# Patient Record
Sex: Male | Born: 1950 | Race: White | Hispanic: No | Marital: Married | Smoking: Former smoker
Health system: Southern US, Community
[De-identification: ages and names within clinical notes are randomized; demographics above are authoritative.]

## PROBLEM LIST (undated history)

## (undated) DIAGNOSIS — J849 Interstitial pulmonary disease, unspecified: Secondary | ICD-10-CM

## (undated) DIAGNOSIS — I639 Cerebral infarction, unspecified: Secondary | ICD-10-CM

## (undated) DIAGNOSIS — I10 Essential (primary) hypertension: Secondary | ICD-10-CM

## (undated) DIAGNOSIS — K219 Gastro-esophageal reflux disease without esophagitis: Secondary | ICD-10-CM

## (undated) DIAGNOSIS — L709 Acne, unspecified: Secondary | ICD-10-CM

## (undated) DIAGNOSIS — M1990 Unspecified osteoarthritis, unspecified site: Secondary | ICD-10-CM

## (undated) DIAGNOSIS — C4490 Unspecified malignant neoplasm of skin, unspecified: Secondary | ICD-10-CM

## (undated) DIAGNOSIS — E119 Type 2 diabetes mellitus without complications: Secondary | ICD-10-CM

## (undated) DIAGNOSIS — J45909 Unspecified asthma, uncomplicated: Secondary | ICD-10-CM

## (undated) DIAGNOSIS — I2510 Atherosclerotic heart disease of native coronary artery without angina pectoris: Secondary | ICD-10-CM

## (undated) DIAGNOSIS — E785 Hyperlipidemia, unspecified: Secondary | ICD-10-CM

## (undated) HISTORY — DX: Unspecified osteoarthritis, unspecified site: M19.90

## (undated) HISTORY — DX: Type 2 diabetes mellitus without complications: E11.9

## (undated) HISTORY — DX: Atherosclerotic heart disease of native coronary artery without angina pectoris: I25.10

## (undated) HISTORY — PX: CORONARY ANGIOPLASTY: SHX604

## (undated) HISTORY — DX: Gastro-esophageal reflux disease without esophagitis: K21.9

## (undated) HISTORY — DX: Hyperlipidemia, unspecified: E78.5

## (undated) HISTORY — DX: Essential (primary) hypertension: I10

## (undated) HISTORY — PX: TONSILLECTOMY: SUR1361

## (undated) HISTORY — PX: COLONOSCOPY: SHX174

## (undated) HISTORY — DX: Acne, unspecified: L70.9

## (undated) HISTORY — DX: Unspecified asthma, uncomplicated: J45.909

## (undated) HISTORY — PX: NOSE SURGERY: SHX723

---

## 1991-03-13 DIAGNOSIS — Z9861 Coronary angioplasty status: Secondary | ICD-10-CM

## 1991-03-13 HISTORY — PX: CORONARY ANGIOPLASTY: SHX604

## 1991-03-13 HISTORY — DX: Coronary angioplasty status: Z98.61

## 2000-04-08 ENCOUNTER — Ambulatory Visit (HOSPITAL_COMMUNITY): Admission: RE | Admit: 2000-04-08 | Discharge: 2000-04-08 | Payer: Self-pay | Admitting: Gastroenterology

## 2000-04-08 ENCOUNTER — Encounter (INDEPENDENT_AMBULATORY_CARE_PROVIDER_SITE_OTHER): Payer: Self-pay | Admitting: Specialist

## 2006-10-05 ENCOUNTER — Encounter: Admission: RE | Admit: 2006-10-05 | Discharge: 2006-10-05 | Payer: Self-pay | Admitting: Family Medicine

## 2009-12-07 ENCOUNTER — Emergency Department (HOSPITAL_COMMUNITY): Admission: EM | Admit: 2009-12-07 | Discharge: 2009-12-07 | Payer: Self-pay | Admitting: Emergency Medicine

## 2010-04-02 ENCOUNTER — Encounter: Payer: Self-pay | Admitting: Family Medicine

## 2010-05-25 LAB — BASIC METABOLIC PANEL
Chloride: 101 mEq/L (ref 96–112)
GFR calc Af Amer: >60 mL/min (ref >60)
Potassium: 4.3 mEq/L (ref 3.5–5.1)

## 2010-05-25 LAB — CBC
HCT: 47.9 % (ref 39.0–52.0)
MCV: 93.2 fL (ref 78.0–100.0)
RBC: 5.14 MIL/uL (ref 4.22–5.81)
WBC: 14.5 10*3/uL — ABNORMAL HIGH (ref 4.0–10.5)

## 2010-05-25 LAB — DIFFERENTIAL
Eosinophils Relative: 0 % (ref 0–5)
Lymphocytes Relative: 16 % (ref 12–46)
Lymphs Abs: 2.4 10*3/uL (ref 0.7–4.0)
Monocytes Absolute: 0.6 10*3/uL (ref 0.1–1.0)

## 2010-07-28 NOTE — Procedures (Signed)
[place]  Patient:    Christopher Oconnell, Christopher Oconnell                   MRN: [id number] Proc. Date: 04/08/00 Attending:  Petra Kuba, M.D. CC:         Darden Palmer., M.D.  Abran Cantor. Clovis Riley, MD   Procedure Report  PROCEDURE PERFORMED:  Esophagogastroduodenoscopy.  ENDOSCOPIST:  Petra Kuba, M.D.  INDICATIONS FOR PROCEDURE:  Guaiac positivity.  Nondiagnostic colonoscopy. Consent was signed after risks, benefits, methods, and options were thoroughly discussed in the office prior to this procedure before any premeds given.  MEDICATIONS USED:  Additional medicines for this procedure were Demerol 20 mg, Versed 3.  DESCRIPTION OF PROCEDURE:  Video endoscope was inserted by direct vision.  The esophagus was normal.  Scope was inserted into the stomach, advanced through a normal pylorus into a normal duodenal bulb and around the C-loop to a normal second and probably a third part of the duodenum.  Scope was slowly withdrawn back to the bulb.  No findings were seen in the upper duodenum.  Scope was withdrawn back to the stomach and retroflexed. High in the cardia, a tiny hiatal hernia was confirmed.  The angularis, fundus, lesser and greater curve on retroflexion was normal.  Scope was straightened and straight visualization of the stomach did reveal some minimal gastritis but no other abnormality. The scope was then slowly withdrawn.  Again, the tiny hiatal hernia was confirmed.  The esophagus was normal.  The scope was removed.  The patient tolerated the procedure well.  There was no evidence of any complication.  ENDOSCOPIC DIAGNOSIS: 1. Tiny hiatal hernia. 2. Minimal gastritis. 3. Otherwise normal esophagogastroduodenoscopy to the second or third part    of the duodenum.  PLAN:  Follow-up p.r.n. or in six to eight weeks to recheck guaiacs off aspirin and nonsteroidals and make sure no further work-up plans are needed and recheck symptoms  as well. DD:  04/08/00 TD:  04/08/00 Job: [id number]

## 2010-11-23 ENCOUNTER — Inpatient Hospital Stay (HOSPITAL_BASED_OUTPATIENT_CLINIC_OR_DEPARTMENT_OTHER)
Admission: RE | Admit: 2010-11-23 | Discharge: 2010-11-23 | Disposition: A | Discharge status: Home / Self Care | Payer: BC Managed Care – PPO | Source: Ambulatory Visit | Attending: Cardiology | Admitting: Cardiology

## 2010-11-23 DIAGNOSIS — R0989 Other specified symptoms and signs involving the circulatory and respiratory systems: Secondary | ICD-10-CM | POA: Insufficient documentation

## 2010-11-23 DIAGNOSIS — R079 Chest pain, unspecified: Secondary | ICD-10-CM | POA: Insufficient documentation

## 2010-11-23 DIAGNOSIS — R0609 Other forms of dyspnea: Secondary | ICD-10-CM | POA: Insufficient documentation

## 2010-11-23 DIAGNOSIS — I2510 Atherosclerotic heart disease of native coronary artery without angina pectoris: Secondary | ICD-10-CM | POA: Insufficient documentation

## 2010-11-23 DIAGNOSIS — R5381 Other malaise: Secondary | ICD-10-CM | POA: Insufficient documentation

## 2010-11-29 ENCOUNTER — Inpatient Hospital Stay (HOSPITAL_BASED_OUTPATIENT_CLINIC_OR_DEPARTMENT_OTHER): Admission: RE | Admit: 2010-11-29 | Payer: Self-pay | Source: Ambulatory Visit | Admitting: Cardiology

## 2010-12-13 NOTE — Cardiovascular Report (Signed)
  Christopher Oconnell, Christopher Oconnell NO.:  [id number]  MEDICAL RECORD NO.:  [id number]  LOCATION:                                 FACILITY:  PHYSICIAN:  Georga Hacking, M.D.DATE OF BIRTH:  Dec 22, 1950  DATE OF PROCEDURE:  11/23/2010                            CARDIAC CATHETERIZATION   HISTORY:  A 60 year old male has had dyspnea and some chest burning.  He developed worsening dyspnea on exertion, malaise and fatigue, and mild chest burning occurring at rest and relieved with antacids.  Does not have exertional chest tightness.  Was brought in for catheterization.  PROCEDURE:  Left heart catheterization with coronary angiogram and left ventriculogram.  DESCRIPTION OF PROCEDURE:  The patient was brought to the outpatient diagnostic laboratory and was prepped and draped in usual manner.  After Xylocaine anesthesia, a 4-French sheath was placed in the right femoral percutaneously with a single anterior needle wall stick.  Angiograms were made using 4-French catheters and ventriculogram was performed.  He had sheath removed in the holding area and was stable without complications.  HEMODYNAMIC DATA:  Aorta postcontrast 131/67, LV postcontrast 131/11-16.  ANGIOGRAPHIC DATA:  Left ventriculogram:  Performed in the 30-degree RAO projection.  The aortic valve is normal.  The mitral valve is normal. Left ventricle is normal in size.  Estimated ejection fraction is 60%.  Coronary arteries:  Arise and distribute normally.  There is mild calcification noted in the left coronary system.  The left main coronary artery is normal.  Left anterior descending has calcification and mild irregularities proximally, but no significant obstructive stenoses are noted.  The diagonal branch has some disease noted in the small branch of a twin diagonal branch.  The LAD terminates prior to the anterior wall and the diagonal branch appears to be at least the same size as the LAD.  Circumflex  coronary artery:  Calcification proximally.  First marginal branch has mild 40% proximal stenosis.  Circumflex has a segmental eccentric 60% stenosis after the continuation branch of the first marginal branch, which then bifurcates distally.  The right coronary artery is dominant vessel and contains minimal scattered irregularities.  IMPRESSION: 1. Long-term patency of the stented sites to the diagonal branch. 2. Moderately severe stenosis involving the circumflex marginal     branch. 3. Normal left ventricular function.  RECOMMENDATIONS:  Intensive medical therapy at this time, consider stress testing to determine if has ST depression and to assess exercise capacity.     Georga Hacking, M.D.     WST/MEDQ  D:  11/23/2010  T:  11/23/2010  Job:  [id number]  cc:   L. Lupe Carney, M.D.  Electronically Signed by Lacretia Nicks. Donnie Aho M.D. on 12/13/2010 12:37:56 PM

## 2011-08-03 ENCOUNTER — Other Ambulatory Visit: Payer: Self-pay | Admitting: Cardiology

## 2011-11-02 ENCOUNTER — Other Ambulatory Visit: Payer: Self-pay | Admitting: Cardiology

## 2013-09-16 ENCOUNTER — Ambulatory Visit
Admission: RE | Admit: 2013-09-16 | Discharge: 2013-09-16 | Disposition: A | Discharge status: Home / Self Care | Payer: 59 | Source: Ambulatory Visit | Attending: Family Medicine | Admitting: Family Medicine

## 2013-09-16 ENCOUNTER — Other Ambulatory Visit: Payer: Self-pay | Admitting: Family Medicine

## 2013-09-16 DIAGNOSIS — R059 Cough, unspecified: Secondary | ICD-10-CM

## 2013-09-16 DIAGNOSIS — R05 Cough: Secondary | ICD-10-CM

## 2013-09-16 DIAGNOSIS — R200 Anesthesia of skin: Secondary | ICD-10-CM

## 2013-09-22 ENCOUNTER — Ambulatory Visit
Admission: RE | Admit: 2013-09-22 | Discharge: 2013-09-22 | Disposition: A | Discharge status: Home / Self Care | Payer: 59 | Source: Ambulatory Visit | Attending: Family Medicine | Admitting: Family Medicine

## 2013-09-22 DIAGNOSIS — R200 Anesthesia of skin: Secondary | ICD-10-CM

## 2013-10-29 ENCOUNTER — Encounter: Payer: BC Managed Care – PPO | Admitting: Neurology

## 2015-03-29 ENCOUNTER — Other Ambulatory Visit: Payer: Self-pay | Admitting: Family Medicine

## 2015-03-29 ENCOUNTER — Ambulatory Visit
Admission: RE | Admit: 2015-03-29 | Discharge: 2015-03-29 | Disposition: A | Discharge status: Home / Self Care | Payer: PPO | Source: Ambulatory Visit | Attending: Family Medicine | Admitting: Family Medicine

## 2015-03-29 DIAGNOSIS — I10 Essential (primary) hypertension: Secondary | ICD-10-CM | POA: Diagnosis not present

## 2015-03-29 DIAGNOSIS — R109 Unspecified abdominal pain: Secondary | ICD-10-CM

## 2015-03-29 DIAGNOSIS — E782 Mixed hyperlipidemia: Secondary | ICD-10-CM | POA: Diagnosis not present

## 2015-03-29 DIAGNOSIS — E119 Type 2 diabetes mellitus without complications: Secondary | ICD-10-CM | POA: Diagnosis not present

## 2015-05-09 ENCOUNTER — Other Ambulatory Visit: Payer: Self-pay | Admitting: Gastroenterology

## 2015-05-09 DIAGNOSIS — Z86010 Personal history of colonic polyps: Secondary | ICD-10-CM | POA: Diagnosis not present

## 2015-05-09 DIAGNOSIS — D122 Benign neoplasm of ascending colon: Secondary | ICD-10-CM | POA: Diagnosis not present

## 2015-05-09 DIAGNOSIS — D120 Benign neoplasm of cecum: Secondary | ICD-10-CM | POA: Diagnosis not present

## 2015-05-09 DIAGNOSIS — D124 Benign neoplasm of descending colon: Secondary | ICD-10-CM | POA: Diagnosis not present

## 2015-05-09 DIAGNOSIS — D125 Benign neoplasm of sigmoid colon: Secondary | ICD-10-CM | POA: Diagnosis not present

## 2015-05-09 DIAGNOSIS — K621 Rectal polyp: Secondary | ICD-10-CM | POA: Diagnosis not present

## 2015-06-21 DIAGNOSIS — I2510 Atherosclerotic heart disease of native coronary artery without angina pectoris: Secondary | ICD-10-CM | POA: Diagnosis not present

## 2015-06-21 DIAGNOSIS — Z9861 Coronary angioplasty status: Secondary | ICD-10-CM | POA: Diagnosis not present

## 2015-06-21 DIAGNOSIS — E119 Type 2 diabetes mellitus without complications: Secondary | ICD-10-CM | POA: Diagnosis not present

## 2015-06-21 DIAGNOSIS — E1142 Type 2 diabetes mellitus with diabetic polyneuropathy: Secondary | ICD-10-CM | POA: Diagnosis not present

## 2015-06-21 DIAGNOSIS — E668 Other obesity: Secondary | ICD-10-CM | POA: Diagnosis not present

## 2015-06-21 DIAGNOSIS — E785 Hyperlipidemia, unspecified: Secondary | ICD-10-CM | POA: Diagnosis not present

## 2015-06-21 DIAGNOSIS — I10 Essential (primary) hypertension: Secondary | ICD-10-CM | POA: Diagnosis not present

## 2015-09-03 IMAGING — CR DG CHEST 2V
2 series · 2 of 2 positions shown · non-contrast
Comparison: November 16, 2010

CLINICAL DATA: Cough

EXAM:
CHEST  2 VIEW

[w chest pa]
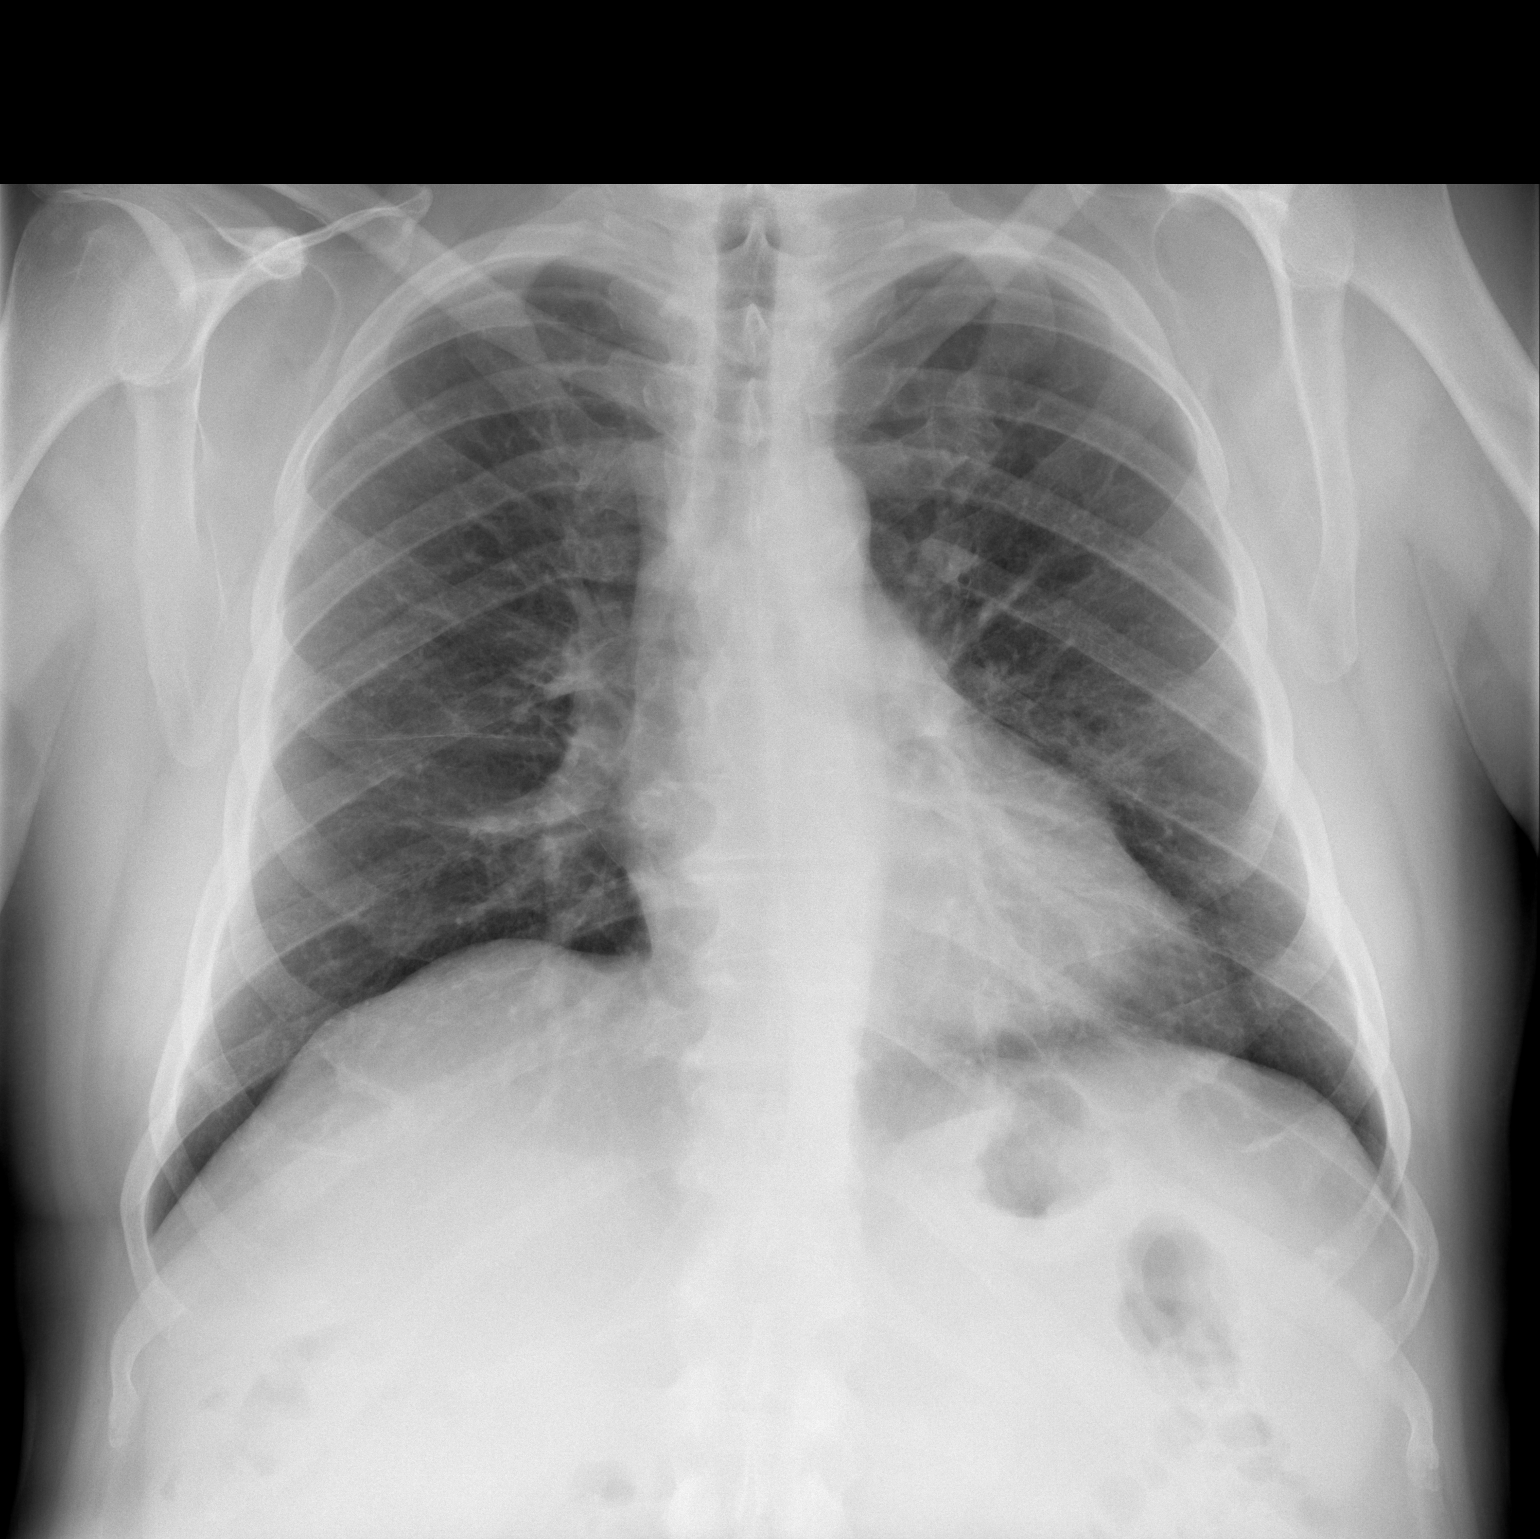

[w chest lat]
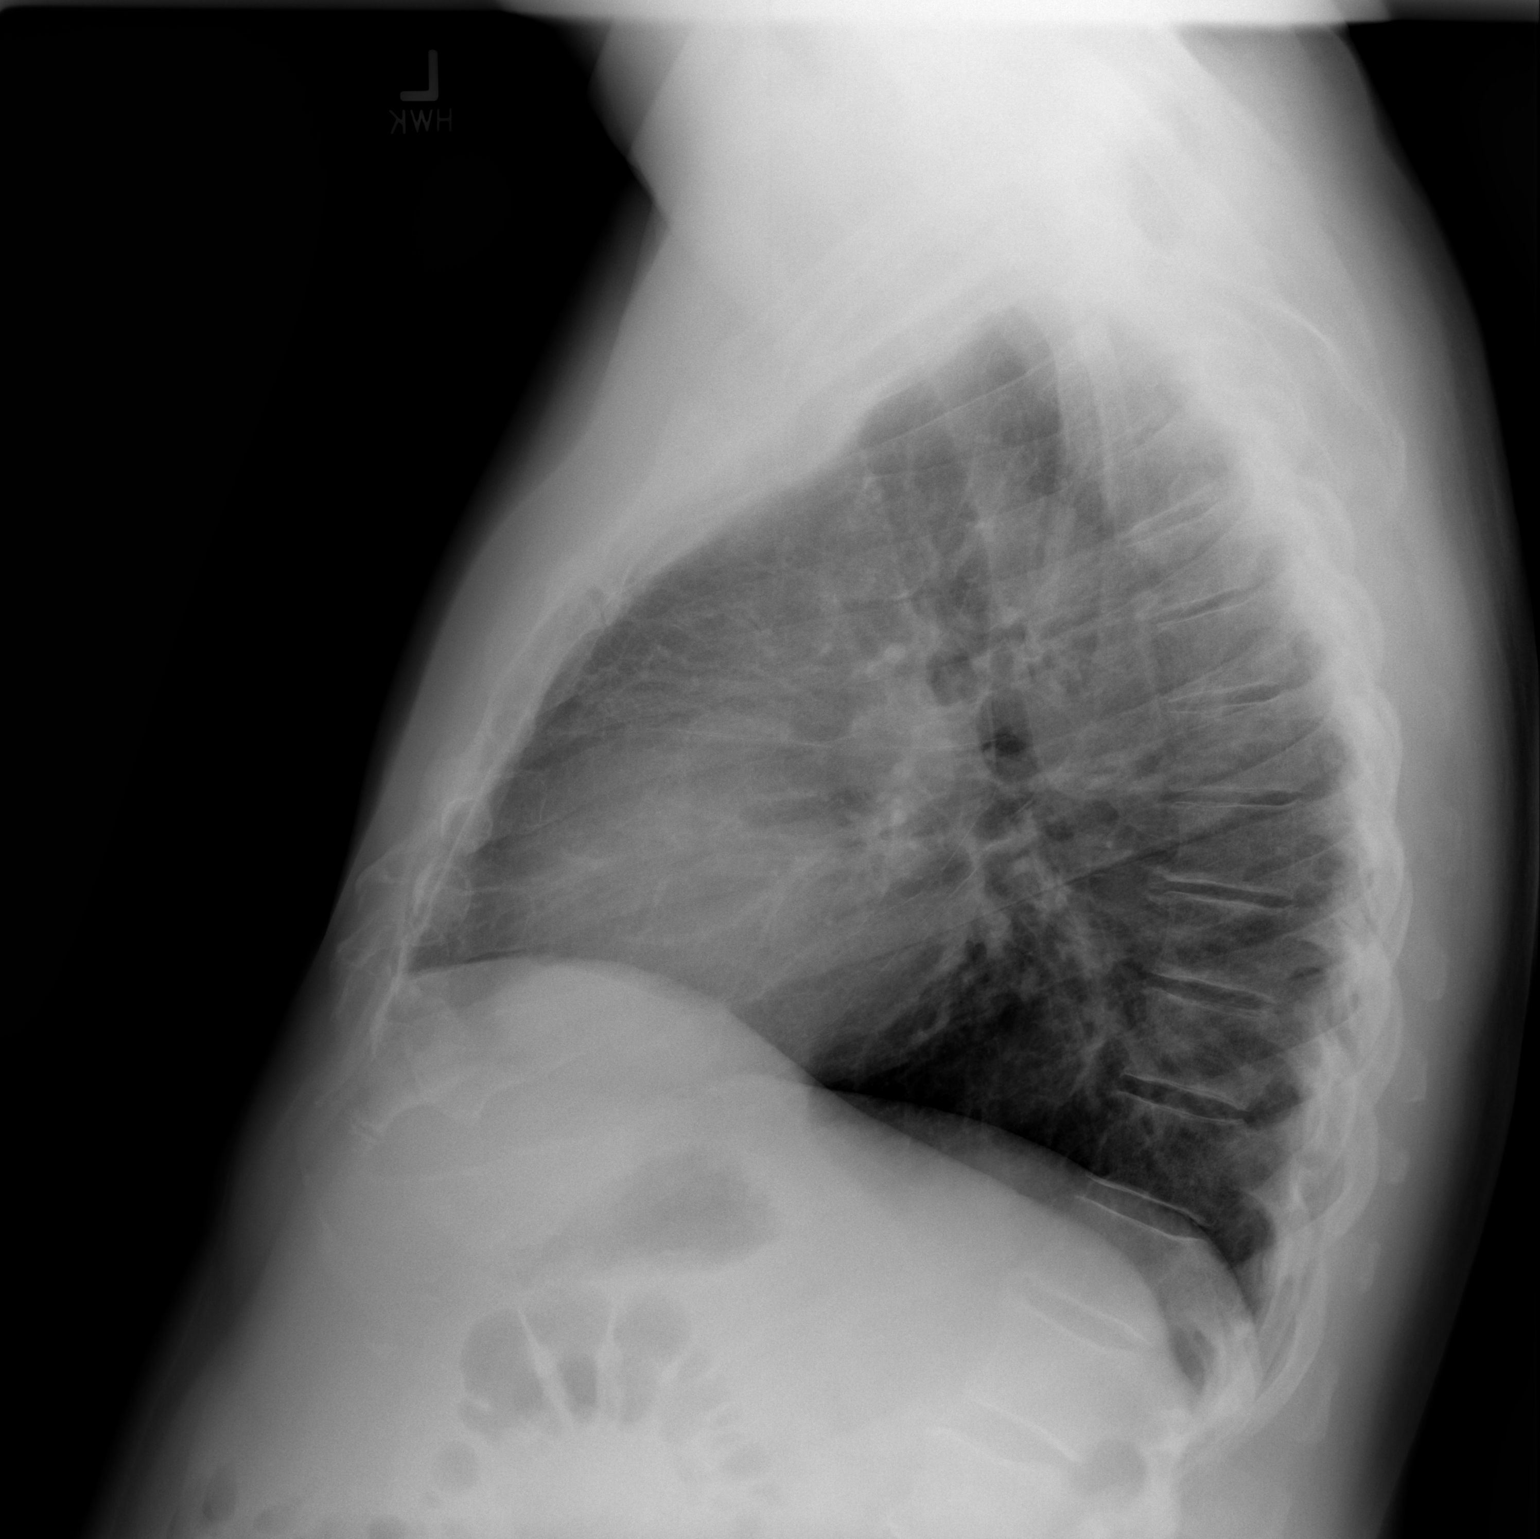

[2 of 2 positions shown; findings below may reference images not displayed]

FINDINGS: There is no edema or consolidation. The heart size and pulmonary
vascularity are normal. No adenopathy. No bone lesions.
IMPRESSION: No edema or consolidation.

## 2015-10-21 DIAGNOSIS — E119 Type 2 diabetes mellitus without complications: Secondary | ICD-10-CM | POA: Diagnosis not present

## 2015-10-21 DIAGNOSIS — Z125 Encounter for screening for malignant neoplasm of prostate: Secondary | ICD-10-CM | POA: Diagnosis not present

## 2015-10-21 DIAGNOSIS — I10 Essential (primary) hypertension: Secondary | ICD-10-CM | POA: Diagnosis not present

## 2015-10-21 DIAGNOSIS — N529 Male erectile dysfunction, unspecified: Secondary | ICD-10-CM | POA: Diagnosis not present

## 2015-10-21 DIAGNOSIS — E782 Mixed hyperlipidemia: Secondary | ICD-10-CM | POA: Diagnosis not present

## 2015-10-21 DIAGNOSIS — Z0000 Encounter for general adult medical examination without abnormal findings: Secondary | ICD-10-CM | POA: Diagnosis not present

## 2015-10-21 DIAGNOSIS — E669 Obesity, unspecified: Secondary | ICD-10-CM | POA: Diagnosis not present

## 2015-12-20 DIAGNOSIS — N5201 Erectile dysfunction due to arterial insufficiency: Secondary | ICD-10-CM | POA: Diagnosis not present

## 2016-03-29 DIAGNOSIS — N5201 Erectile dysfunction due to arterial insufficiency: Secondary | ICD-10-CM | POA: Diagnosis not present

## 2016-03-29 DIAGNOSIS — Z125 Encounter for screening for malignant neoplasm of prostate: Secondary | ICD-10-CM | POA: Diagnosis not present

## 2016-04-26 DIAGNOSIS — I10 Essential (primary) hypertension: Secondary | ICD-10-CM | POA: Diagnosis not present

## 2016-04-26 DIAGNOSIS — E119 Type 2 diabetes mellitus without complications: Secondary | ICD-10-CM | POA: Diagnosis not present

## 2016-04-26 DIAGNOSIS — E782 Mixed hyperlipidemia: Secondary | ICD-10-CM | POA: Diagnosis not present

## 2016-04-26 DIAGNOSIS — K5900 Constipation, unspecified: Secondary | ICD-10-CM | POA: Diagnosis not present

## 2016-06-19 DIAGNOSIS — I10 Essential (primary) hypertension: Secondary | ICD-10-CM | POA: Diagnosis not present

## 2016-06-19 DIAGNOSIS — I2510 Atherosclerotic heart disease of native coronary artery without angina pectoris: Secondary | ICD-10-CM | POA: Diagnosis not present

## 2016-06-19 DIAGNOSIS — E668 Other obesity: Secondary | ICD-10-CM | POA: Diagnosis not present

## 2016-06-19 DIAGNOSIS — E1142 Type 2 diabetes mellitus with diabetic polyneuropathy: Secondary | ICD-10-CM | POA: Diagnosis not present

## 2016-06-19 DIAGNOSIS — Z9861 Coronary angioplasty status: Secondary | ICD-10-CM | POA: Diagnosis not present

## 2016-06-19 DIAGNOSIS — E785 Hyperlipidemia, unspecified: Secondary | ICD-10-CM | POA: Diagnosis not present

## 2016-08-13 DIAGNOSIS — H52223 Regular astigmatism, bilateral: Secondary | ICD-10-CM | POA: Diagnosis not present

## 2016-08-13 DIAGNOSIS — I10 Essential (primary) hypertension: Secondary | ICD-10-CM | POA: Diagnosis not present

## 2016-08-13 DIAGNOSIS — H35033 Hypertensive retinopathy, bilateral: Secondary | ICD-10-CM | POA: Diagnosis not present

## 2016-08-13 DIAGNOSIS — H524 Presbyopia: Secondary | ICD-10-CM | POA: Diagnosis not present

## 2016-08-13 DIAGNOSIS — E119 Type 2 diabetes mellitus without complications: Secondary | ICD-10-CM | POA: Diagnosis not present

## 2016-10-09 DIAGNOSIS — K5900 Constipation, unspecified: Secondary | ICD-10-CM | POA: Diagnosis not present

## 2016-10-09 DIAGNOSIS — R140 Abdominal distension (gaseous): Secondary | ICD-10-CM | POA: Diagnosis not present

## 2016-10-09 DIAGNOSIS — K210 Gastro-esophageal reflux disease with esophagitis: Secondary | ICD-10-CM | POA: Diagnosis not present

## 2016-11-06 DIAGNOSIS — E669 Obesity, unspecified: Secondary | ICD-10-CM | POA: Diagnosis not present

## 2016-11-06 DIAGNOSIS — Z7984 Long term (current) use of oral hypoglycemic drugs: Secondary | ICD-10-CM | POA: Diagnosis not present

## 2016-11-06 DIAGNOSIS — E782 Mixed hyperlipidemia: Secondary | ICD-10-CM | POA: Diagnosis not present

## 2016-11-06 DIAGNOSIS — E1165 Type 2 diabetes mellitus with hyperglycemia: Secondary | ICD-10-CM | POA: Diagnosis not present

## 2016-11-06 DIAGNOSIS — K210 Gastro-esophageal reflux disease with esophagitis: Secondary | ICD-10-CM | POA: Diagnosis not present

## 2016-11-06 DIAGNOSIS — Z1159 Encounter for screening for other viral diseases: Secondary | ICD-10-CM | POA: Diagnosis not present

## 2016-11-06 DIAGNOSIS — I10 Essential (primary) hypertension: Secondary | ICD-10-CM | POA: Diagnosis not present

## 2016-11-06 DIAGNOSIS — Z0000 Encounter for general adult medical examination without abnormal findings: Secondary | ICD-10-CM | POA: Diagnosis not present

## 2016-11-06 LAB — HM HEPATITIS C SCREENING LAB: HM Hepatitis Screen: NEGATIVE

## 2016-11-13 DIAGNOSIS — R140 Abdominal distension (gaseous): Secondary | ICD-10-CM | POA: Diagnosis not present

## 2016-11-13 DIAGNOSIS — K5900 Constipation, unspecified: Secondary | ICD-10-CM | POA: Diagnosis not present

## 2016-11-13 DIAGNOSIS — K210 Gastro-esophageal reflux disease with esophagitis: Secondary | ICD-10-CM | POA: Diagnosis not present

## 2017-01-30 ENCOUNTER — Encounter: Payer: Self-pay | Admitting: Family Medicine

## 2017-01-30 DIAGNOSIS — R1084 Generalized abdominal pain: Secondary | ICD-10-CM | POA: Diagnosis not present

## 2017-01-30 DIAGNOSIS — K319 Disease of stomach and duodenum, unspecified: Secondary | ICD-10-CM | POA: Diagnosis not present

## 2017-01-30 DIAGNOSIS — K2950 Unspecified chronic gastritis without bleeding: Secondary | ICD-10-CM | POA: Diagnosis not present

## 2017-02-05 DIAGNOSIS — K319 Disease of stomach and duodenum, unspecified: Secondary | ICD-10-CM | POA: Diagnosis not present

## 2017-03-15 IMAGING — CR DG ABDOMEN 2V
3 series · 3 of 3 positions shown · non-contrast
Comparison: None.

CLINICAL DATA: Recurrent abdominal pain

EXAM:
ABDOMEN - 2 VIEW

[view not recorded (1 of 3)]
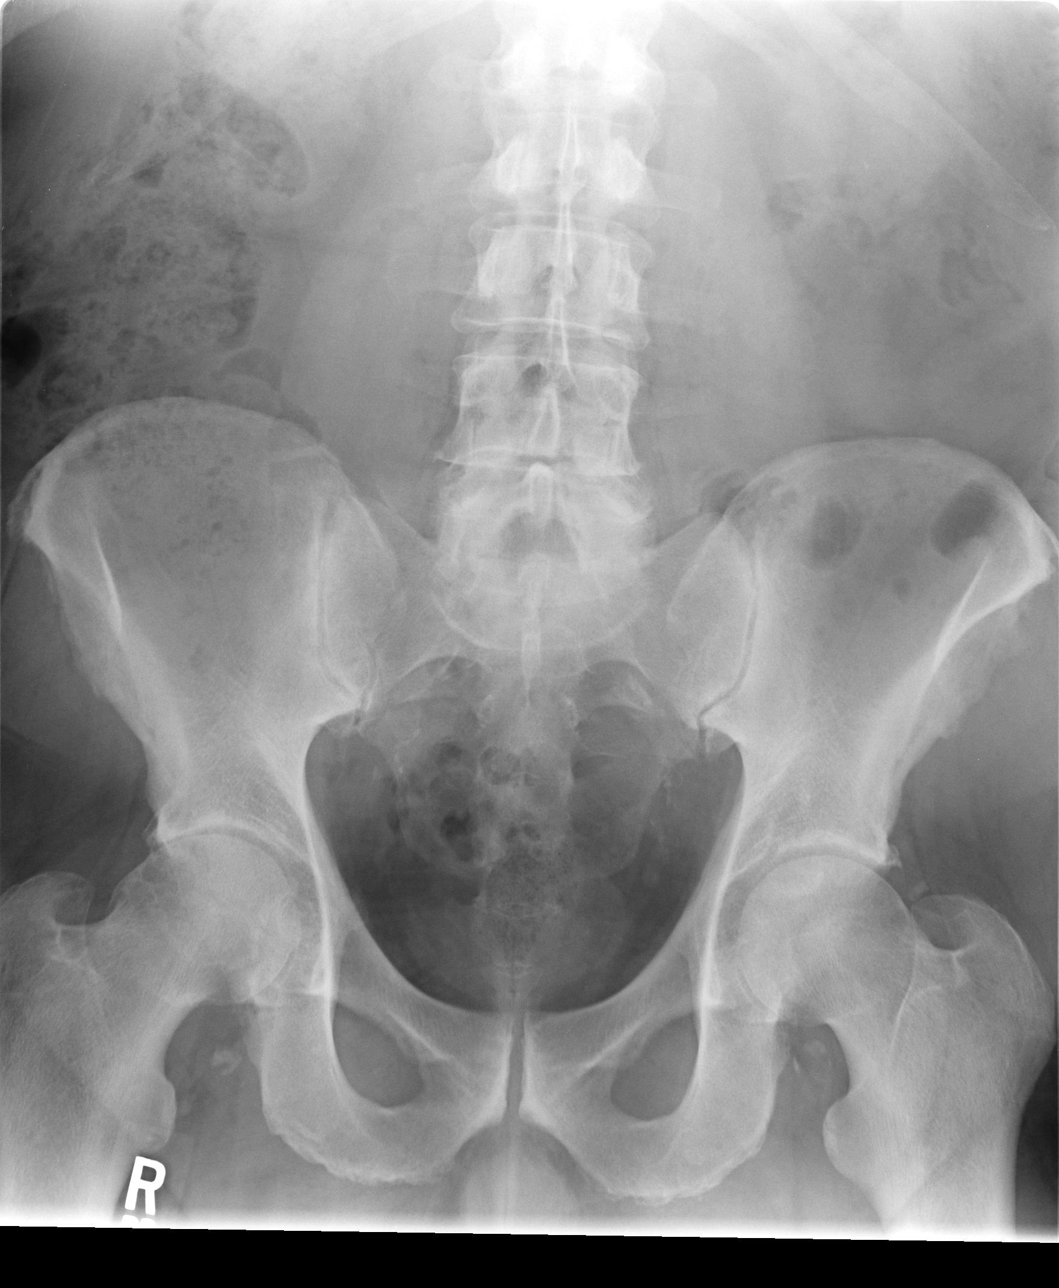

[view not recorded (2 of 3)]
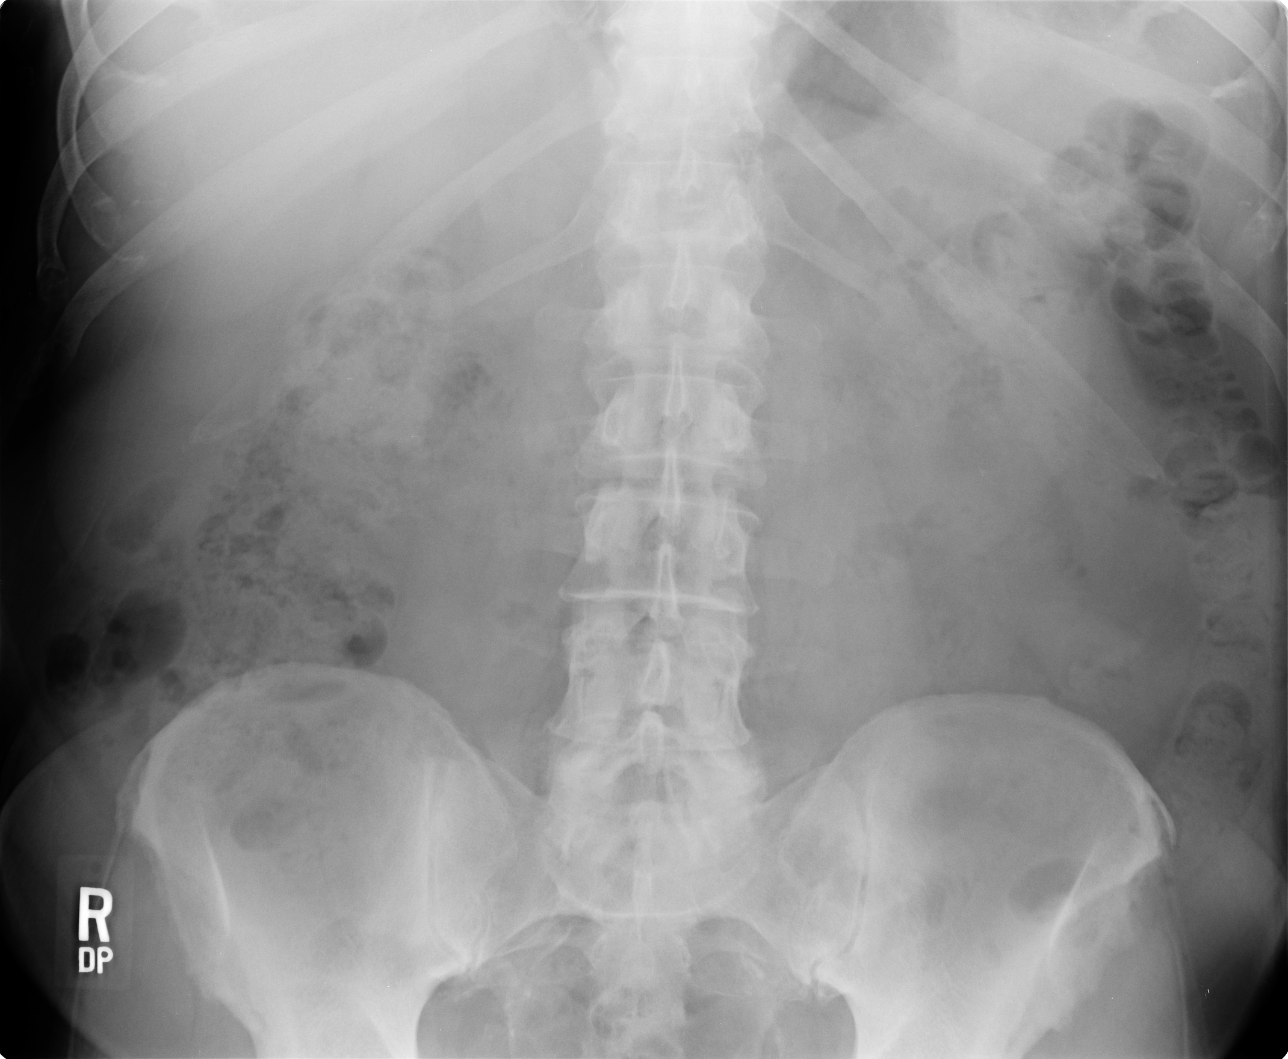

[view not recorded (3 of 3)]
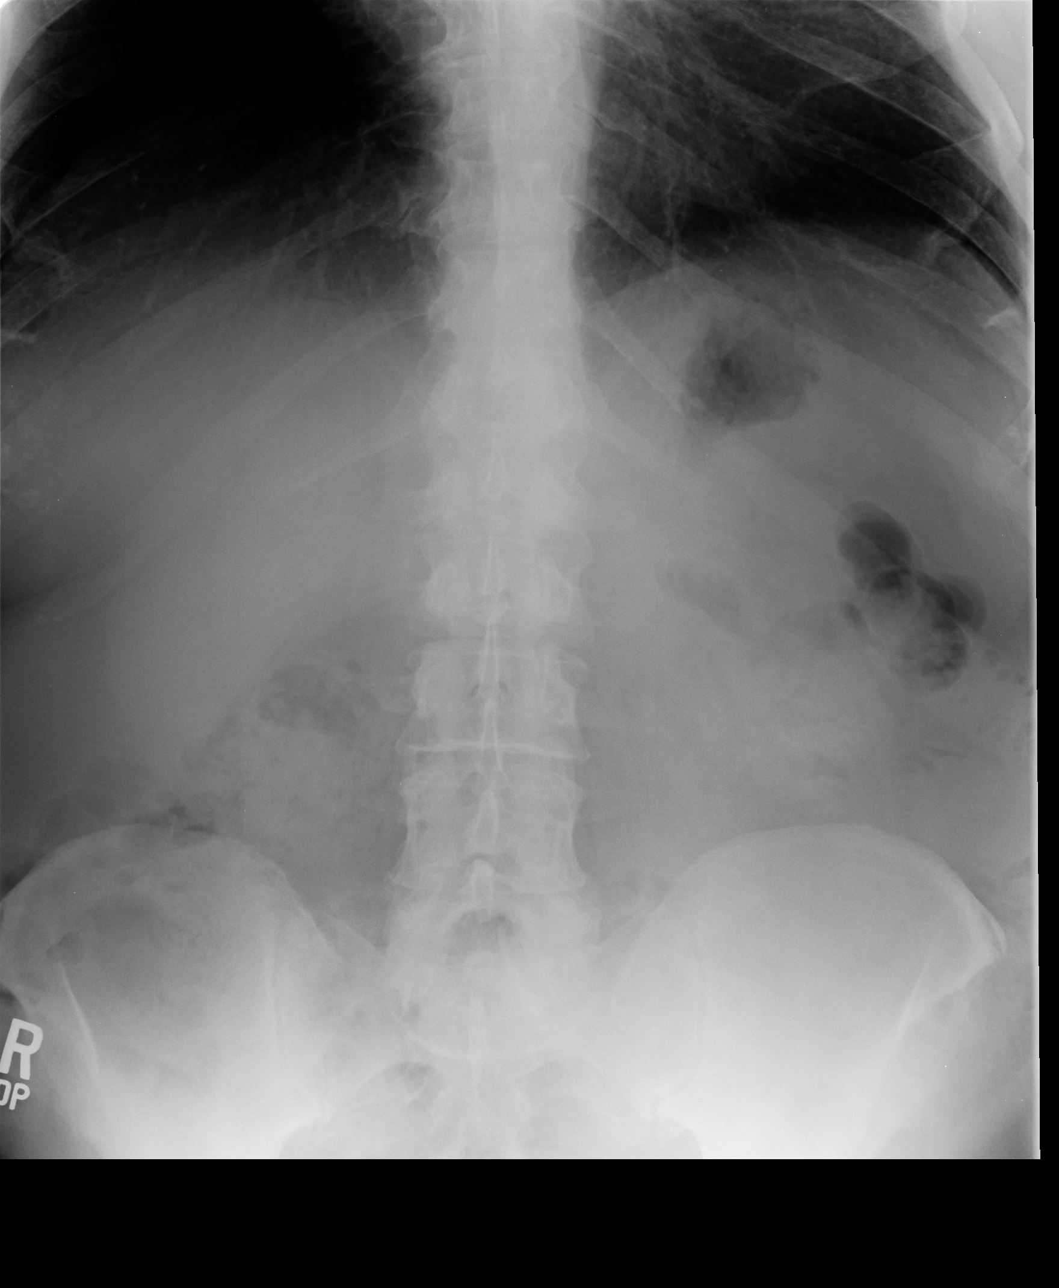

[3 of 3 positions shown; findings below may reference images not displayed]

FINDINGS: There is a moderate amount of stool in the ascending colon. There is
no bowel dilatation to suggest obstruction. There is no evidence of
pneumoperitoneum, portal venous gas or pneumatosis.

There are no pathologic calcifications along the expected course of
the ureters.

There is moderate osteoarthritis of the right hip. There is
peripheral vascular atherosclerotic disease.
IMPRESSION: Moderate amount of stool in the ascending colon.

## 2017-05-13 DIAGNOSIS — E1140 Type 2 diabetes mellitus with diabetic neuropathy, unspecified: Secondary | ICD-10-CM | POA: Diagnosis not present

## 2017-05-13 DIAGNOSIS — K5900 Constipation, unspecified: Secondary | ICD-10-CM | POA: Diagnosis not present

## 2017-05-13 DIAGNOSIS — I10 Essential (primary) hypertension: Secondary | ICD-10-CM | POA: Diagnosis not present

## 2017-05-13 DIAGNOSIS — K210 Gastro-esophageal reflux disease with esophagitis: Secondary | ICD-10-CM | POA: Diagnosis not present

## 2017-05-13 DIAGNOSIS — E782 Mixed hyperlipidemia: Secondary | ICD-10-CM | POA: Diagnosis not present

## 2017-05-13 DIAGNOSIS — D489 Neoplasm of uncertain behavior, unspecified: Secondary | ICD-10-CM | POA: Diagnosis not present

## 2017-05-30 ENCOUNTER — Other Ambulatory Visit: Payer: Self-pay

## 2017-05-30 DIAGNOSIS — D229 Melanocytic nevi, unspecified: Secondary | ICD-10-CM | POA: Diagnosis not present

## 2017-05-30 DIAGNOSIS — D2261 Melanocytic nevi of right upper limb, including shoulder: Secondary | ICD-10-CM | POA: Diagnosis not present

## 2017-05-30 DIAGNOSIS — C44629 Squamous cell carcinoma of skin of left upper limb, including shoulder: Secondary | ICD-10-CM | POA: Diagnosis not present

## 2017-05-30 DIAGNOSIS — D485 Neoplasm of uncertain behavior of skin: Secondary | ICD-10-CM | POA: Diagnosis not present

## 2017-05-30 DIAGNOSIS — L570 Actinic keratosis: Secondary | ICD-10-CM | POA: Diagnosis not present

## 2017-05-30 DIAGNOSIS — D225 Melanocytic nevi of trunk: Secondary | ICD-10-CM | POA: Diagnosis not present

## 2017-06-20 ENCOUNTER — Other Ambulatory Visit: Payer: Self-pay | Admitting: Dermatology

## 2017-06-20 DIAGNOSIS — D485 Neoplasm of uncertain behavior of skin: Secondary | ICD-10-CM | POA: Diagnosis not present

## 2017-06-20 DIAGNOSIS — L988 Other specified disorders of the skin and subcutaneous tissue: Secondary | ICD-10-CM | POA: Diagnosis not present

## 2017-06-21 DIAGNOSIS — I2510 Atherosclerotic heart disease of native coronary artery without angina pectoris: Secondary | ICD-10-CM | POA: Diagnosis not present

## 2017-06-21 DIAGNOSIS — E785 Hyperlipidemia, unspecified: Secondary | ICD-10-CM | POA: Diagnosis not present

## 2017-06-21 DIAGNOSIS — Z9861 Coronary angioplasty status: Secondary | ICD-10-CM | POA: Diagnosis not present

## 2017-06-21 DIAGNOSIS — E668 Other obesity: Secondary | ICD-10-CM | POA: Diagnosis not present

## 2017-06-21 DIAGNOSIS — E1142 Type 2 diabetes mellitus with diabetic polyneuropathy: Secondary | ICD-10-CM | POA: Diagnosis not present

## 2017-06-21 DIAGNOSIS — I10 Essential (primary) hypertension: Secondary | ICD-10-CM | POA: Diagnosis not present

## 2017-08-30 DIAGNOSIS — H524 Presbyopia: Secondary | ICD-10-CM | POA: Diagnosis not present

## 2017-08-30 DIAGNOSIS — H0102A Squamous blepharitis right eye, upper and lower eyelids: Secondary | ICD-10-CM | POA: Diagnosis not present

## 2017-08-30 DIAGNOSIS — H52223 Regular astigmatism, bilateral: Secondary | ICD-10-CM | POA: Diagnosis not present

## 2017-08-30 DIAGNOSIS — E119 Type 2 diabetes mellitus without complications: Secondary | ICD-10-CM | POA: Diagnosis not present

## 2017-08-30 DIAGNOSIS — H5203 Hypermetropia, bilateral: Secondary | ICD-10-CM | POA: Diagnosis not present

## 2017-08-30 DIAGNOSIS — H0102B Squamous blepharitis left eye, upper and lower eyelids: Secondary | ICD-10-CM | POA: Diagnosis not present

## 2017-11-20 DIAGNOSIS — K5900 Constipation, unspecified: Secondary | ICD-10-CM | POA: Diagnosis not present

## 2017-11-20 DIAGNOSIS — E1140 Type 2 diabetes mellitus with diabetic neuropathy, unspecified: Secondary | ICD-10-CM | POA: Diagnosis not present

## 2017-11-20 DIAGNOSIS — S91309A Unspecified open wound, unspecified foot, initial encounter: Secondary | ICD-10-CM | POA: Diagnosis not present

## 2017-11-20 DIAGNOSIS — Z7984 Long term (current) use of oral hypoglycemic drugs: Secondary | ICD-10-CM | POA: Diagnosis not present

## 2017-11-20 DIAGNOSIS — E782 Mixed hyperlipidemia: Secondary | ICD-10-CM | POA: Diagnosis not present

## 2017-11-20 DIAGNOSIS — I10 Essential (primary) hypertension: Secondary | ICD-10-CM | POA: Diagnosis not present

## 2018-04-10 DIAGNOSIS — L570 Actinic keratosis: Secondary | ICD-10-CM | POA: Diagnosis not present

## 2018-05-22 DIAGNOSIS — I10 Essential (primary) hypertension: Secondary | ICD-10-CM | POA: Diagnosis not present

## 2018-05-22 DIAGNOSIS — E782 Mixed hyperlipidemia: Secondary | ICD-10-CM | POA: Diagnosis not present

## 2018-05-22 DIAGNOSIS — M2010 Hallux valgus (acquired), unspecified foot: Secondary | ICD-10-CM | POA: Diagnosis not present

## 2018-05-22 DIAGNOSIS — Z125 Encounter for screening for malignant neoplasm of prostate: Secondary | ICD-10-CM | POA: Diagnosis not present

## 2018-05-22 DIAGNOSIS — K5900 Constipation, unspecified: Secondary | ICD-10-CM | POA: Diagnosis not present

## 2018-05-22 DIAGNOSIS — E1140 Type 2 diabetes mellitus with diabetic neuropathy, unspecified: Secondary | ICD-10-CM | POA: Diagnosis not present

## 2018-05-22 DIAGNOSIS — I2510 Atherosclerotic heart disease of native coronary artery without angina pectoris: Secondary | ICD-10-CM | POA: Diagnosis not present

## 2018-05-22 LAB — LIPID PANEL
Cholesterol: 163 (ref 0–200)
HDL: 39 (ref 35–70)
LDL Cholesterol: 89
LDl/HDL Ratio: 4.2
Triglycerides: 176 — AB (ref 40–160)

## 2018-05-22 LAB — HEMOGLOBIN A1C: Hemoglobin A1C: 7.5

## 2018-05-22 LAB — BASIC METABOLIC PANEL
BUN: 12 (ref 4–21)
Creatinine: 1 (ref 0.6–1.3)
Glucose: 147
Potassium: 4.1 (ref 3.4–5.3)
Sodium: 136 — AB (ref 137–147)

## 2018-05-22 LAB — HEPATIC FUNCTION PANEL
ALT: 26 (ref 10–40)
AST: 22 (ref 14–40)

## 2018-05-22 LAB — PSA: PSA: 0.57

## 2018-06-12 ENCOUNTER — Ambulatory Visit: Payer: PPO | Admitting: Cardiology

## 2018-07-16 ENCOUNTER — Telehealth: Payer: Self-pay | Admitting: *Deleted

## 2018-07-16 NOTE — Telephone Encounter (Signed)
Patient scheduled for tomorrow with Buford Dresser. Need to reschedule to a different doctor.  Can be virtual if no current chest pain or symptoms.  No answer on first number and no VM. No answer. Left message to call back on second number listed. Scheduler Caryl Pina will be calling patient again today as well.

## 2018-07-17 ENCOUNTER — Ambulatory Visit: Payer: PPO | Admitting: Cardiology

## 2018-07-24 DIAGNOSIS — H4921 Sixth [abducent] nerve palsy, right eye: Secondary | ICD-10-CM | POA: Diagnosis not present

## 2018-08-21 DIAGNOSIS — E119 Type 2 diabetes mellitus without complications: Secondary | ICD-10-CM | POA: Diagnosis not present

## 2018-12-11 DIAGNOSIS — H4921 Sixth [abducent] nerve palsy, right eye: Secondary | ICD-10-CM | POA: Diagnosis not present

## 2019-02-11 ENCOUNTER — Encounter: Payer: Self-pay | Admitting: Internal Medicine

## 2019-02-11 ENCOUNTER — Other Ambulatory Visit: Payer: Self-pay

## 2019-02-11 ENCOUNTER — Telehealth: Payer: Self-pay | Admitting: Internal Medicine

## 2019-02-11 ENCOUNTER — Ambulatory Visit (INDEPENDENT_AMBULATORY_CARE_PROVIDER_SITE_OTHER): Payer: PPO | Admitting: Internal Medicine

## 2019-02-11 VITALS — BP 136/72 | HR 66 | Ht 70.0 in | Wt 217.5 lb

## 2019-02-11 DIAGNOSIS — E785 Hyperlipidemia, unspecified: Secondary | ICD-10-CM | POA: Insufficient documentation

## 2019-02-11 DIAGNOSIS — I10 Essential (primary) hypertension: Secondary | ICD-10-CM | POA: Insufficient documentation

## 2019-02-11 DIAGNOSIS — I25118 Atherosclerotic heart disease of native coronary artery with other forms of angina pectoris: Secondary | ICD-10-CM | POA: Insufficient documentation

## 2019-02-11 DIAGNOSIS — E1149 Type 2 diabetes mellitus with other diabetic neurological complication: Secondary | ICD-10-CM | POA: Insufficient documentation

## 2019-02-11 DIAGNOSIS — E119 Type 2 diabetes mellitus without complications: Secondary | ICD-10-CM | POA: Diagnosis not present

## 2019-02-11 DIAGNOSIS — I2510 Atherosclerotic heart disease of native coronary artery without angina pectoris: Secondary | ICD-10-CM

## 2019-02-11 NOTE — Telephone Encounter (Signed)
Faxed release request to [facility] for most recent labs .

## 2019-02-11 NOTE — Patient Instructions (Signed)
Medication Instructions:  Your physician recommends that you continue on your current medications as directed. Please refer to the Current Medication list given to you today.  *If you need a refill on your cardiac medications before your next appointment, please call your pharmacy*  Lab Work: Chilton Greathouse will be requested from your primary care physician.   If you have labs (blood work) drawn today and your tests are completely normal, you will receive your results only by: Marland Kitchen MyChart Message (if you have MyChart) OR . A paper copy in the mail If you have any lab test that is abnormal or we need to change your treatment, we will call you to review the results.  Testing/Procedures: NONE  Follow-Up: At [facility], you and your health needs are our priority.  As part of our continuing mission to provide you with exceptional heart care, we have created designated Provider Care Teams.  These Care Teams include your primary Cardiologist (physician) and Advanced Practice Providers (APPs -  Physician Assistants and Nurse Practitioners) who all work together to provide you with the care you need, when you need it.  Your next appointment:   12 month(s)  The format for your next appointment:   In Person  Provider:    You may see DR Harrell Gave END or one of the following Advanced Practice Providers on your designated Care Team:    Murray Hodgkins, NP  Christell Faith, PA-C  Marrianne Mood, PA-C

## 2019-02-11 NOTE — Progress Notes (Signed)
Follow-up Outpatient Visit Date: 02/11/2019  Primary Care Provider: Alroy Dust, L.Marlou Sa, [place] [address] [place] [address]  Chief Complaint: Follow-up coronary artery disease  HPI:  Christopher Oconnell is a 68 y.o. male previously followed by Dr. Wynonia Lawman with history of coronary artery disease status post angioplasty to D1 in 1993, hypertension, hyperlipidemia, and type 2 diabetes mellitus, who presents for follow-up of coronary artery disease.  He last saw Dr. Wynonia Lawman in 06/2017, at which time he was doing well.  Today, Christopher Oconnell reports that he has been without new symptoms.  He has mild exertional dyspnea that has been unchanged for years.  He denies chest pain, palpitations, lightheadedness, orthopnea, PND, and edema.  He is tolerating his current medications well.  Home blood pressures are typically in the 130s/70s.  --------------------------------------------------------------------------------------------------  Past Medical History:  Diagnosis Date  . Coronary artery disease   . Diabetes mellitus without complication ([place])   . Hyperlipidemia   . Hypertension    Past Surgical History:  Procedure Laterality Date  . CORONARY ANGIOPLASTY     No stents  . NOSE SURGERY    . TONSILLECTOMY      Current Meds  Medication Sig  . amLODipine (NORVASC) 5 MG tablet Take 5 mg by mouth daily.  Marland Kitchen aspirin EC 81 MG tablet Take 81 mg by mouth daily.  Marland Kitchen atorvastatin (LIPITOR) 80 MG tablet Take 80 mg by mouth daily.  Marland Kitchen CINNAMON PO Take 200 mg by mouth 2 (two) times daily.  . enalapril (VASOTEC) 20 MG tablet Take 20 mg by mouth 2 (two) times daily.  . Flaxseed, Linseed, (FLAXSEED OIL) 1200 MG CAPS Take by mouth daily.  . metFORMIN (GLUCOPHAGE) 500 MG tablet daily.  . Multiple Vitamin (MULTIVITAMIN) tablet Take 1 tablet by mouth daily.  . Omega-3 Fatty Acids (FISH OIL PO) Take 1,200 mg by mouth. Takes 1 tablet qd.  . Potassium Gluconate 550 MG TABS Take by mouth daily as  needed.  . triamterene-hydrochlorothiazide (MAXZIDE-25) 37.5-25 MG tablet Take 1 tablet by mouth every morning.  . Turmeric (QC TUMERIC COMPLEX PO) Take by mouth daily.    Allergies: Patient has no known allergies.  Social History   Tobacco Use  . Smoking status: Former Smoker    Types: Cigarettes    Quit date: 1993    Years since quitting: 27.9  . Smokeless tobacco: Never Used  Substance Use Topics  . Alcohol use: Yes    Alcohol/week: 12.0 standard drinks    Types: 10 Cans of beer, 2 Shots of liquor per week  . Drug use: Never    Family History  Problem Relation Age of Onset  . Coronary artery disease Father   . Melanoma Father   . Pancreatic cancer Father   . Breast cancer Sister     Review of Systems: A 12-system review of systems was performed and was negative except as noted in the HPI.  --------------------------------------------------------------------------------------------------  Physical Exam: BP 136/72 (BP Location: Right Arm, Patient Position: Sitting, Cuff Size: Normal)   Pulse 66   Ht 5\' 10"  (1.778 m)   Wt 217 lb 8 oz (98.7 kg)   SpO2 98%   BMI 31.21 kg/m   General: NAD. HEENT: No conjunctival pallor or scleral icterus. Facemask in place. Neck: Supple without lymphadenopathy, thyromegaly, JVD, or HJR. Lungs: Normal work of breathing. Clear to auscultation bilaterally without wheezes or crackles. Heart: Regular rate and rhythm without murmurs, rubs, or gallops. Non-displaced PMI. Abd: Bowel sounds present.  Soft, NT/ND without hepatosplenomegaly Ext: No lower extremity edema. Radial, PT, and DP pulses are 2+ bilaterally. Skin: Warm and dry without rash.  EKG: Normal sinus rhythm with nonspecific ST changes.  Lab Results  Component Value Date   WBC 14.5 (H) 12/07/2009   HGB 16.5 12/07/2009   HCT 47.9 12/07/2009   MCV 93.2 12/07/2009   PLT 206 12/07/2009    Lab Results  Component Value Date   NA 139 12/07/2009   K 4.3 12/07/2009   CL 101  12/07/2009   CO2 29 12/07/2009   BUN 11 12/07/2009   CREATININE 1.06 12/07/2009   GLUCOSE 161 (H) 12/07/2009    No results found for: CHOL, HDL, LDLCALC, LDLDIRECT, TRIG, CHOLHDL  --------------------------------------------------------------------------------------------------  ASSESSMENT AND PLAN: Coronary artery disease status post PTCA: Christopher Oconnell denies angina.  He has exertional dyspnea that has been stable for years.  We will continue his current medications for secondary prevention.  He will need a fasting lipid panel when he follows up with Dr. Alroy Dust early next year (goal LDL < 70).  Hyperlipidemia: Continue atorvastatin 80 mg daily for goal LDL < 70.  Lipid panel and LFT's to be checked when Christopher Oconnell follows up with his PCP early next year.  Hypertension: Blood pressure is mildly elevated (goal < 130/80 in the setting of DM and CAD).  Continue current doses of enalapril and triamterene-HCTZ.  We will increase amlodipine to 10 mg daily.  Low sodium diet encouraged.  Type 2 diabetes mellitus: Continue metformin, with routing follow-up with Dr. Alroy Dust.  Follow-up: Return to clinic in 1 year.  Nelva Bush, MD 02/11/2019 1:12 PM

## 2019-02-12 ENCOUNTER — Telehealth: Payer: Self-pay | Admitting: *Deleted

## 2019-02-12 MED ORDER — AMLODIPINE BESYLATE 10 MG PO TABS: 10.0000 mg | ORAL_TABLET | Freq: Every day | ORAL | 2 refills | Status: DC

## 2019-02-12 NOTE — Telephone Encounter (Signed)
Patient calling back and verbalized understanding to increase amlodipine to 10 mg daily. Rx sent to pharmacy.

## 2019-02-12 NOTE — Telephone Encounter (Signed)
No answer. Left message to call back.   

## 2019-02-12 NOTE — Telephone Encounter (Signed)
-----   Message from Nelva Bush, MD sent at 02/11/2019  8:10 PM EST ----- Hi [name],I think I had spoken with [name] about increasing his amlodipine to 10 mg daily but may have neglected to tell you.  Would you mind reaching out to him about this medication change.  Thanks and sorry for the trouble.Gerald Stabs

## 2019-02-17 NOTE — Addendum Note (Signed)
Addended by: Britt Bottom on: 02/17/2019 01:32 PM   Modules accepted: Orders

## 2019-04-24 ENCOUNTER — Telehealth: Payer: Self-pay | Admitting: *Deleted

## 2019-04-24 DIAGNOSIS — E785 Hyperlipidemia, unspecified: Secondary | ICD-10-CM

## 2019-04-24 DIAGNOSIS — I2510 Atherosclerotic heart disease of native coronary artery without angina pectoris: Secondary | ICD-10-CM

## 2019-04-24 NOTE — Telephone Encounter (Signed)
-----   Message from Nelva Bush, MD sent at 04/24/2019  9:35 AM EST ----- Please let the patient know that I have reviewed his labs, which are notable for suboptimal LDL and triglyceride control.  Since it has been almost a year that these labs were drawn (05/2018), I recommend that he have a fasting lipid panel and CMP at his earliest convenience (could be done through our office or his PCP).  Gerald Stabs ----- Message ----- From: Vanessa Ralphs, RN Sent: 04/23/2019   2:52 PM EST To: Nelva Bush, MD  You requested lab work when patient was here in December.

## 2019-04-24 NOTE — Telephone Encounter (Signed)
Patient agreeable to go to the [facility] at his convenience for the lab work. He is aware to be fasting.  He also said when Dr End increased his amlodipine to 10 mg daily he began to feel lightheaded at times. His PCP had also increased Enalapril to 20 mg two times a day. Patient reduced Enalapril to 20 mg once a day and continued the amlodipine 10 mg.  No further episodes of lightheadedness.  States BP yesterday was 126/68 and that's what it usually runs. Advised this should be fine and I will make Dr End aware. If any further instructions, then I will call him back. Advised him to monitor BP/HR and let us know if consistently greater than 140/90.

## 2019-04-27 ENCOUNTER — Other Ambulatory Visit
Admission: RE | Admit: 2019-04-27 | Discharge: 2019-04-27 | Disposition: A | Discharge status: Home / Self Care | Payer: PPO | Source: Ambulatory Visit | Attending: Internal Medicine | Admitting: Internal Medicine

## 2019-04-27 DIAGNOSIS — E785 Hyperlipidemia, unspecified: Secondary | ICD-10-CM | POA: Diagnosis not present

## 2019-04-27 DIAGNOSIS — I2510 Atherosclerotic heart disease of native coronary artery without angina pectoris: Secondary | ICD-10-CM | POA: Diagnosis not present

## 2019-04-27 LAB — LIPID PANEL
Cholesterol: 174 mg/dL (ref 0–200)
HDL: 44 mg/dL (ref >40)
LDL Cholesterol: 100 mg/dL — ABNORMAL HIGH (ref 0–99)
Total CHOL/HDL Ratio: 4 RATIO
Triglycerides: 151 mg/dL — ABNORMAL HIGH (ref <150)
VLDL: 30 mg/dL (ref 0–40)

## 2019-04-27 LAB — COMPREHENSIVE METABOLIC PANEL
ALT: 24 U/L (ref 0–44)
AST: 19 U/L (ref 15–41)
Albumin: 4.5 g/dL (ref 3.5–5.0)
Alkaline Phosphatase: 64 U/L (ref 38–126)
Anion gap: 10 (ref 5–15)
BUN: 9 mg/dL (ref 8–23)
CO2: 25 mmol/L (ref 22–32)
Calcium: 9.3 mg/dL (ref 8.9–10.3)
Chloride: 101 mmol/L (ref 98–111)
Creatinine, Ser: 0.84 mg/dL (ref 0.61–1.24)
GFR calc Af Amer: >60 mL/min (ref >60)
GFR calc non Af Amer: >60 mL/min (ref >60)
Glucose, Bld: 128 mg/dL — ABNORMAL HIGH (ref 70–99)
Potassium: 3.9 mmol/L (ref 3.5–5.1)
Sodium: 136 mmol/L (ref 135–145)
Total Bilirubin: 0.9 mg/dL (ref 0.3–1.2)
Total Protein: 7.6 g/dL (ref 6.5–8.1)

## 2019-04-27 NOTE — Telephone Encounter (Signed)
Patient had lab work drawn today.

## 2019-04-28 ENCOUNTER — Telehealth: Payer: Self-pay | Admitting: Internal Medicine

## 2019-04-28 ENCOUNTER — Other Ambulatory Visit: Payer: Self-pay | Admitting: *Deleted

## 2019-04-28 MED ORDER — EZETIMIBE 10 MG PO TABS: 10.0000 mg | ORAL_TABLET | Freq: Every day | ORAL | 2 refills | Status: DC

## 2019-04-28 NOTE — Telephone Encounter (Signed)
Spoke with pharmacist and she said 90-day supply for generic would be $30. She said patient could get it by the month if insurance approves.  Called patient. He said he had called his insurance company as well and it was in Tier 2.  He said he looked at his numbers and was pleased with them as they were lower than usual or had been in the past. I stressed that though they are lower than before, they are still not at goal to adequately prevent a cardiac event according to the guidelines. Patient was not convinced and said he will choose not to take the zetia.  Stated also his blood glucose was much lower than in the past for fasting as well. He did not see the point in taking another medication at this time and will follow up with [place] at the year month as advised.  He also asked about PCP in the area. Advised  at [facility] as this is close to where he lives. He asked me to MyChart him the information.  Advised I will make Dr End aware and let him know if he has any further recommendations.

## 2019-04-28 NOTE — Telephone Encounter (Signed)
Patient calling in regarding new prescription sent patient was notified of over mychart. Patient called pharmacy and the medication is more expensive than he is used to paying and would like to know if there is a generic or anything that may be closer to his $10 copay  Please advise, voicemail is okay

## 2019-07-02 DIAGNOSIS — H4921 Sixth [abducent] nerve palsy, right eye: Secondary | ICD-10-CM | POA: Diagnosis not present

## 2019-07-02 LAB — HM DIABETES EYE EXAM

## 2019-07-30 ENCOUNTER — Encounter: Payer: Self-pay | Admitting: Family Medicine

## 2019-07-30 ENCOUNTER — Telehealth: Payer: Self-pay

## 2019-07-30 ENCOUNTER — Other Ambulatory Visit: Payer: Self-pay

## 2019-07-30 ENCOUNTER — Ambulatory Visit (INDEPENDENT_AMBULATORY_CARE_PROVIDER_SITE_OTHER): Payer: PPO | Admitting: Family Medicine

## 2019-07-30 VITALS — BP 132/58 | HR 85 | Temp 97.9°F | Resp 22 | Ht 67.75 in | Wt 212.8 lb

## 2019-07-30 DIAGNOSIS — M1710 Unilateral primary osteoarthritis, unspecified knee: Secondary | ICD-10-CM | POA: Insufficient documentation

## 2019-07-30 DIAGNOSIS — E119 Type 2 diabetes mellitus without complications: Secondary | ICD-10-CM | POA: Diagnosis not present

## 2019-07-30 DIAGNOSIS — Z808 Family history of malignant neoplasm of other organs or systems: Secondary | ICD-10-CM | POA: Diagnosis not present

## 2019-07-30 DIAGNOSIS — I10 Essential (primary) hypertension: Secondary | ICD-10-CM

## 2019-07-30 DIAGNOSIS — E1149 Type 2 diabetes mellitus with other diabetic neurological complication: Secondary | ICD-10-CM

## 2019-07-30 DIAGNOSIS — Z85820 Personal history of malignant melanoma of skin: Secondary | ICD-10-CM

## 2019-07-30 DIAGNOSIS — I2510 Atherosclerotic heart disease of native coronary artery without angina pectoris: Secondary | ICD-10-CM

## 2019-07-30 LAB — POCT GLYCOSYLATED HEMOGLOBIN (HGB A1C): Hemoglobin A1C: 7.6 % — AB (ref 4.0–5.6)

## 2019-07-30 MED ORDER — ATORVASTATIN CALCIUM 80 MG PO TABS: 80.0000 mg | ORAL_TABLET | Freq: Every day | ORAL | 2 refills | Status: DC

## 2019-07-30 MED ORDER — METFORMIN HCL 500 MG PO TABS: 500.0000 mg | ORAL_TABLET | Freq: Two times a day (BID) | ORAL | 3 refills | Status: DC

## 2019-07-30 MED ORDER — FLUOROURACIL 5 % EX CREA: TOPICAL_CREAM | Freq: Two times a day (BID) | CUTANEOUS | 0 refills | Status: DC

## 2019-07-30 NOTE — Telephone Encounter (Signed)
Added this medication to patient's list

## 2019-07-30 NOTE — Assessment & Plan Note (Signed)
Follows with dermatology. Dad with melanoma

## 2019-07-30 NOTE — Progress Notes (Signed)
Subjective:     Christopher Oconnell is a 69 y.o. male presenting for Establish Care (previous PCP Dr Julieanne Manson in epic)     HPI   #Diabetes Currently taking metformin (Glucophage, Riomet)  Using medications without difficulties: Yes Hypoglycemic episodes:No  Hyperglycemic episodes:No  Feet problems:notes tingling and cold sensation, worse at nighttime Blood Sugars averaging: 130-140 Last HgbA1c:  Lab Results  Component Value Date   HGBA1C 7.6 (A) 07/30/2019    Diabetes Health Maintenance Due:    Diabetes Health Maintenance Due  Topic Date Due  . OPHTHALMOLOGY EXAM  Never done  . HEMOGLOBIN A1C  01/30/2020  . FOOT EXAM  07/29/2020    #Melanoma - hx of treatment with topical therapy - was seeing someone in [place] - would like to transition to [place] - lesions keep coming back - last appt 1 year ago    Review of Systems   Social History   Tobacco Use  Smoking Status Former Smoker  . Packs/day: 3.50  . Years: 30.00  . Pack years: 105.00  . Types: Cigarettes  . Quit date: 52  . Years since quitting: 28.4  Smokeless Tobacco Never Used        Objective:    BP Readings from Last 3 Encounters:  07/30/19 (!) 132/58  02/11/19 136/72   Wt Readings from Last 3 Encounters:  07/30/19 212 lb 12 oz (96.5 kg)  02/11/19 217 lb 8 oz (98.7 kg)    BP (!) 132/58   Pulse 85   Temp 97.9 F (36.6 C)   Resp (!) 22   Ht 5' 7.75" (1.721 m)   Wt 212 lb 12 oz (96.5 kg)   SpO2 96%   BMI 32.59 kg/m    Physical Exam Constitutional:      Appearance: Normal appearance. He is not ill-appearing or diaphoretic.  HENT:     Right Ear: External ear normal.     Left Ear: External ear normal.  Eyes:     General: No scleral icterus.    Extraocular Movements: Extraocular movements intact.     Conjunctiva/sclera: Conjunctivae normal.  Cardiovascular:     Rate and Rhythm: Normal rate and regular rhythm.     Heart sounds: Murmur present.  Pulmonary:      Effort: Pulmonary effort is normal. No respiratory distress.     Breath sounds: Normal breath sounds. No wheezing.  Musculoskeletal:     Cervical back: Neck supple.  Skin:    General: Skin is warm and dry.     Comments: Right ear with yellow crusted lesion.   Neurological:     Mental Status: He is alert. Mental status is at baseline.  Psychiatric:        Mood and Affect: Mood normal.    Diabetic Foot Exam - Simple   Simple Foot Form Diabetic Foot exam was performed with the following findings: Yes 07/30/2019  9:11 AM  Visual Inspection No deformities, no ulcerations, no other skin breakdown bilaterally: Yes Sensation Testing See comments: Yes Pulse Check Posterior Tibialis and Dorsalis pulse intact bilaterally: Yes Comments Intact to light touch. Monofilament testing was absent in right foot on #5 and left foot #1 and #5           Assessment & Plan:   Problem List Items Addressed This Visit      Cardiovascular and Mediastinum   Coronary artery disease involving native coronary artery of native heart without angina pectoris   Relevant Medications   atorvastatin (LIPITOR) 80  MG tablet   Essential hypertension    Controled. Cont enalapril, amlodipine, and maxzide. Occasional forgets the evening enalapril      Relevant Medications   atorvastatin (LIPITOR) 80 MG tablet     Endocrine   Type 2 diabetes mellitus with neurological complications (HCC) - Primary    Hgb A1c 7.6. Discussed starting metformin BID, he will do so. Return in 3 months. He is treating neuropathy with topical ointment. If needed could consider gabapentin but his symptoms are controlled currently.       Relevant Medications   atorvastatin (LIPITOR) 80 MG tablet   metFORMIN (GLUCOPHAGE) 500 MG tablet     Musculoskeletal and Integument   Arthritis of knee    Does well with Tumeric for treatment. Cont to monitor.         Other   Family history of melanoma    Follows with dermatology. Dad with  melanoma      Relevant Orders   Ambulatory referral to Dermatology   History of melanoma    Pt reports history of melanoma and treatment with topical ointment - does not recall the name. Lesion on ear he has been treating does not seem consistent with melanoma. Requesting dermatology closer to home, he will call back with the ointment he has been using.       Relevant Orders   Ambulatory referral to Dermatology       Return in about 3 months (around 10/30/2019) for Diabetes.  Lesleigh Noe, MD

## 2019-07-30 NOTE — Assessment & Plan Note (Signed)
Pt reports history of melanoma and treatment with topical ointment - does not recall the name. Lesion on ear he has been treating does not seem consistent with melanoma. Requesting dermatology closer to home, he will call back with the ointment he has been using.

## 2019-07-30 NOTE — Patient Instructions (Addendum)
#  Diabetes - Start taking Metformin twice daily with meals  - Refill sent to pharmacy - Continue to monitor sugars - Return in 3 months

## 2019-07-30 NOTE — Assessment & Plan Note (Signed)
Controled. Cont enalapril, amlodipine, and maxzide. Occasional forgets the evening enalapril

## 2019-07-30 NOTE — Assessment & Plan Note (Signed)
Hgb A1c 7.6. Discussed starting metformin BID, he will do so. Return in 3 months. He is treating neuropathy with topical ointment. If needed could consider gabapentin but his symptoms are controlled currently.

## 2019-07-30 NOTE — Assessment & Plan Note (Signed)
Does well with Tumeric for treatment. Cont to monitor.

## 2019-07-30 NOTE — Telephone Encounter (Signed)
Pt left v/m that established with Dr Einar Pheasant earlier today and pt was to cb with name of cream using from dermatologist. The name of the cream is Fluorouracil cream 5 %.

## 2019-07-30 NOTE — Telephone Encounter (Signed)
07-30-19 Dermatology referral closed.  [facility] called pt.  Pt refused to schedule appt.  [place]

## 2019-07-31 NOTE — Telephone Encounter (Signed)
Noted. Would recommend he continue seeing dermatology for this.

## 2019-10-28 ENCOUNTER — Other Ambulatory Visit: Payer: Self-pay

## 2019-10-28 ENCOUNTER — Ambulatory Visit (INDEPENDENT_AMBULATORY_CARE_PROVIDER_SITE_OTHER): Payer: PPO | Admitting: Family Medicine

## 2019-10-28 VITALS — BP 110/58 | HR 93 | Temp 97.8°F | Ht 68.0 in | Wt 210.0 lb

## 2019-10-28 DIAGNOSIS — I10 Essential (primary) hypertension: Secondary | ICD-10-CM

## 2019-10-28 DIAGNOSIS — E1149 Type 2 diabetes mellitus with other diabetic neurological complication: Secondary | ICD-10-CM | POA: Diagnosis not present

## 2019-10-28 DIAGNOSIS — E785 Hyperlipidemia, unspecified: Secondary | ICD-10-CM | POA: Diagnosis not present

## 2019-10-28 NOTE — Assessment & Plan Note (Signed)
Last lipids not at goal on atorvastatin 80 mg. Will plan for continued diabetic diet and repeat in February. If still not at goal may consider additional medication.

## 2019-10-28 NOTE — Progress Notes (Signed)
Subjective:     Christopher Oconnell is a 69 y.o. male presenting for Follow-up (DM )     HPI  #Diabetes Currently taking metformin (Glucophage, Riomet)  Using medications without difficulties: Yes Hypoglycemic episodes:No  Hyperglycemic episodes:No  Feet problems:neuropathy Blood Sugars averaging: 110s-140s Last HgbA1c:  Lab Results  Component Value Date   HGBA1C 7.6 (A) 07/30/2019    Diabetes Health Maintenance Due:    Diabetes Health Maintenance Due  Topic Date Due  . HEMOGLOBIN A1C  01/30/2020  . OPHTHALMOLOGY EXAM  07/01/2020  . FOOT EXAM  07/29/2020   Eye irritation - feels gritty all the time - using artificial tears every morning -   #HTN - no lightheadedness, dizziness - no cp, sob  #Ear lesion - has used neosporin  - has been draining -   Review of Systems   Social History   Tobacco Use  Smoking Status Former Smoker  . Packs/day: 3.50  . Years: 30.00  . Pack years: 105.00  . Types: Cigarettes  . Quit date: 9  . Years since quitting: 28.6  Smokeless Tobacco Never Used        Objective:    BP Readings from Last 3 Encounters:  10/28/19 (!) 110/58  07/30/19 (!) 132/58  02/11/19 136/72   Wt Readings from Last 3 Encounters:  10/28/19 210 lb (95.3 kg)  07/30/19 212 lb 12 oz (96.5 kg)  02/11/19 217 lb 8 oz (98.7 kg)    BP (!) 110/58   Pulse 93   Temp 97.8 F (36.6 C) (Temporal)   Ht 5\' 8"  (1.727 m)   Wt 210 lb (95.3 kg)   SpO2 95%   BMI 31.93 kg/m    Physical Exam Constitutional:      Appearance: Normal appearance. He is not ill-appearing or diaphoretic.  HENT:     Right Ear: External ear normal.     Left Ear: External ear normal.     Ears:     Comments: Scabbed lesion on the outer right ear Eyes:     General: No scleral icterus.    Extraocular Movements: Extraocular movements intact.     Conjunctiva/sclera: Conjunctivae normal.  Cardiovascular:     Rate and Rhythm: Normal rate.  Pulmonary:     Effort:  Pulmonary effort is normal.  Musculoskeletal:     Cervical back: Neck supple.  Skin:    General: Skin is warm and dry.  Neurological:     Mental Status: He is alert. Mental status is at baseline.  Psychiatric:        Mood and Affect: Mood normal.     Lab Results  Component Value Date   CHOL 174 04/27/2019   HDL 44 04/27/2019   LDLCALC 100 (H) 04/27/2019   TRIG 151 (H) 04/27/2019   CHOLHDL 4.0 04/27/2019         Assessment & Plan:   Problem List Items Addressed This Visit      Cardiovascular and Mediastinum   Essential hypertension    BP low today and previously. Pt will reduce enalapril from twice daily to once daily. MyChart in 2 weeks with BP readings. Cont amlodipine and maxzide.         Endocrine   Type 2 diabetes mellitus with neurological complications ([place]) - Primary    Home monitoring with improved control, though patient admits to following diet better on days he checks sugars. Will repeat HgbA1c next week as has not been 3 months.  Other   Hyperlipidemia LDL goal <70    Last lipids not at goal on atorvastatin 80 mg. Will plan for continued diabetic diet and repeat in February. If still not at goal may consider additional medication.         Ear lesion - has derm appointment next week. Advised getting their opinion  Return in about 6 months (around 04/29/2020) for annual wellness.  Lesleigh Noe, MD  This visit occurred during the SARS-CoV-2 public health emergency.  Safety protocols were in place, including screening questions prior to the visit, additional usage of staff PPE, and extensive cleaning of exam room while observing appropriate contact time as indicated for disinfecting solutions.

## 2019-10-28 NOTE — Patient Instructions (Addendum)
Diabetes - return next week for lab work - finger stick  Hypertension - stop taking the evening dose of enalapril  - continue all the other medications - send a message with blood pressure over the next 2 weeks  Consider doing tumeric twice daily

## 2019-10-28 NOTE — Assessment & Plan Note (Signed)
BP low today and previously. Pt will reduce enalapril from twice daily to once daily. MyChart in 2 weeks with BP readings. Cont amlodipine and maxzide.

## 2019-10-28 NOTE — Assessment & Plan Note (Signed)
Home monitoring with improved control, though patient admits to following diet better on days he checks sugars. Will repeat HgbA1c next week as has not been 3 months.

## 2019-11-04 ENCOUNTER — Other Ambulatory Visit (INDEPENDENT_AMBULATORY_CARE_PROVIDER_SITE_OTHER): Payer: PPO

## 2019-11-04 ENCOUNTER — Other Ambulatory Visit: Payer: Self-pay

## 2019-11-04 DIAGNOSIS — E1149 Type 2 diabetes mellitus with other diabetic neurological complication: Secondary | ICD-10-CM | POA: Diagnosis not present

## 2019-11-04 LAB — HEMOGLOBIN A1C: Hgb A1c MFr Bld: 6.7 % — ABNORMAL HIGH (ref 4.6–6.5)

## 2019-11-05 ENCOUNTER — Ambulatory Visit: Payer: PPO | Admitting: Dermatology

## 2019-11-05 ENCOUNTER — Other Ambulatory Visit: Payer: Self-pay

## 2019-11-05 DIAGNOSIS — C44212 Basal cell carcinoma of skin of right ear and external auricular canal: Secondary | ICD-10-CM | POA: Diagnosis not present

## 2019-11-05 DIAGNOSIS — L578 Other skin changes due to chronic exposure to nonionizing radiation: Secondary | ICD-10-CM | POA: Diagnosis not present

## 2019-11-05 DIAGNOSIS — L821 Other seborrheic keratosis: Secondary | ICD-10-CM

## 2019-11-05 DIAGNOSIS — Z85828 Personal history of other malignant neoplasm of skin: Secondary | ICD-10-CM | POA: Diagnosis not present

## 2019-11-05 DIAGNOSIS — D1800 Hemangioma unspecified site: Secondary | ICD-10-CM | POA: Diagnosis not present

## 2019-11-05 DIAGNOSIS — L814 Other melanin hyperpigmentation: Secondary | ICD-10-CM

## 2019-11-05 DIAGNOSIS — L820 Inflamed seborrheic keratosis: Secondary | ICD-10-CM | POA: Diagnosis not present

## 2019-11-05 DIAGNOSIS — D485 Neoplasm of uncertain behavior of skin: Secondary | ICD-10-CM

## 2019-11-05 DIAGNOSIS — L918 Other hypertrophic disorders of the skin: Secondary | ICD-10-CM | POA: Diagnosis not present

## 2019-11-05 DIAGNOSIS — D692 Other nonthrombocytopenic purpura: Secondary | ICD-10-CM

## 2019-11-05 DIAGNOSIS — Z1283 Encounter for screening for malignant neoplasm of skin: Secondary | ICD-10-CM | POA: Diagnosis not present

## 2019-11-05 DIAGNOSIS — L570 Actinic keratosis: Secondary | ICD-10-CM | POA: Diagnosis not present

## 2019-11-05 DIAGNOSIS — C4491 Basal cell carcinoma of skin, unspecified: Secondary | ICD-10-CM

## 2019-11-05 HISTORY — DX: Basal cell carcinoma of skin, unspecified: C44.91

## 2019-11-05 NOTE — Progress Notes (Signed)
New Patient Visit  Subjective  Christopher Oconnell is a 69 y.o. male who presents for the following: TBSE (Patient has a history of skin cancer treated on his left forearm and back about 2 years ago at a dermatologist in [place]. ) and Spot (right ear x 8 months. Tried 5FU Cream in the past which helped a little, but didn't clear.). The patient presents for Total-Body Skin Exam (TBSE) for skin cancer screening and mole check.  The following portions of the chart were reviewed this encounter and updated as appropriate:  Tobacco  Allergies  Meds  Problems  Med Hx  Surg Hx  Fam Hx     Review of Systems:  No other skin or systemic complaints except as noted in HPI or Assessment and Plan.  Objective  Well appearing patient in no apparent distress; mood and affect are within normal limits.  A full examination was performed including scalp, head, eyes, ears, nose, lips, neck, chest, axillae, abdomen, back, buttocks, bilateral upper extremities, bilateral lower extremities, hands, feet, fingers, toes, fingernails, and toenails. All findings within normal limits unless otherwise noted below.  Objective  Scalp, Face, Ears (12): Erythematous thin papules/macules with gritty scale.   Objective  Right Ear Above Crus: 1 x 0.7cm crusted papule     Objective  Left Forearm x 1, Left thigh x 2, R dorsum hand x 1 (4): Erythematous keratotic or waxy stuck-on papule or plaque.    Assessment & Plan   Skin cancer screening performed today.  Actinic Damage - diffuse scaly erythematous macules with underlying dyspigmentation - Recommend daily broad spectrum sunscreen SPF 30+ to sun-exposed areas, reapply every 2 hours as needed.  - Call for new or changing lesions.  Seborrheic Keratoses - Stuck-on, waxy, tan-brown papules and plaques  - Discussed benign etiology and prognosis. - Observe - Call for any changes   Lentigines - Scattered tan macules - Discussed due to sun exposure -  Benign, observe - Call for any changes  Hemangiomas - Red papules - Discussed benign nature - Observe - Call for any changes  Acrochordons (Skin Tags) - Fleshy, skin-colored pedunculated papules - Benign appearing.  - Observe. - If desired, they can be removed with an in office procedure that is not covered by insurance. - Please call the clinic if you notice any new or changing lesions.  Purpura - Violaceous macules and patches - Benign - Related to age, sun damage and/or use of blood thinners - Observe - Can use OTC arnica containing moisturizer such as Dermend Bruise Formula if desired - Call for worsening or other concerns  AK (actinic keratosis) (12) Scalp, Face, Ears  Destruction of lesion - Scalp, Face, Ears Complexity: simple   Destruction method: cryotherapy   Informed consent: discussed and consent obtained   Timeout:  patient name, date of birth, surgical site, and procedure verified Lesion destroyed using liquid nitrogen: Yes   Region frozen until ice ball extended beyond lesion: Yes   Outcome: patient tolerated procedure well with no complications   Post-procedure details: wound care instructions given    Neoplasm of uncertain behavior of skin Right Ear Above Crus  Skin / nail biopsy Type of biopsy: tangential   Informed consent: discussed and consent obtained   Timeout: patient name, date of birth, surgical site, and procedure verified   Procedure prep:  Patient was prepped and draped in usual sterile fashion Prep type:  Isopropyl alcohol Anesthesia: the lesion was anesthetized in a standard fashion  Anesthetic:  1% lidocaine w/ epinephrine 1-100,000 buffered w/ 8.4% NaHCO3 Instrument used: flexible razor blade   Hemostasis achieved with: pressure, aluminum chloride and electrodesiccation   Outcome: patient tolerated procedure well   Post-procedure details: sterile dressing applied and wound care instructions given   Dressing type: bandage and  petrolatum    Specimen 1 - Surgical pathology Differential Diagnosis: R/O BCC Check Margins: No 1 x 0.7cm crusted papule  Inflamed seborrheic keratosis (4) Left Forearm x 1, Left thigh x 2, R dorsum hand x 1  Destruction of lesion - Left Forearm x 1, Left thigh x 2, R dorsum hand x 1 Complexity: simple   Destruction method: cryotherapy   Informed consent: discussed and consent obtained   Timeout:  patient name, date of birth, surgical site, and procedure verified Lesion destroyed using liquid nitrogen: Yes   Region frozen until ice ball extended beyond lesion: Yes   Outcome: patient tolerated procedure well with no complications   Post-procedure details: wound care instructions given    Return in about 6 months (around 05/07/2020) for AKs.  Lindi Adie, CMA, am acting as scribe for Sarina Ser, MD .  Documentation: I have reviewed the above documentation for accuracy and completeness, and I agree with the above.  Sarina Ser, MD

## 2019-11-05 NOTE — Patient Instructions (Signed)

## 2019-11-11 ENCOUNTER — Telehealth: Payer: Self-pay

## 2019-11-11 NOTE — Telephone Encounter (Signed)
-----   Message from Ralene Bathe, MD sent at 11/09/2019 12:07 PM EDT ----- Skin , (A) right ear above crus BASAL CELL CARCINOMA, NODULAR AND INFILTRATIVE PATTERNS  Cancer - BCC Schedule surgery

## 2019-11-11 NOTE — Telephone Encounter (Signed)
Patient informed of pathology results and surgery appointment scheduled.

## 2019-11-13 ENCOUNTER — Encounter: Payer: Self-pay | Admitting: Dermatology

## 2019-11-24 ENCOUNTER — Other Ambulatory Visit: Payer: Self-pay | Admitting: Internal Medicine

## 2020-01-19 ENCOUNTER — Other Ambulatory Visit: Payer: Self-pay

## 2020-01-19 ENCOUNTER — Encounter: Payer: Self-pay | Admitting: Dermatology

## 2020-01-19 ENCOUNTER — Ambulatory Visit: Payer: PPO | Admitting: Dermatology

## 2020-01-19 ENCOUNTER — Telehealth: Payer: Self-pay

## 2020-01-19 DIAGNOSIS — C44212 Basal cell carcinoma of skin of right ear and external auricular canal: Secondary | ICD-10-CM

## 2020-01-19 MED ORDER — MUPIROCIN 2 % EX OINT: 1.0000 "application " | TOPICAL_OINTMENT | Freq: Every day | CUTANEOUS | 1 refills | Status: DC

## 2020-01-19 NOTE — Telephone Encounter (Signed)
Left message for patient regarding surgery/hd  

## 2020-01-19 NOTE — Progress Notes (Signed)
   Follow-Up Visit   Subjective  Christopher Oconnell is a 69 y.o. male who presents for the following: Basal Cell Carcinoma (Biopsy proven of right ear above crus - Excised today).  The following portions of the chart were reviewed this encounter and updated as appropriate:  Tobacco  Allergies  Meds  Problems  Med Hx  Surg Hx  Fam Hx     Review of Systems:  No other skin or systemic complaints except as noted in HPI or Assessment and Plan.  Objective  Well appearing patient in no apparent distress; mood and affect are within normal limits.  A focused examination was performed including R ear. Relevant physical exam findings are noted in the Assessment and Plan.  Objective  Right ear above the crus: Healing biopsy site 1.6cm   Assessment & Plan  Basal cell carcinoma (BCC) of skin of right ear Right ear above the crus  Skin excision  Lesion length (cm):  1.6 Lesion width (cm):  1.6 Margin per side (cm):  0.2 Total excision diameter (cm):  2 Informed consent: discussed and consent obtained   Timeout: patient name, date of birth, surgical site, and procedure verified   Procedure prep:  Patient was prepped and draped in usual sterile fashion Prep type:  Isopropyl alcohol and povidone-iodine Anesthesia: the lesion was anesthetized in a standard fashion   Anesthetic:  1% lidocaine w/ epinephrine 1-100,000 buffered w/ 8.4% NaHCO3 (3cc) Instrument used comment:  15c blade Hemostasis achieved with: pressure   Hemostasis achieved with comment:  Electrocautery Outcome: patient tolerated procedure well with no complications   Post-procedure details: sterile dressing applied and wound care instructions given   Dressing type: bandage and pressure dressing (mupirocin)   Additional details:  Suture at 9:00 posterior edge  mupirocin ointment (BACTROBAN) 2 %  Specimen 1 - Surgical pathology Differential Diagnosis: Biopsy proven BCC Check Margins: yes Healing biopsy site  1.6cm [id number]  Return in about 1 week (around 01/26/2020).   Documentation: I have reviewed the above documentation for accuracy and completeness, and I agree with the above.  Sarina Ser, MD

## 2020-01-19 NOTE — Patient Instructions (Signed)

## 2020-01-26 ENCOUNTER — Other Ambulatory Visit: Payer: Self-pay

## 2020-01-26 ENCOUNTER — Ambulatory Visit: Payer: PPO | Admitting: Dermatology

## 2020-01-26 ENCOUNTER — Encounter: Payer: Self-pay | Admitting: Dermatology

## 2020-01-26 DIAGNOSIS — Z85828 Personal history of other malignant neoplasm of skin: Secondary | ICD-10-CM

## 2020-01-26 MED ORDER — TOBRAMYCIN 0.3 % OP SOLN: OPHTHALMIC | 1 refills | Status: DC

## 2020-01-26 NOTE — Progress Notes (Signed)
   Follow-Up Visit   Subjective  Christopher Oconnell is a 69 y.o. male who presents for the following: post op. (pathology proven margins free BCC - R ear above the crus, patient is here today to check on the healing process).  The following portions of the chart were reviewed this encounter and updated as appropriate:  Tobacco  Allergies  Meds  Problems  Med Hx  Surg Hx  Fam Hx     Review of Systems:  No other skin or systemic complaints except as noted in HPI or Assessment and Plan.  Objective  Well appearing patient in no apparent distress; mood and affect are within normal limits.  A focused examination was performed including the right ear . Relevant physical exam findings are noted in the Assessment and Plan.  Objective  R ear above the crus: Healing ulceration   Assessment & Plan  History of basal cell carcinoma (BCC) R ear above the crus Margins free (pathology proven)-   Continue wound care daily and Mupirocin 2% ointment QD. Start Tobramycin drops to aa's QD until healed.   tobramycin (TOBREX) 0.3 % ophthalmic solution - R ear above the crus  Return for appointment as scheduled.  [name], CMA, am acting as scribe for Sarina Ser, MD .  Documentation: I have reviewed the above documentation for accuracy and completeness, and I agree with the above.  Sarina Ser, MD

## 2020-01-29 ENCOUNTER — Encounter: Payer: Self-pay | Admitting: Dermatology

## 2020-02-15 ENCOUNTER — Other Ambulatory Visit: Payer: Self-pay

## 2020-02-15 ENCOUNTER — Ambulatory Visit: Payer: PPO | Admitting: Internal Medicine

## 2020-02-15 ENCOUNTER — Encounter: Payer: Self-pay | Admitting: Internal Medicine

## 2020-02-15 VITALS — BP 132/68 | HR 76 | Ht 68.0 in | Wt 211.0 lb

## 2020-02-15 DIAGNOSIS — R5382 Chronic fatigue, unspecified: Secondary | ICD-10-CM

## 2020-02-15 DIAGNOSIS — I2510 Atherosclerotic heart disease of native coronary artery without angina pectoris: Secondary | ICD-10-CM

## 2020-02-15 DIAGNOSIS — E785 Hyperlipidemia, unspecified: Secondary | ICD-10-CM

## 2020-02-15 DIAGNOSIS — I10 Essential (primary) hypertension: Secondary | ICD-10-CM

## 2020-02-15 NOTE — Patient Instructions (Signed)
Medication Instructions:  Your physician recommends that you continue on your current medications as directed. Please refer to the Current Medication list given to you today.  You may hold your atorvastatin to see if you're symptoms (fatigue) improves. Please let us know the outcome after a few weeks.  *If you need a refill on your cardiac medications before your next appointment, please call your pharmacy*  Follow-Up: At [facility], you and your health needs are our priority.  As part of our continuing mission to provide you with exceptional heart care, we have created designated Provider Care Teams.  These Care Teams include your primary Cardiologist (physician) and Advanced Practice Providers (APPs -  Physician Assistants and Nurse Practitioners) who all work together to provide you with the care you need, when you need it.  We recommend signing up for the patient portal called "MyChart".  Sign up information is provided on this After Visit Summary.  MyChart is used to connect with patients for Virtual Visits (Telemedicine).  Patients are able to view lab/test results, encounter notes, upcoming appointments, etc.  Non-urgent messages can be sent to your provider as well.   To learn more about what you can do with MyChart, go to [email or web address].    Your next appointment:   6 month(s)  The format for your next appointment:   In Person  Provider:   You may see DR Harrell Gave END or one of the following Advanced Practice Providers on your designated Care Team:    Murray Hodgkins, NP  Christell Faith, PA-C  Marrianne Mood, PA-C  Cadence [place], [place]  Laurann Montana, NP    Other Instructions

## 2020-02-15 NOTE — Progress Notes (Signed)
Follow-up Outpatient Visit Date: 02/15/2020  Primary Care Provider: Lesleigh Noe, [place] [address]  Chief Complaint: Fatigue  HPI:  [name] is a 69 y.o. male with history of coronary artery disease status post angioplasty to D1 in 1993, hypertension, hyperlipidemia, and type 2 diabetes mellitus, who presents for follow-up of coronary artery disease.  I last saw him in 02/2019, at which time he was doing well other than mild exertional dyspnea that had been stable for years.  Lipid panel in 04/2019 was notable for LDL of 100 and triglycerides of 151.  Addition of ezetimibe to his standing atorvastatin 80 mg daily was recommended.   However, [name] declined, as he felt like his cholesterol numbers looked pretty good.  Today, [name] reports that he feels about the same as last year's visit.  He is most concerned about fatigue with less energy and achiness.  He wonders if this could be due to his cholesterol medication.  Symptoms have been present for years.  He notes some exertional shortness of breath with extreme activity but no dyspnea with his daily routine.  This has been stable for at least a year.  He denies chest pain, palpitations, lightheadedness, and dizziness.  Home blood pressure readings are similar to today's.  On further questioning, [name] knows that he does not sleep well at night.  He often wakes up several times.  He has been taking ibuprofen 200 mg at bedtime recently to help him sleep and has noticed a little bit of improvement.  --------------------------------------------------------------------------------------------------  Past Medical History:  Diagnosis Date  . Arthritis   . Asthma   . Basal cell carcinoma 11/05/2019   Right ear above crus. Nodular and infiltrative patterns. Excised 01/19/2020, margins free.  . Coronary artery disease   . Diabetes mellitus without complication ([place])   . GERD  (gastroesophageal reflux disease)   . Hyperlipidemia   . Hypertension    Past Surgical History:  Procedure Laterality Date  . CORONARY ANGIOPLASTY     No stents  . NOSE SURGERY    . TONSILLECTOMY      Current Meds  Medication Sig  . amLODipine (NORVASC) 10 MG tablet TAKE 1 TABLET BY MOUTH EVERY DAY  . aspirin EC 81 MG tablet Take 81 mg by mouth daily.  Marland Kitchen atorvastatin (LIPITOR) 80 MG tablet Take 1 tablet (80 mg total) by mouth daily.  Marland Kitchen CINNAMON PO Take 200 mg by mouth 2 (two) times daily.  . enalapril (VASOTEC) 20 MG tablet Take 20 mg by mouth daily.  Marland Kitchen FREESTYLE LITE test strip 1 each daily.  . metFORMIN (GLUCOPHAGE) 500 MG tablet Take 1 tablet (500 mg total) by mouth 2 (two) times daily with a meal.  . Multiple Vitamin (MULTIVITAMIN) tablet Take 1 tablet by mouth daily.  . mupirocin ointment (BACTROBAN) 2 % Apply 1 application topically daily. With dressing changes  . Omega-3 Fatty Acids (FISH OIL PO) Take 1,200 mg by mouth. Takes 1 tablet qd.  . Potassium Gluconate 550 MG TABS Take by mouth daily as needed.  . tobramycin (TOBREX) 0.3 % ophthalmic solution Apply one drop to affected area of right ear once daily after wound cleansing.  . triamterene-hydrochlorothiazide (MAXZIDE-25) 37.5-25 MG tablet Take 1 tablet by mouth every morning.  . Turmeric (QC TUMERIC COMPLEX PO) Take by mouth daily.  . [DISCONTINUED] fluorouracil (EFUDEX) 5 % cream Apply topically 2 (two) times daily.    Allergies: Patient has no  known allergies.  Social History   Tobacco Use  . Smoking status: Former Smoker    Packs/day: 3.50    Years: 30.00    Pack years: 105.00    Types: Cigarettes    Quit date: 1993    Years since quitting: 28.9  . Smokeless tobacco: Never Used  Vaping Use  . Vaping Use: Never used  Substance Use Topics  . Alcohol use: Yes    Alcohol/week: 12.0 standard drinks    Types: 10 Cans of beer, 2 Shots of liquor per week    Comment: 15-20 beers on the weekend  . Drug use:  Never    Family History  Problem Relation Age of Onset  . Dementia Mother   . Coronary artery disease Father   . Melanoma Father   . Pancreatic cancer Father   . Breast cancer Sister   . Drug abuse Sister   . Cancer Maternal Grandmother        unsure of the type  . Heart attack Paternal Grandmother   . Heart attack Paternal Grandfather     Review of Systems: A 12-system review of systems was performed and was negative except as noted in the HPI.  --------------------------------------------------------------------------------------------------  Physical Exam: BP 132/68 (BP Location: Left Arm, Patient Position: Sitting, Cuff Size: Normal)   Pulse 76   Ht 5\' 8"  (1.727 m)   Wt 211 lb (95.7 kg)   SpO2 97%   BMI 32.08 kg/m   General: NAD. Neck: No JVD or HJR. Lungs: Clear to auscultation bilateral without wheezes or crackles. Heart: Regular rate and rhythm without murmurs, rubs, or gallops. Abdomen: Soft, nontender, nondistended. Extremities: No lower extremity edema.  EKG: Normal sinus rhythm with PACs.  Otherwise, no significant abnormality  Lab Results  Component Value Date   WBC 14.5 (H) 12/07/2009   HGB 16.5 12/07/2009   HCT 47.9 12/07/2009   MCV 93.2 12/07/2009   PLT 206 12/07/2009    Lab Results  Component Value Date   NA 136 04/27/2019   K 3.9 04/27/2019   CL 101 04/27/2019   CO2 25 04/27/2019   BUN 9 04/27/2019   CREATININE 0.84 04/27/2019   GLUCOSE 128 (H) 04/27/2019   ALT 24 04/27/2019    Lab Results  Component Value Date   CHOL 174 04/27/2019   HDL 44 04/27/2019   LDLCALC 100 (H) 04/27/2019   TRIG 151 (H) 04/27/2019   CHOLHDL 4.0 04/27/2019    --------------------------------------------------------------------------------------------------  ASSESSMENT AND PLAN: Coronary artery disease: [name] denies angina.  He has stable dyspnea with extreme exertion.  His biggest complaint is of chronic fatigue, which is nonspecific.  His EKG  today does not show any ischemic changes.  We have discussed further evaluation including echocardiogram and ischemia testing but have agreed to defer this.  Fatigue: This has been present for years and is nonspecific.  I have recommended a statin holiday to see if this improves his symptoms.  If so, we would need to consider switching to an alternative statin versus referral to the lipid clinic to discuss alternative options such as bempedoic acid or PCSK9 inhibitor therapy.  [name] will contact us after he has been off of atorvastatin for several weeks to report on his symptoms.  If he does not have any significant improvement during the statin holiday, stress testing and ischemia evaluation +/- polysomnography would need to be considered.  Hypertension: Blood pressure mildly elevated today (goal less than 130/80).  We discussed escalation of medical  therapy, though [name] wishes to defer this.  Importance of lifestyle modifications was reinforced.  Hyperlipidemia: LDL above goal on last check.  [name] did not wish to add ezetimibe earlier this year.  We have discussed a statin holiday to see if his fatigue improves.  He will report back to [place], as outlined above, so we can further optimize his lipids and hopefully improve his symptoms.  Follow-up: Return to clinic in 6 months.  Nelva Bush, MD 02/15/2020 10:34 AM

## 2020-02-16 ENCOUNTER — Other Ambulatory Visit: Payer: Self-pay | Admitting: Internal Medicine

## 2020-04-14 ENCOUNTER — Ambulatory Visit: Payer: PPO | Admitting: Dermatology

## 2020-04-15 ENCOUNTER — Other Ambulatory Visit: Payer: Self-pay | Admitting: Family Medicine

## 2020-04-19 ENCOUNTER — Ambulatory Visit: Payer: PPO | Admitting: Dermatology

## 2020-04-19 ENCOUNTER — Other Ambulatory Visit: Payer: Self-pay

## 2020-04-19 DIAGNOSIS — L820 Inflamed seborrheic keratosis: Secondary | ICD-10-CM

## 2020-04-19 DIAGNOSIS — L821 Other seborrheic keratosis: Secondary | ICD-10-CM

## 2020-04-19 DIAGNOSIS — L570 Actinic keratosis: Secondary | ICD-10-CM

## 2020-04-19 DIAGNOSIS — L578 Other skin changes due to chronic exposure to nonionizing radiation: Secondary | ICD-10-CM

## 2020-04-19 DIAGNOSIS — Z85828 Personal history of other malignant neoplasm of skin: Secondary | ICD-10-CM | POA: Diagnosis not present

## 2020-04-19 NOTE — Progress Notes (Signed)
   Follow-Up Visit   Subjective  Christopher Oconnell is a 70 y.o. male who presents for the following: Actinic Keratosis (Of the face and scalp - recheck for any new or persistent skin lesions) and hx of BCC (R ear above crus - recheck for recurrence today ).  The following portions of the chart were reviewed this encounter and updated as appropriate:   Tobacco  Allergies  Meds  Problems  Med Hx  Surg Hx  Fam Hx     Review of Systems:  No other skin or systemic complaints except as noted in HPI or Assessment and Plan.  Objective  Well appearing patient in no apparent distress; mood and affect are within normal limits.  A focused examination was performed including the face and scalp. Relevant physical exam findings are noted in the Assessment and Plan.  Objective  Face x 6, B/L hands x 5 (11): Erythematous thin papules/macules with gritty scale.   Objective  Left Forearm x 3 (3): Erythematous keratotic or waxy stuck-on papule or plaque.   Assessment & Plan  AK (actinic keratosis) (11) Face x 6, B/L hands x 5  Destruction of lesion - Face x 6, B/L hands x 5 Complexity: simple   Destruction method: cryotherapy   Informed consent: discussed and consent obtained   Timeout:  patient name, date of birth, surgical site, and procedure verified Lesion destroyed using liquid nitrogen: Yes   Region frozen until ice ball extended beyond lesion: Yes   Outcome: patient tolerated procedure well with no complications   Post-procedure details: wound care instructions given    Inflamed seborrheic keratosis (3) Left Forearm x 3  Destruction of lesion - Left Forearm x 3 Complexity: simple   Destruction method: cryotherapy   Informed consent: discussed and consent obtained   Timeout:  patient name, date of birth, surgical site, and procedure verified Lesion destroyed using liquid nitrogen: Yes   Region frozen until ice ball extended beyond lesion: Yes   Outcome: patient tolerated  procedure well with no complications   Post-procedure details: wound care instructions given     Actinic Damage - chronic, secondary to cumulative UV radiation exposure/sun exposure over time - diffuse scaly erythematous macules with underlying dyspigmentation - Recommend daily broad spectrum sunscreen SPF 30+ to sun-exposed areas, reapply every 2 hours as needed.  - Call for new or changing lesions.  History of Basal Cell Carcinoma of the Skin - R ear above crus  - No evidence of recurrence today - Recommend regular full body skin exams - Recommend daily broad spectrum sunscreen SPF 30+ to sun-exposed areas, reapply every 2 hours as needed.  - Call if any new or changing lesions are noted between office visits  Seborrheic Keratoses - Stuck-on, waxy, tan-brown papules and plaques  - Discussed benign etiology and prognosis. - Observe - Call for any changes  Return in about 6 months (around 10/17/2020) for TBSE - Hx BCC .  [name], CMA, am acting as scribe for Sarina Ser, MD .  Documentation: I have reviewed the above documentation for accuracy and completeness, and I agree with the above.  Sarina Ser, MD

## 2020-04-23 ENCOUNTER — Encounter: Payer: Self-pay | Admitting: Dermatology

## 2020-04-26 ENCOUNTER — Ambulatory Visit (INDEPENDENT_AMBULATORY_CARE_PROVIDER_SITE_OTHER): Payer: PPO

## 2020-04-26 ENCOUNTER — Other Ambulatory Visit: Payer: Self-pay

## 2020-04-26 DIAGNOSIS — Z0000 Encounter for general adult medical examination without abnormal findings: Secondary | ICD-10-CM

## 2020-04-26 NOTE — Progress Notes (Signed)
Subjective:   Christopher Oconnell is a 70 y.o. male who presents for Medicare Annual/Subsequent preventive examination.  Review of Systems: N/A      I connected with the patient today by telephone and verified that I am speaking with the correct person using two identifiers. Location patient: home Location nurse: work Persons participating in the telephone visit: patient, nurse.   I discussed the limitations, risks, security and privacy concerns of performing an evaluation and management service by telephone and the availability of in person appointments. I also discussed with the patient that there may be a patient responsible charge related to this service. The patient expressed understanding and verbally consented to this telephonic visit.        Cardiac Risk Factors include: advanced age (>38men, >53 women);male gender;diabetes mellitus;hypertension;Other (see comment), Risk factor comments: hyperlipidemia     Objective:    Today's Vitals   There is no height or weight on file to calculate BMI.  Advanced Directives 04/26/2020  Does Patient Have a Medical Advance Directive? No  Does patient want to make changes to medical advance directive? No - Patient declined    Current Medications (verified) Outpatient Encounter Medications as of 04/26/2020  Medication Sig  . amLODipine (NORVASC) 10 MG tablet TAKE 1 TABLET BY MOUTH EVERY DAY  . aspirin EC 81 MG tablet Take 81 mg by mouth daily.  Marland Kitchen atorvastatin (LIPITOR) 80 MG tablet TAKE 1 TABLET BY MOUTH EVERY DAY  . CINNAMON PO Take 200 mg by mouth 2 (two) times daily.  . enalapril (VASOTEC) 20 MG tablet Take 20 mg by mouth daily.  Marland Kitchen FREESTYLE LITE test strip 1 each daily.  . metFORMIN (GLUCOPHAGE) 500 MG tablet Take 1 tablet (500 mg total) by mouth 2 (two) times daily with a meal.  . Multiple Vitamin (MULTIVITAMIN) tablet Take 1 tablet by mouth daily.  . mupirocin ointment (BACTROBAN) 2 % Apply 1 application topically daily. With  dressing changes  . Omega-3 Fatty Acids (FISH OIL PO) Take 1,200 mg by mouth. Takes 1 tablet qd.  . Potassium Gluconate 550 MG TABS Take by mouth daily as needed.  . tobramycin (TOBREX) 0.3 % ophthalmic solution Apply one drop to affected area of right ear once daily after wound cleansing.  . triamterene-hydrochlorothiazide (MAXZIDE-25) 37.5-25 MG tablet Take 1 tablet by mouth every morning.  . Turmeric (QC TUMERIC COMPLEX PO) Take by mouth daily.   No facility-administered encounter medications on file as of 04/26/2020.    Allergies (verified) Patient has no known allergies.   History: Past Medical History:  Diagnosis Date  . Arthritis   . Asthma   . Basal cell carcinoma 11/05/2019   Right ear above crus. Nodular and infiltrative patterns. Excised 01/19/2020, margins free.  . Coronary artery disease   . Diabetes mellitus without complication ([place])   . GERD (gastroesophageal reflux disease)   . Hyperlipidemia   . Hypertension    Past Surgical History:  Procedure Laterality Date  . CORONARY ANGIOPLASTY     No stents  . NOSE SURGERY    . SKIN CANCER EXCISION  01/2020   right ear   . TONSILLECTOMY     Family History  Problem Relation Age of Onset  . Dementia Mother   . Coronary artery disease Father   . Melanoma Father   . Pancreatic cancer Father   . Breast cancer Sister   . Drug abuse Sister   . Cancer Maternal Grandmother        unsure  of the type  . Heart attack Paternal Grandmother   . Heart attack Paternal Grandfather    Social History   Socioeconomic History  . Marital status: Married    Spouse name: Jackelyn Poling  . Number of children: 0  . Years of education: [employer] school  . Highest education level: Not on file  Occupational History  . Not on file  Tobacco Use  . Smoking status: Former Smoker    Packs/day: 3.50    Years: 30.00    Pack years: 105.00    Types: Cigarettes    Quit date: 1993    Years since quitting: 29.1  . Smokeless tobacco: Never Used   Vaping Use  . Vaping Use: Never used  Substance and Sexual Activity  . Alcohol use: Yes    Alcohol/week: 12.0 standard drinks    Types: 10 Cans of beer, 2 Shots of liquor per week    Comment: 15-20 beers on the weekend  . Drug use: Never  . Sexual activity: Not Currently  Other Topics Concern  . Not on file  Social History Narrative   07/30/19   From: the area   Living: with wife, Jackelyn Poling (1986, together since 1981)   Work: Retired, Dealer work in 2013      Family: sisters live nearby, and in-laws are in [place]      Enjoys: hunting, fishing, golf (occasionally)      Exercise: daily activities, yard work   Diet: meat - primarily venison, salads, fish, chicken - no red meat      Safety   Seat belts: Yes    Guns: Yes  and not currently secure   Safe in relationships: Yes    Social Determinants of Radio broadcast assistant Strain: Marie   . Difficulty of Paying Living Expenses: Not hard at all  Food Insecurity: No Food Insecurity  . Worried About Charity fundraiser in the Last Year: Never true  . Ran Out of Food in the Last Year: Never true  Transportation Needs: No Transportation Needs  . Lack of Transportation (Medical): No  . Lack of Transportation (Non-Medical): No  Physical Activity: Inactive  . Days of Exercise per Week: 0 days  . Minutes of Exercise per Session: 0 min  Stress: No Stress Concern Present  . Feeling of Stress : Not at all  Social Connections: Not on file    Tobacco Counseling Counseling given: Not Answered   Clinical Intake:  Pre-visit preparation completed: Yes  Pain : No/denies pain     Nutritional Risks: None Diabetes: Yes CBG done?: No Did pt. 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  No  Does the patient have any non-healing wounds?  No  Has the patient had any unintentional weight loss or weight gain?  No   Diabetes:  Is the patient diabetic?  Yes  If diabetic, was a CBG obtained today?  No  telephone visit  Did the patient bring in their glucometer from home?  No  telephone visit How often do you monitor your CBG's? Twice daily.   Financial Strains and Diabetes Management:  Are you having  any financial strains with the device, your supplies or your medication? No .  Does the patient want to be seen by Chronic Care Management for management of their diabetes?  No  Would the patient like to be referred to a Nutritionist or for Diabetic Management?  No   Diabetic Exams:  Diabetic Eye Exam: Completed 07/02/2019 Diabetic Foot Exam: Completed 07/30/2019   Interpreter Needed?: No  Information entered by :: CJohnson, LPN   Activities of Daily Living In your present state of health, do you have any difficulty performing the following activities: 04/26/2020  Hearing? N  Vision? N  Difficulty concentrating or making decisions? N  Walking or climbing stairs? N  Dressing or bathing? N  Doing errands, shopping? N  Preparing Food and eating ? N  Using the Toilet? N  In the past six months, have you accidently leaked urine? N  Do you have problems with loss of bowel control? Y  Comment some leakage after bowel movements  Managing your Medications? N  Managing your Finances? N  Housekeeping or managing your Housekeeping? N  Some recent data might be hidden    Patient Care Team: Lesleigh Noe, MD as PCP - General (Family Medicine) End, Harrell Gave, MD as PCP - Cardiology (Cardiology)  Indicate any recent Medical Services you may have received from other than [facility] providers in the past year (date may be approximate).     Assessment:   This is a routine wellness  examination for [place].  Hearing/Vision screen  Hearing Screening   125Hz  250Hz  500Hz  1000Hz  2000Hz  3000Hz  4000Hz  6000Hz  8000Hz   Right ear:           Left ear:           Vision Screening Comments: Patient gets annual eye exams   Dietary issues and exercise activities discussed: Current Exercise Habits: The patient does not participate in regular exercise at present, Exercise limited by: None identified  Goals    . Patient Stated     04/26/2020, I will maintain and continue medications as prescribed.       Depression Screen PHQ 2/9 Scores 04/26/2020 07/30/2019  PHQ - 2 Score 0 0  PHQ- 9 Score 0 -    Fall Risk Fall Risk  04/26/2020  Falls in the past year? 0  Number falls in past yr: 0  Injury with Fall? 0  Risk for fall due to : Medication side effect  Follow up Falls evaluation completed;Falls prevention discussed    FALL RISK PREVENTION PERTAINING TO THE HOME:  Any stairs in or around the home? Yes  If so, are there any without handrails? No  Home free of loose throw rugs in walkways, pet beds, electrical cords, etc? Yes  Adequate lighting in your home to reduce risk of falls? Yes   ASSISTIVE DEVICES UTILIZED TO PREVENT FALLS:  Life alert? No  Use of a cane, walker or w/c? No  Grab bars in the bathroom? No  Shower chair or bench in shower? No  Elevated toilet seat or a handicapped toilet? No   TIMED UP AND GO:  Was the test performed? N/A telephone visit .    Cognitive Function: MMSE - Mini Mental State Exam 04/26/2020  Orientation to time 5  Orientation to Place 5  Registration 3  Attention/ Calculation 5  Recall 3  Language- repeat 1       Mini Cog  Mini-Cog screen was completed. Maximum score is 22. A value of 0 denotes this part  of the MMSE was not completed or the patient failed this part of the Mini-Cog screening.  Immunizations Immunization History  Administered Date(s) Administered  . Fluad Quad(high Dose 65+) 12/15/2018  .  Influenza-Unspecified 12/14/2019  . PFIZER(Purple Top)SARS-COV-2 Vaccination 04/27/2019, 05/18/2019  . Pneumococcal Conjugate-13 09/21/2014  . Pneumococcal Polysaccharide-23 09/16/2012  . Zoster Recombinat (Shingrix) 07/24/2018, 09/29/2018    TDAP status: Due, Education has been provided regarding the importance of this vaccine. Advised may receive this vaccine at local pharmacy or Health Dept. Aware to provide a copy of the vaccination record if obtained from local pharmacy or Health Dept. Verbalized acceptance and understanding.  Flu Vaccine status: Up to date  Pneumococcal vaccine status: Up to date  Covid-19 vaccine status: 2 doses completed, booster due  Qualifies for Shingles Vaccine? Yes   Zostavax completed No   Shingrix Completed?: Yes  Screening Tests Health Maintenance  Topic Date Due  . PNA vac Low Risk Adult (2 of 2 - PPSV23) 09/16/2017  . COVID-19 Vaccine (3 - Pfizer risk 4-dose series) 06/15/2019  . TETANUS/TDAP  04/27/2023 (Originally 05/04/1969)  . HEMOGLOBIN A1C  05/06/2020  . OPHTHALMOLOGY EXAM  07/01/2020  . FOOT EXAM  07/29/2020  . COLONOSCOPY (Pts 45-36yrs Insurance coverage will need to be confirmed)  05/08/2025  . INFLUENZA VACCINE  Completed  . Hepatitis C Screening  Completed    Health Maintenance  Health Maintenance Due  Topic Date Due  . PNA vac Low Risk Adult (2 of 2 - PPSV23) 09/16/2017  . COVID-19 Vaccine (3 - Pfizer risk 4-dose series) 06/15/2019    Colorectal cancer screening: Type of screening: Colonoscopy. Completed 05/09/2015. Repeat every 10 years  Lung Cancer Screening: (Low Dose CT Chest recommended if Age 57-80 years, 30 pack-year currently smoking OR have quit w/in 15 years.) does not qualify.   Additional Screening:  Hepatitis C Screening: does qualify; Completed 11/06/2016  Vision Screening: Recommended annual ophthalmology exams for early detection of glaucoma and other disorders of the eye. Is the patient up to date with  their annual eye exam?  Yes  Who is the provider or what is the name of the office in which the patient attends annual eye exams? Dr. Edison Pace  If pt is not established with a provider, would they like to be referred to a provider to establish care? No .   Dental Screening: Recommended annual dental exams for proper oral hygiene  Community Resource Referral / Chronic Care Management: CRR required this visit?  No   CCM required this visit?  No      Plan:     I have personally reviewed and noted the following in the patient's chart:   . Medical and social history . Use of alcohol, tobacco or illicit drugs  . Current medications and supplements . Functional ability and status . Nutritional status . Physical activity . Advanced directives . List of other physicians . Hospitalizations, surgeries, and ER visits in previous 12 months . Vitals . Screenings to include cognitive, depression, and falls . Referrals and appointments  In addition, I have reviewed and discussed with patient certain preventive protocols, quality metrics, and best practice recommendations. A written personalized care plan for preventive services as well as general preventive health recommendations were provided to patient.   Due to this being a telephonic visit, the after visit summary with patients personalized plan was offered to patient via office or my-chart. Patient preferred to pick up at office at next visit or via mychart.   Wynetta Emery,  Dewitt Hoes, LPN   09/26/5499

## 2020-04-26 NOTE — Progress Notes (Signed)
PCP notes:  Health Maintenance: Tdap- decline   Abnormal Screenings: none   Patient concerns: Discuss diabetic medication and need 90 day supply of meds   Nurse concerns: none   Next PCP appt.: 05/10/2020 @ 8:20 am

## 2020-04-26 NOTE — Patient Instructions (Signed)
[name] , Thank you for taking time to come for your Medicare Wellness Visit. I appreciate your ongoing commitment to your health goals. Please review the following plan we discussed and let me know if I can assist you in the future.   Screening recommendations/referrals: Colonoscopy: Up to date, completed 05/09/2015, due 04/2025 Recommended yearly ophthalmology/optometry visit for glaucoma screening and checkup Recommended yearly dental visit for hygiene and checkup  Vaccinations: Influenza vaccine: Up to date, completed 12/14/2019, due 10/2020 Pneumococcal vaccine: Completed series Tdap vaccine: decline-insurance Shingles vaccine: Completed series   Covid-19: 2 doses completed, booster due   Advanced directives: Advance directive discussed with you today. Even though you declined this today please call our office should you change your mind and we can give you the proper paperwork for you to fill out.   Conditions/risks identified: diabetes, hypertension, hyperlipidemia   Next appointment: Follow up in one year for your annual wellness visit.   Preventive Care 10 Years and Older, Male Preventive care refers to lifestyle choices and visits with your health care provider that can promote health and wellness. What does preventive care include?  A yearly physical exam. This is also called an annual well check.  Dental exams once or twice a year.  Routine eye exams. Ask your health care provider how often you should have your eyes checked.  Personal lifestyle choices, including:  Daily care of your teeth and gums.  Regular physical activity.  Eating a healthy diet.  Avoiding tobacco and drug use.  Limiting alcohol use.  Practicing safe sex.  Taking low doses of aspirin every day.  Taking vitamin and mineral supplements as recommended by your health care provider. What happens during an annual well check? The services and screenings done by your health care provider  during your annual well check will depend on your age, overall health, lifestyle risk factors, and family history of disease. Counseling  Your health care provider may ask you questions about your:  Alcohol use.  Tobacco use.  Drug use.  Emotional well-being.  Home and relationship well-being.  Sexual activity.  Eating habits.  History of falls.  Memory and ability to understand (cognition).  Work and work Statistician. Screening  You may have the following tests or measurements:  Height, weight, and BMI.  Blood pressure.  Lipid and cholesterol levels. These may be checked every 5 years, or more frequently if you are over 41 years old.  Skin check.  Lung cancer screening. You may have this screening every year starting at age 44 if you have a 30-pack-year history of smoking and currently smoke or have quit within the past 15 years.  Fecal occult blood test (FOBT) of the stool. You may have this test every year starting at age 1.  Flexible sigmoidoscopy or colonoscopy. You may have a sigmoidoscopy every 5 years or a colonoscopy every 10 years starting at age 9.  Prostate cancer screening. Recommendations will vary depending on your family history and other risks.  Hepatitis C blood test.  Hepatitis B blood test.  Sexually transmitted disease (STD) testing.  Diabetes screening. This is done by checking your blood sugar (glucose) after you have not eaten for a while (fasting). You may have this done every 1-3 years.  Abdominal aortic aneurysm (AAA) screening. You may need this if you are a current or former smoker.  Osteoporosis. You may be screened starting at age 50 if you are at high risk. Talk with your health care provider about your  test results, treatment options, and if necessary, the need for more tests. Vaccines  Your health care provider may recommend certain vaccines, such as:  Influenza vaccine. This is recommended every year.  Tetanus,  diphtheria, and acellular pertussis (Tdap, Td) vaccine. You may need a Td booster every 10 years.  Zoster vaccine. You may need this after age 18.  Pneumococcal 13-valent conjugate (PCV13) vaccine. One dose is recommended after age 41.  Pneumococcal polysaccharide (PPSV23) vaccine. One dose is recommended after age 49. Talk to your health care provider about which screenings and vaccines you need and how often you need them. This information is not intended to replace advice given to you by your health care provider. Make sure you discuss any questions you have with your health care provider. Document Released: 03/25/2015 Document Revised: 11/16/2015 Document Reviewed: 12/28/2014 Elsevier Interactive Patient Education  2017 [employer] Prevention in the Home Falls can cause injuries. They can happen to people of all ages. There are many things you can do to make your home safe and to help prevent falls. What can I do on the outside of my home?  Regularly fix the edges of walkways and driveways and fix any cracks.  Remove anything that might make you trip as you walk through a door, such as a raised step or threshold.  Trim any bushes or trees on the path to your home.  Use bright outdoor lighting.  Clear any walking paths of anything that might make someone trip, such as rocks or tools.  Regularly check to see if handrails are loose or broken. Make sure that both sides of any steps have handrails.  Any raised decks and porches should have guardrails on the edges.  Have any leaves, snow, or ice cleared regularly.  Use sand or salt on walking paths during winter.  Clean up any spills in your garage right away. This includes oil or grease spills. What can I do in the bathroom?  Use night lights.  Install grab bars by the toilet and in the tub and shower. Do not use towel bars as grab bars.  Use non-skid mats or decals in the tub or shower.  If you need to sit down in  the shower, use a plastic, non-slip stool.  Keep the floor dry. Clean up any water that spills on the floor as soon as it happens.  Remove soap buildup in the tub or shower regularly.  Attach bath mats securely with double-sided non-slip rug tape.  Do not have throw rugs and other things on the floor that can make you trip. What can I do in the bedroom?  Use night lights.  Make sure that you have a light by your bed that is easy to reach.  Do not use any sheets or blankets that are too big for your bed. They should not hang down onto the floor.  Have a firm chair that has side arms. You can use this for support while you get dressed.  Do not have throw rugs and other things on the floor that can make you trip. What can I do in the kitchen?  Clean up any spills right away.  Avoid walking on wet floors.  Keep items that you use a lot in easy-to-reach places.  If you need to reach something above you, use a strong step stool that has a grab bar.  Keep electrical cords out of the way.  Do not use floor polish or wax that  makes floors slippery. If you must use wax, use non-skid floor wax.  Do not have throw rugs and other things on the floor that can make you trip. What can I do with my stairs?  Do not leave any items on the stairs.  Make sure that there are handrails on both sides of the stairs and use them. Fix handrails that are broken or loose. Make sure that handrails are as long as the stairways.  Check any carpeting to make sure that it is firmly attached to the stairs. Fix any carpet that is loose or worn.  Avoid having throw rugs at the top or bottom of the stairs. If you do have throw rugs, attach them to the floor with carpet tape.  Make sure that you have a light switch at the top of the stairs and the bottom of the stairs. If you do not have them, ask someone to add them for you. What else can I do to help prevent falls?  Wear shoes that:  Do not have high  heels.  Have rubber bottoms.  Are comfortable and fit you well.  Are closed at the toe. Do not wear sandals.  If you use a stepladder:  Make sure that it is fully opened. Do not climb a closed stepladder.  Make sure that both sides of the stepladder are locked into place.  Ask someone to hold it for you, if possible.  Clearly mark and make sure that you can see:  Any grab bars or handrails.  First and last steps.  Where the edge of each step is.  Use tools that help you move around (mobility aids) if they are needed. These include:  Canes.  Walkers.  Scooters.  Crutches.  Turn on the lights when you go into a dark area. Replace any light bulbs as soon as they burn out.  Set up your furniture so you have a clear path. Avoid moving your furniture around.  If any of your floors are uneven, fix them.  If there are any pets around you, be aware of where they are.  Review your medicines with your doctor. Some medicines can make you feel dizzy. This can increase your chance of falling. Ask your doctor what other things that you can do to help prevent falls. This information is not intended to replace advice given to you by your health care provider. Make sure you discuss any questions you have with your health care provider. Document Released: 12/23/2008 Document Revised: 08/04/2015 Document Reviewed: 04/02/2014 Elsevier Interactive Patient Education  2017 [employer].

## 2020-04-27 ENCOUNTER — Encounter: Payer: PPO | Admitting: Family Medicine

## 2020-05-10 ENCOUNTER — Ambulatory Visit (INDEPENDENT_AMBULATORY_CARE_PROVIDER_SITE_OTHER): Payer: PPO | Admitting: Family Medicine

## 2020-05-10 ENCOUNTER — Other Ambulatory Visit: Payer: Self-pay

## 2020-05-10 ENCOUNTER — Encounter: Payer: Self-pay | Admitting: Family Medicine

## 2020-05-10 VITALS — BP 126/60 | HR 77 | Temp 97.7°F | Ht 67.5 in | Wt 208.0 lb

## 2020-05-10 DIAGNOSIS — Z87891 Personal history of nicotine dependence: Secondary | ICD-10-CM

## 2020-05-10 DIAGNOSIS — Z23 Encounter for immunization: Secondary | ICD-10-CM

## 2020-05-10 DIAGNOSIS — Z13228 Encounter for screening for other metabolic disorders: Secondary | ICD-10-CM | POA: Diagnosis not present

## 2020-05-10 DIAGNOSIS — Z125 Encounter for screening for malignant neoplasm of prostate: Secondary | ICD-10-CM

## 2020-05-10 DIAGNOSIS — Z136 Encounter for screening for cardiovascular disorders: Secondary | ICD-10-CM

## 2020-05-10 DIAGNOSIS — E785 Hyperlipidemia, unspecified: Secondary | ICD-10-CM | POA: Diagnosis not present

## 2020-05-10 DIAGNOSIS — Z0000 Encounter for general adult medical examination without abnormal findings: Secondary | ICD-10-CM | POA: Diagnosis not present

## 2020-05-10 DIAGNOSIS — E1149 Type 2 diabetes mellitus with other diabetic neurological complication: Secondary | ICD-10-CM | POA: Diagnosis not present

## 2020-05-10 LAB — LIPID PANEL
Cholesterol: 144 mg/dL (ref 0–200)
HDL: 37.8 mg/dL — ABNORMAL LOW (ref >39.00)
LDL Cholesterol: 75 mg/dL (ref 0–99)
NonHDL: 105.84
Total CHOL/HDL Ratio: 4
Triglycerides: 156 mg/dL — ABNORMAL HIGH (ref 0.0–149.0)
VLDL: 31.2 mg/dL (ref 0.0–40.0)

## 2020-05-10 LAB — HEMOGLOBIN A1C: Hgb A1c MFr Bld: 6.9 % — ABNORMAL HIGH (ref 4.6–6.5)

## 2020-05-10 LAB — COMPREHENSIVE METABOLIC PANEL
ALT: 13 U/L (ref 0–53)
AST: 12 U/L (ref 0–37)
Albumin: 4.5 g/dL (ref 3.5–5.2)
Alkaline Phosphatase: 65 U/L (ref 39–117)
BUN: 13 mg/dL (ref 6–23)
CO2: 29 mEq/L (ref 19–32)
Calcium: 9.6 mg/dL (ref 8.4–10.5)
Chloride: 100 mEq/L (ref 96–112)
Creatinine, Ser: 0.88 mg/dL (ref 0.40–1.50)
GFR: 86.81 mL/min (ref >60.00)
Glucose, Bld: 147 mg/dL — ABNORMAL HIGH (ref 70–99)
Potassium: 4.3 mEq/L (ref 3.5–5.1)
Sodium: 136 mEq/L (ref 135–145)
Total Bilirubin: 0.7 mg/dL (ref 0.2–1.2)
Total Protein: 6.6 g/dL (ref 6.0–8.3)

## 2020-05-10 LAB — PSA: PSA: 0.52 ng/mL (ref 0.10–4.00)

## 2020-05-10 NOTE — Assessment & Plan Note (Signed)
He has been taking atorvastatin 40 mg daily. Will recheck lipids and increase dose if needed

## 2020-05-10 NOTE — Patient Instructions (Signed)
Pneumonia Shot today   Check with insurance on the Abdominal Aortic Ultrasound screening  Labs today  Work on healthy diet and exercise  Consider checking blood sugar 2 hours after you eat goal <180.

## 2020-05-10 NOTE — Progress Notes (Signed)
Annual Exam   Chief Complaint:  Chief Complaint  Patient presents with  . Medicare Wellness    Part 2     History of Present Illness:  Christopher Oconnell is a 70 y.o. presents today for annual examination.    Diabetes: CBG 97-160  Pt has decreased cholesterol medication to see if that improves his energy  Nutrition/Lifestyle Diet: ok with diabetic diet, cheats on the days he doesn't check Exercise: not currently He is not sexually active.    Social History   Tobacco Use  Smoking Status Former Smoker  . Packs/day: 3.50  . Years: 30.00  . Pack years: 105.00  . Types: Cigarettes  . Quit date: 40  . Years since quitting: 29.1  Smokeless Tobacco Never Used   Social History   Substance and Sexual Activity  Alcohol Use Yes   Comment: 15 beers on the weekends   Social History   Substance and Sexual Activity  Drug Use Never     Safety The patient wears seatbelts: yes.     The patient feels safe at home and in their relationships: yes.  General Health Dentist in the last year: Yes Eye doctor: yes  Weight Wt Readings from Last 3 Encounters:  05/10/20 208 lb (94.3 kg)  02/15/20 211 lb (95.7 kg)  10/28/19 210 lb (95.3 kg)   Patient has high BMI  BMI Readings from Last 1 Encounters:  05/10/20 32.10 kg/m     Chronic disease screening Blood pressure monitoring:  BP Readings from Last 3 Encounters:  05/10/20 126/60  02/15/20 132/68  10/28/19 (!) 110/58    Lipid Monitoring: Indication for screening: age >35, obesity, diabetes, family hx, CV risk factors.  Lipid screening: Yes  Lab Results  Component Value Date   CHOL 174 04/27/2019   HDL 44 04/27/2019   LDLCALC 100 (H) 04/27/2019   TRIG 151 (H) 04/27/2019   CHOLHDL 4.0 04/27/2019     Diabetes Screening: age >72, overweight, family hx, PCOS, hx of gestational diabetes, at risk ethnicity, elevated blood pressure >135/80.  Diabetes Screening screening: Yes  Lab Results  Component Value Date    HGBA1C 6.7 (H) 11/04/2019     Prostate Cancer Screening: Yes Age 45-69 yo Shared Decision Making Higher Risk: Older age, African American, Family Hx of Prostate Cancer - No Benefits: screening may prevent 1.3 deaths from prostate cancer over 13 years per 1000 men screened and prevent 3 metastatic cases per 1000 men screened. Not enough evidence to support more benefit for AA or [place] Harms: False Positive and psychological harms. 15% of me with false positive over a 2 to 4 year period > resulting in biopsy and complications such as pain, hematospermia, infections. Overdiagnosis - increases with age - found that 20-50% of prostate cancer through screening may have never caused any issues. Harms of treatment include - erectile dysfunction, urinary incontinence, and bothersome bowel symptoms.   After discussion he does want to get a PSA checked today.   Inadequate evidence for screening <55 No mortality benefit for screening >70   Lab Results  Component Value Date   PSA 0.57 05/22/2018       Colon Cancer Screening:  Age 49-75 yo - benefits outweigh the risk. Adults 55-85 yo who have never been screened benefit.  Benefits: 134000 people in 2016 will be diagnosed and 49,000 will die - early detection helps Harms: Complications 2/2 to colonoscopy High Risk (Colonoscopy): genetic disorder (Lynch syndrome or familial adenomatous polyposis), personal hx of IBD,  previous adenomatous polyp, or previous colorectal cancer, FamHx start 10 years before the age at diagnosis, increased in males and black race  Options:  FIT - looks for hemoglobin (blood in the stool) - specific and fairly sensitive - must be done annually Cologuard - looks for DNA and blood - more sensitive - therefore can have more false positives, every 3 years Colonoscopy - every 10 years if normal - sedation, bowl prep, must have someone drive you  Shared decision making and the patient had decided to do colonoscopy - due for 5  year follow-up.   Social History   Tobacco Use  Smoking Status Former Smoker  . Packs/day: 3.50  . Years: 30.00  . Pack years: 105.00  . Types: Cigarettes  . Quit date: 16  . Years since quitting: 29.1  Smokeless Tobacco Never Used    Lung Cancer Screening (Ages 29-52): not applicable  Abdominal Aortic Aneurysm:  Age 6-75, 1 time screening, men who have ever smoked discussed, ordered    Past Medical History:  Diagnosis Date  . Arthritis   . Asthma   . Basal cell carcinoma 11/05/2019   Right ear above crus. Nodular and infiltrative patterns. Excised 01/19/2020, margins free.  . Coronary artery disease   . Diabetes mellitus without complication ([place])   . GERD (gastroesophageal reflux disease)   . Hyperlipidemia   . Hypertension     Past Surgical History:  Procedure Laterality Date  . CORONARY ANGIOPLASTY     No stents  . NOSE SURGERY    . SKIN CANCER EXCISION  01/2020   right ear   . TONSILLECTOMY      Prior to Admission medications   Medication Sig Start Date End Date Taking? Authorizing Provider  amLODipine (NORVASC) 10 MG tablet TAKE 1 TABLET BY MOUTH EVERY DAY 02/16/20  Yes End, Harrell Gave, MD  aspirin EC 81 MG tablet Take 81 mg by mouth daily.   Yes [provider]  atorvastatin (LIPITOR) 80 MG tablet TAKE 1 TABLET BY MOUTH EVERY DAY Patient taking differently: Take 40 mg by mouth daily. 04/15/20  Yes Lesleigh Noe, MD  CINNAMON PO Take 200 mg by mouth 2 (two) times daily.   Yes [provider]  enalapril (VASOTEC) 20 MG tablet Take 20 mg by mouth daily. 12/13/18  Yes [provider]  FREESTYLE LITE test strip 1 each daily. 05/28/19  Yes [provider]  metFORMIN (GLUCOPHAGE) 500 MG tablet Take 1 tablet (500 mg total) by mouth 2 (two) times daily with a meal. 07/30/19  Yes Lesleigh Noe, MD  Multiple Vitamin (MULTIVITAMIN) tablet Take 1 tablet by mouth daily.   Yes [provider]  mupirocin ointment  (BACTROBAN) 2 % Apply 1 application topically daily. With dressing changes 01/19/20  Yes Ralene Bathe, MD  Omega-3 Fatty Acids (FISH OIL PO) Take 1,200 mg by mouth. Takes 1 tablet qd.   Yes [provider]  Potassium Gluconate 550 MG TABS Take by mouth daily as needed.   Yes [provider]  tobramycin (TOBREX) 0.3 % ophthalmic solution Apply one drop to affected area of right ear once daily after wound cleansing. 01/26/20  Yes Ralene Bathe, MD  triamterene-hydrochlorothiazide (MAXZIDE-25) 37.5-25 MG tablet Take 1 tablet by mouth every morning. 01/26/19  Yes [provider]  Turmeric (QC TUMERIC COMPLEX PO) Take by mouth daily.   Yes [provider]    No Known Allergies   Social History   Socioeconomic History  .  Marital status: Married    Spouse name: Jackelyn Poling  . Number of children: 0  . Years of education: [employer] school  . Highest education level: Not on file  Occupational History  . Not on file  Tobacco Use  . Smoking status: Former Smoker    Packs/day: 3.50    Years: 30.00    Pack years: 105.00    Types: Cigarettes    Quit date: 1993    Years since quitting: 29.1  . Smokeless tobacco: Never Used  Vaping Use  . Vaping Use: Never used  Substance and Sexual Activity  . Alcohol use: Yes    Comment: 15 beers on the weekends  . Drug use: Never  . Sexual activity: Not Currently  Other Topics Concern  . Not on file  Social History Narrative   07/30/19   From: the area   Living: with wife, Jackelyn Poling (1986, together since 1981)   Work: Retired, Dealer work in 2013      Family: sisters live nearby, and in-laws are in [place]      Enjoys: hunting, fishing, golf (occasionally)      Exercise: daily activities, yard work   Diet: meat - primarily venison, salads, fish, chicken - no red meat      Safety   Seat belts: Yes    Guns: Yes  and not currently secure   Safe in relationships: Yes    Social Determinants of Adult nurse Strain: H. Rivera Colon   . Difficulty of Paying Living Expenses: Not hard at all  Food Insecurity: No Food Insecurity  . Worried About Charity fundraiser in the Last Year: Never true  . Ran Out of Food in the Last Year: Never true  Transportation Needs: No Transportation Needs  . Lack of Transportation (Medical): No  . Lack of Transportation (Non-Medical): No  Physical Activity: Inactive  . Days of Exercise per Week: 0 days  . Minutes of Exercise per Session: 0 min  Stress: No Stress Concern Present  . Feeling of Stress : Not at all  Social Connections: Not on file  Intimate Partner Violence: Not At Risk  . Fear of Current or Ex-Partner: No  . Emotionally Abused: No  . Physically Abused: No  . Sexually Abused: No    Family History  Problem Relation Age of Onset  . Dementia Mother   . Coronary artery disease Father   . Melanoma Father   . Pancreatic cancer Father   . Breast cancer Sister   . Drug abuse Sister   . Cancer Maternal Grandmother        unsure of the type  . Heart attack Paternal Grandmother   . Heart attack Paternal Grandfather     Review of Systems  Constitutional: Negative for chills and fever.  HENT: Negative for congestion and sore throat.   Eyes: Negative for blurred vision and double vision.  Respiratory: Negative for shortness of breath.   Cardiovascular: Negative for chest pain.  Gastrointestinal: Negative for heartburn, nausea and vomiting.  Genitourinary: Negative.   Musculoskeletal: Negative.  Negative for myalgias.  Skin: Negative for rash.  Neurological: Negative for dizziness and headaches.  Endo/Heme/Allergies: Does not bruise/bleed easily.  Psychiatric/Behavioral: Negative for depression. The patient is not nervous/anxious.      Physical Exam BP 126/60   Pulse 77   Temp 97.7 F (36.5 C) (Temporal)   Ht 5' 7.5" (1.715 m)   Wt 208 lb (94.3 kg)   SpO2 97%  BMI 32.10 kg/m    BP Readings from Last 3 Encounters:   05/10/20 126/60  02/15/20 132/68  10/28/19 (!) 110/58      Physical Exam Constitutional:      General: He is not in acute distress.    Appearance: He is well-developed and well-nourished. He is not diaphoretic.  HENT:     Head: Normocephalic and atraumatic.     Right Ear: Tympanic membrane and ear canal normal.     Left Ear: Tympanic membrane and ear canal normal.     Nose: Nose normal.     Mouth/Throat:     Mouth: Oropharynx is clear and moist.     Pharynx: Uvula midline.  Eyes:     General: No scleral icterus.    Extraocular Movements: EOM normal.     Conjunctiva/sclera: Conjunctivae normal.     Pupils: Pupils are equal, round, and reactive to light.  Cardiovascular:     Rate and Rhythm: Normal rate and regular rhythm.     Heart sounds: Normal heart sounds. No murmur heard.   Pulmonary:     Effort: Pulmonary effort is normal. No respiratory distress.     Breath sounds: Normal breath sounds. No wheezing.  Abdominal:     General: Bowel sounds are normal. There is no distension.     Palpations: Abdomen is soft. There is no mass.     Tenderness: There is no abdominal tenderness. There is no guarding.  Musculoskeletal:        General: Normal range of motion.     Cervical back: Normal range of motion and neck supple.  Lymphadenopathy:     Cervical: No cervical adenopathy.  Skin:    General: Skin is warm and dry.     Capillary Refill: Capillary refill takes less than 2 seconds.  Neurological:     Mental Status: He is alert and oriented to person, place, and time.  Psychiatric:        Mood and Affect: Mood and affect normal.        Results:  PHQ-9:  Flowsheet Row Clinical Support from 04/26/2020 in [place] at [facility]  PHQ-9 Total Score 0        Assessment: 70 y.o. here for routine annual physical examination.  Plan: Problem List Items Addressed This Visit      Endocrine   Type 2 diabetes mellitus with neurological complications ([place])    Relevant Orders   Hemoglobin A1c     Other   Hyperlipidemia LDL goal <70    He has been taking atorvastatin 40 mg daily. Will recheck lipids and increase dose if needed       Other Visit Diagnoses    Encounter for annual physical exam    -  Primary   History of tobacco use       Relevant Orders   VAS [place] AAA DUPLEX   Screening for AAA (abdominal aortic aneurysm)       Relevant Orders   VAS [place] AAA DUPLEX   Encounter for special screening examination for cardiovascular disorder       Relevant Orders   Lipid panel   Screening for metabolic disorder       Relevant Orders   Comprehensive metabolic panel   Prostate cancer screening       Relevant Orders   PSA   Need for vaccination against Streptococcus pneumoniae       Relevant Orders   Pneumococcal polysaccharide vaccine 23-valent greater than or equal to  2yo subcutaneous/IM (Completed)      Screening: -- Blood pressure screen normal -- cholesterol screening: will obtain -- Weight screening: overweight: continue to monitor -- Diabetes Screening: will obtain -- Nutrition: normal - Encouraged healthy diet  The 10-year ASCVD risk score Mikey Bussing DC Jr., et al., 2013) is: 37.3%   Values used to calculate the score:     Age: 7 years     Sex: Male     Is Non-Hispanic African American: No     Diabetic: Yes     Tobacco smoker: No     Systolic Blood Pressure: 237 mmHg     Is BP treated: Yes     HDL Cholesterol: 44 mg/dL     Total Cholesterol: 174 mg/dL  -- ASA 81 mg discussed if CVD risk >10% age 69-59 and willing to take for 10 years -- Statin therapy for Age 46-75 with CVD risk >7.5%  Psych -- Depression screening (PHQ-9): negative  Safety -- tobacco screening: not using -- alcohol screening:  Encouraged continuing to reduce use - he has cut back to 15/week on the weekends only -- no evidence of domestic violence or intimate partner violence.  Cancer Screening -- Prostate (age 39-69) ordered -- Colon (age 61-75) he  will call to schedule -- Lung not indicated   Immunizations Immunization History  Administered Date(s) Administered  . Fluad Quad(high Dose 65+) 12/15/2018  . Influenza-Unspecified 12/14/2019  . PFIZER(Purple Top)SARS-COV-2 Vaccination 04/27/2019, 05/18/2019  . Pneumococcal Conjugate-13 09/21/2014  . Pneumococcal Polysaccharide-23 09/16/2012, 05/10/2020  . Zoster Recombinat (Shingrix) 07/24/2018, 09/29/2018    -- flu vaccine up to date -- TDAP q10 years will get records -- Shingles (age >27) up to date -- PPSV-23 (19-64 with chronic disease or smoking) up to date -- PCV-13 (age >10) - one dose followed by PPSV-23 1 year later up to date -- Covid-19 Vaccine up to date  Encouraged regular vision and dental screening. Encouraged healthy exercise and diet.   Lesleigh Noe

## 2020-05-11 ENCOUNTER — Telehealth: Payer: Self-pay

## 2020-05-11 DIAGNOSIS — Z87891 Personal history of nicotine dependence: Secondary | ICD-10-CM

## 2020-05-11 DIAGNOSIS — E785 Hyperlipidemia, unspecified: Secondary | ICD-10-CM

## 2020-05-11 NOTE — Telephone Encounter (Signed)
Patient states that he was suppose to let Dr Einar Pheasant know how he is taking Enalapril. He verified at home and that he is only taking it 1 tablet daily and states his b/p was good. He would like to have this updated in the chart and send this in for refill when due with the right directions.  Also, wanted to let Dr Einar Pheasant know that his Atorvastatin he was taking 80 mg 1/2 tablet daily because the 80 mg was giving him issue of been extra tired/fatigue. If this is ok to continue would like his chart updated to just 40 mg 1 tablet daily. (I pulled down Atorvastatin 40 mg dose for approval to update in the chart if it is ok, RX will not go to the pharmacy)  Patient does not need any refills at this time. Also, I spoke with patient about scheduling Vascular AAA Duplex and he said that he discussed with Dr Einar Pheasant that he wanted to check with his insurance on this first. Advised patient to just let me know when he wants to proceed with that.  Patient said if need to call him back with any feedback and he does not answer we can leave a message with the information on his machine.

## 2020-05-18 NOTE — Telephone Encounter (Signed)
Patient left a voicemail stating that he needs to have an ultrasound done and was told to check with his insurance to determine how much it would cost him. Patient stated when he talked with the insurance company they told him that they needed a code in order to know what the charge would be and what his part would be. Patient is requesting that our office call [employer] and give them the code that they need in order to determine what the cost to him would be.

## 2020-05-19 DIAGNOSIS — Z87891 Personal history of nicotine dependence: Secondary | ICD-10-CM | POA: Insufficient documentation

## 2020-05-19 MED ORDER — ATORVASTATIN CALCIUM 40 MG PO TABS: 40.0000 mg | ORAL_TABLET | Freq: Every day | ORAL | 3 refills | Status: DC

## 2020-05-19 NOTE — Telephone Encounter (Signed)
AAA screening US already ordered.   Dx: screening for abdominal aortic aneurysm and Former smoker - Code: [id number]

## 2020-05-19 NOTE — Telephone Encounter (Signed)
Called insurance and found out patient's responsibility will be $75 and no pre Josem Kaufmann is required. CPT code [phone]. [facility] does these tests. Spoke with patient. He would like to hold off on this till about October or so and will call us back when he is ready.  FYI to Dr Einar Pheasant

## 2020-07-06 DIAGNOSIS — H2513 Age-related nuclear cataract, bilateral: Secondary | ICD-10-CM | POA: Diagnosis not present

## 2020-07-06 LAB — HM DIABETES EYE EXAM

## 2020-07-11 ENCOUNTER — Other Ambulatory Visit: Payer: Self-pay | Admitting: Family Medicine

## 2020-07-18 ENCOUNTER — Other Ambulatory Visit: Payer: Self-pay | Admitting: Family Medicine

## 2020-08-11 ENCOUNTER — Other Ambulatory Visit: Payer: Self-pay | Admitting: Internal Medicine

## 2020-08-17 ENCOUNTER — Ambulatory Visit: Payer: PPO | Admitting: Internal Medicine

## 2020-08-17 ENCOUNTER — Encounter: Payer: Self-pay | Admitting: Internal Medicine

## 2020-08-17 ENCOUNTER — Other Ambulatory Visit: Payer: Self-pay

## 2020-08-17 VITALS — BP 124/70 | HR 69 | Ht 70.0 in | Wt 210.0 lb

## 2020-08-17 DIAGNOSIS — E1169 Type 2 diabetes mellitus with other specified complication: Secondary | ICD-10-CM

## 2020-08-17 DIAGNOSIS — I10 Essential (primary) hypertension: Secondary | ICD-10-CM | POA: Diagnosis not present

## 2020-08-17 DIAGNOSIS — E785 Hyperlipidemia, unspecified: Secondary | ICD-10-CM | POA: Insufficient documentation

## 2020-08-17 DIAGNOSIS — I2510 Atherosclerotic heart disease of native coronary artery without angina pectoris: Secondary | ICD-10-CM

## 2020-08-17 NOTE — Patient Instructions (Signed)
Medication Instructions:  Your physician recommends that you continue on your current medications as directed. Please refer to the Current Medication list given to you today.  *If you need a refill on your cardiac medications before your next appointment, please call your pharmacy*   Lab Work: None ordered If you have labs (blood work) drawn today and your tests are completely normal, you will receive your results only by: Marland Kitchen MyChart Message (if you have MyChart) OR . A paper copy in the mail If you have any lab test that is abnormal or we need to change your treatment, we will call you to review the results.   Testing/Procedures: None ordered   Follow-Up: At [facility], you and your health needs are our priority.  As part of our continuing mission to provide you with exceptional heart care, we have created designated Provider Care Teams.  These Care Teams include your primary Cardiologist (physician) and Advanced Practice Providers (APPs -  Physician Assistants and Nurse Practitioners) who all work together to provide you with the care you need, when you need it.  We recommend signing up for the patient portal called "MyChart".  Sign up information is provided on this After Visit Summary.  MyChart is used to connect with patients for Virtual Visits (Telemedicine).  Patients are able to view lab/test results, encounter notes, upcoming appointments, etc.  Non-urgent messages can be sent to your provider as well.   To learn more about what you can do with MyChart, go to [email or web address].    Your next appointment:   Your physician wants you to follow-up in: 1 year You will receive a reminder letter in the mail two months in advance. If you don't receive a letter, please call our office to schedule the follow-up appointment.   The format for your next appointment:   In Person  Provider:   You may see Nelva Bush, MD or one of the following Advanced Practice Providers on your  designated Care Team:    Murray Hodgkins, NP  Christell Faith, PA-C  Marrianne Mood, PA-C  Cadence Kathlen Mody, [place]  Laurann Montana, NP    Other Instructions Your physician discussed the importance of regular exercise and recommended that you start or continue a regular exercise program for good health.

## 2020-08-17 NOTE — Progress Notes (Signed)
Follow-up Outpatient Visit Date: 08/17/2020  Primary Care Provider: Lesleigh Noe, [place] [id number]  Chief Complaint: Follow-up coronary artery disease  HPI:  Christopher Oconnell is a 70 y.o. male with history of coronary artery disease status post angioplasty to D1 in 1993, hypertension, hyperlipidemia, and type 2 diabetes mellitus, who presents for follow-up of coronary artery disease.  I last saw him in 02/2020, at which time Christopher Oconnell reported continued fatigue and achiness.  We agreed to proceed with a statin holiday.  Today, Christopher Oconnell reports that he has experienced improvement in his tiredness with de-escalation of atorvastatin to 40 mg daily since our last visit.  Lipid panel in March was notable for mildly elevated triglycerides and LDL just above goal at 75.  Christopher Oconnell notes that he does not exercise regularly.  He has not had any chest pain, shortness of breath, palpitations, lightheadedness, or edema.  AAA screening was ordered by Dr. Einar Pheasant, though Christopher Oconnell decided to hold off on this due to his co-pay.  --------------------------------------------------------------------------------------------------  Past Medical History:  Diagnosis Date  . Arthritis   . Asthma   . Basal cell carcinoma 11/05/2019   Right ear above crus. Nodular and infiltrative patterns. Excised 01/19/2020, margins free.  . Coronary artery disease   . Diabetes mellitus without complication ([place])   . GERD (gastroesophageal reflux disease)   . Hyperlipidemia   . Hypertension    Past Surgical History:  Procedure Laterality Date  . CORONARY ANGIOPLASTY     No stents  . NOSE SURGERY    . SKIN CANCER EXCISION  01/2020   right ear   . TONSILLECTOMY      Recent CV Pertinent Labs: Lab Results  Component Value Date   CHOL 144 05/10/2020   HDL 37.80 (L) 05/10/2020   LDLCALC 75 05/10/2020   TRIG 156.0 (H) 05/10/2020   CHOLHDL 4 05/10/2020   K 4.3 05/10/2020    BUN 13 05/10/2020   BUN 12 05/22/2018   CREATININE 0.88 05/10/2020    Past medical and surgical history were reviewed and updated in EPIC.  Current Meds  Medication Sig  . amLODipine (NORVASC) 10 MG tablet TAKE 1 TABLET BY MOUTH EVERY DAY  . aspirin EC 81 MG tablet Take 81 mg by mouth daily.  Marland Kitchen atorvastatin (LIPITOR) 40 MG tablet Take 1 tablet (40 mg total) by mouth daily.  Marland Kitchen CINNAMON PO Take 200 mg by mouth 2 (two) times daily.  . enalapril (VASOTEC) 20 MG tablet Take 20 mg by mouth daily.  Marland Kitchen FREESTYLE LITE test strip 1 each daily.  . metFORMIN (GLUCOPHAGE) 500 MG tablet TAKE 1 TABLET (500 MG TOTAL) BY MOUTH 2 (TWO) TIMES DAILY WITH A MEAL.  . Multiple Vitamin (MULTIVITAMIN) tablet Take 1 tablet by mouth daily.  . Omega-3 Fatty Acids (FISH OIL PO) Take 1,200 mg by mouth. Takes 1 tablet qd.  . Potassium Gluconate 550 MG TABS Take by mouth daily as needed.  . triamterene-hydrochlorothiazide (MAXZIDE-25) 37.5-25 MG tablet Take 1 tablet by mouth every morning.  . Turmeric (QC TUMERIC COMPLEX PO) Take by mouth daily.    Allergies: Patient has no known allergies.  Social History   Tobacco Use  . Smoking status: Former Smoker    Packs/day: 3.50    Years: 30.00    Pack years: 105.00    Types: Cigarettes    Quit date: 1993    Years since quitting: 29.4  . Smokeless tobacco: Never Used  Vaping Use  . Vaping Use: Never used  Substance Use Topics  . Alcohol use: Yes    Comment: 15 beers on the weekends  . Drug use: Never    Family History  Problem Relation Age of Onset  . Dementia Mother   . Coronary artery disease Father   . Melanoma Father   . Pancreatic cancer Father   . Breast cancer Sister   . Drug abuse Sister   . Cancer Maternal Grandmother        unsure of the type  . Heart attack Paternal Grandmother   . Heart attack Paternal Grandfather     Review of Systems: A 12-system review of systems was performed and was negative except as noted in the  HPI.  --------------------------------------------------------------------------------------------------  Physical Exam: BP 124/70 (BP Location: Left Arm, Patient Position: Sitting, Cuff Size: Large)   Pulse 69   Ht 5\' 10"  (1.778 m)   Wt 210 lb (95.3 kg)   SpO2 98%   BMI 30.13 kg/m   General:  NAD. Neck: No JVD or HJR. Lungs: Clear to auscultation bilaterally without wheezes or crackles. Heart: Regular rate and rhythm without murmurs, rubs, or gallops. Abdomen: Soft, nontender, nondistended. Extremities: Trace ankle edema with varicose veins noted.  EKG: Normal sinus rhythm with sinus arrhythmia.  Compared with prior tracing from 02/15/2020, PACs are no longer present.  Lab Results  Component Value Date   WBC 14.5 (H) 12/07/2009   HGB 16.5 12/07/2009   HCT 47.9 12/07/2009   MCV 93.2 12/07/2009   PLT 206 12/07/2009    Lab Results  Component Value Date   NA 136 05/10/2020   K 4.3 05/10/2020   CL 100 05/10/2020   CO2 29 05/10/2020   BUN 13 05/10/2020   CREATININE 0.88 05/10/2020   GLUCOSE 147 (H) 05/10/2020   ALT 13 05/10/2020    Lab Results  Component Value Date   CHOL 144 05/10/2020   HDL 37.80 (L) 05/10/2020   LDLCALC 75 05/10/2020   TRIG 156.0 (H) 05/10/2020   CHOLHDL 4 05/10/2020    --------------------------------------------------------------------------------------------------  ASSESSMENT AND PLAN: Coronary artery disease: No symptoms to suggest worsening coronary insufficiency.  Continue current medications for secondary prevention.  Christopher Oconnell does not wish to rechallenge with higher intensity statin therapy or add an additional agent such as ezetimibe.  Lifestyle modifications were encouraged.  Hypertension: Blood pressure well controlled today.  No medication changes at this time.  Hyperlipidemia associated with type 2 diabetes mellitus: As above, we will continue with atorvastatin 40 mg daily and work on lifestyle modifications to help  improve LDL and triglycerides.  Ongoing management of diabetes per Dr. Einar Pheasant.  Follow-up: Return to clinic in 1 year.  Nelva Bush, MD 08/17/2020 9:13 AM

## 2020-10-07 ENCOUNTER — Encounter: Payer: Self-pay | Admitting: Ophthalmology

## 2020-10-19 ENCOUNTER — Other Ambulatory Visit: Payer: Self-pay

## 2020-10-19 ENCOUNTER — Ambulatory Visit: Payer: PPO | Admitting: Dermatology

## 2020-10-19 DIAGNOSIS — L821 Other seborrheic keratosis: Secondary | ICD-10-CM | POA: Diagnosis not present

## 2020-10-19 DIAGNOSIS — D2261 Melanocytic nevi of right upper limb, including shoulder: Secondary | ICD-10-CM | POA: Diagnosis not present

## 2020-10-19 DIAGNOSIS — L820 Inflamed seborrheic keratosis: Secondary | ICD-10-CM | POA: Diagnosis not present

## 2020-10-19 DIAGNOSIS — Z85828 Personal history of other malignant neoplasm of skin: Secondary | ICD-10-CM

## 2020-10-19 DIAGNOSIS — D229 Melanocytic nevi, unspecified: Secondary | ICD-10-CM

## 2020-10-19 DIAGNOSIS — D1800 Hemangioma unspecified site: Secondary | ICD-10-CM | POA: Diagnosis not present

## 2020-10-19 DIAGNOSIS — Z1283 Encounter for screening for malignant neoplasm of skin: Secondary | ICD-10-CM

## 2020-10-19 DIAGNOSIS — L578 Other skin changes due to chronic exposure to nonionizing radiation: Secondary | ICD-10-CM | POA: Diagnosis not present

## 2020-10-19 DIAGNOSIS — L918 Other hypertrophic disorders of the skin: Secondary | ICD-10-CM

## 2020-10-19 DIAGNOSIS — L814 Other melanin hyperpigmentation: Secondary | ICD-10-CM

## 2020-10-19 DIAGNOSIS — L570 Actinic keratosis: Secondary | ICD-10-CM

## 2020-10-19 DIAGNOSIS — D485 Neoplasm of uncertain behavior of skin: Secondary | ICD-10-CM

## 2020-10-19 HISTORY — DX: Melanocytic nevi, unspecified: D22.9

## 2020-10-19 NOTE — Progress Notes (Signed)
Follow-Up Visit   Subjective  Christopher Oconnell is a 70 y.o. male who presents for the following: Annual Exam (Hx BCC, AK's ). The patient presents for Total-Body Skin Exam (TBSE) for skin cancer screening and mole check.  The following portions of the chart were reviewed this encounter and updated as appropriate:   Tobacco  Allergies  Meds  Problems  Med Hx  Surg Hx  Fam Hx     Review of Systems:  No other skin or systemic complaints except as noted in HPI or Assessment and Plan.  Objective  Well appearing patient in no apparent distress; mood and affect are within normal limits.  A full examination was performed including scalp, head, eyes, ears, nose, lips, neck, chest, axillae, abdomen, back, buttocks, bilateral upper extremities, bilateral lower extremities, hands, feet, fingers, toes, fingernails, and toenails. All findings within normal limits unless otherwise noted below.   Assessment & Plan  AK (actinic keratosis) (16) L temple x 3, L ear and preauricular area x 5, nose x 1, R temple x 1, R ear x 5, scalp x 1  Recheck L temple at follow up. If not resolved plan biopsy.   Destruction of lesion - L temple x 3, L ear and preauricular area x 5, nose x 1, R temple x 1, R ear x 5, scalp x 1 Complexity: simple   Destruction method: cryotherapy   Informed consent: discussed and consent obtained   Timeout:  patient name, date of birth, surgical site, and procedure verified Lesion destroyed using liquid nitrogen: Yes   Region frozen until ice ball extended beyond lesion: Yes   Outcome: patient tolerated procedure well with no complications   Post-procedure details: wound care instructions given    Inflamed seborrheic keratosis R arm x 1, R hand x 1, L arm and hand x 3  Destruction of lesion - R arm x 1, R hand x 1, L arm and hand x 3 Complexity: simple   Destruction method: cryotherapy   Informed consent: discussed and consent obtained   Timeout:  patient name, date  of birth, surgical site, and procedure verified Lesion destroyed using liquid nitrogen: Yes   Region frozen until ice ball extended beyond lesion: Yes   Outcome: patient tolerated procedure well with no complications   Post-procedure details: wound care instructions given    Neoplasm of uncertain behavior of skin R med bicep  Epidermal / dermal shaving  Lesion diameter (cm):  0.6 Informed consent: discussed and consent obtained   Timeout: patient name, date of birth, surgical site, and procedure verified   Procedure prep:  Patient was prepped and draped in usual sterile fashion Prep type:  Isopropyl alcohol Anesthesia: the lesion was anesthetized in a standard fashion   Anesthetic:  1% lidocaine w/ epinephrine 1-100,000 buffered w/ 8.4% NaHCO3 Instrument used: flexible razor blade   Hemostasis achieved with: pressure, aluminum chloride and electrodesiccation   Outcome: patient tolerated procedure well   Post-procedure details: sterile dressing applied and wound care instructions given   Dressing type: bandage and petrolatum    Specimen 1 - Surgical pathology Differential Diagnosis: D48.5 r/o dysplastic nevus vs hemangioma Check Margins: No  Skin cancer screening  Lentigines - Scattered tan macules - Due to sun exposure - Benign-appering, observe - Recommend daily broad spectrum sunscreen SPF 30+ to sun-exposed areas, reapply every 2 hours as needed. - Call for any changes  Seborrheic Keratoses - Stuck-on, waxy, tan-brown papules and/or plaques  - Benign-appearing - Discussed benign  etiology and prognosis. - Observe - Call for any changes  Melanocytic Nevi - Tan-brown and/or pink-flesh-colored symmetric macules and papules - Benign appearing on exam today - Observation - Call clinic for new or changing moles - Recommend daily use of broad spectrum spf 30+ sunscreen to sun-exposed areas.   Hemangiomas - Red papules - Discussed benign nature - Observe - Call for  any changes  Actinic Damage - Chronic condition, secondary to cumulative UV/sun exposure - diffuse scaly erythematous macules with underlying dyspigmentation - Recommend daily broad spectrum sunscreen SPF 30+ to sun-exposed areas, reapply every 2 hours as needed.  - Staying in the shade or wearing long sleeves, sun glasses (UVA+UVB protection) and wide brim hats (4-inch brim around the entire circumference of the hat) are also recommended for sun protection.  - Call for new or changing lesions.  Acrochordons (Skin Tags) - Fleshy, skin-colored pedunculated papules - Benign appearing.  - Observe. - If desired, they can be removed with an in office procedure that is not covered by insurance. - Please call the clinic if you notice any new or changing lesions.  Skin cancer screening performed today.  Return in about 3 months (around 01/19/2021) for AK recheck .  [name], CMA, am acting as scribe for Sarina Ser, MD .  Documentation: I have reviewed the above documentation for accuracy and completeness, and I agree with the above.  Sarina Ser, MD

## 2020-10-19 NOTE — Patient Instructions (Signed)

## 2020-10-25 ENCOUNTER — Encounter: Payer: Self-pay | Admitting: Dermatology

## 2020-10-26 ENCOUNTER — Telehealth: Payer: Self-pay

## 2020-10-26 NOTE — Telephone Encounter (Signed)
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

## 2020-10-26 NOTE — Telephone Encounter (Signed)
Advised pt of bx results and scheduled pt for surgery./sh 

## 2020-11-06 ENCOUNTER — Other Ambulatory Visit: Payer: Self-pay | Admitting: Internal Medicine

## 2020-11-08 ENCOUNTER — Ambulatory Visit: Payer: PPO | Admitting: Dermatology

## 2020-11-08 ENCOUNTER — Telehealth: Payer: Self-pay

## 2020-11-08 ENCOUNTER — Other Ambulatory Visit: Payer: Self-pay

## 2020-11-08 ENCOUNTER — Encounter: Payer: Self-pay | Admitting: Dermatology

## 2020-11-08 DIAGNOSIS — D2261 Melanocytic nevi of right upper limb, including shoulder: Secondary | ICD-10-CM

## 2020-11-08 DIAGNOSIS — D239 Other benign neoplasm of skin, unspecified: Secondary | ICD-10-CM | POA: Insufficient documentation

## 2020-11-08 DIAGNOSIS — D2262 Melanocytic nevi of left upper limb, including shoulder: Secondary | ICD-10-CM

## 2020-11-08 DIAGNOSIS — L988 Other specified disorders of the skin and subcutaneous tissue: Secondary | ICD-10-CM | POA: Diagnosis not present

## 2020-11-08 DIAGNOSIS — D492 Neoplasm of unspecified behavior of bone, soft tissue, and skin: Secondary | ICD-10-CM

## 2020-11-08 DIAGNOSIS — D485 Neoplasm of uncertain behavior of skin: Secondary | ICD-10-CM

## 2020-11-08 HISTORY — DX: Other benign neoplasm of skin, unspecified: D23.9

## 2020-11-08 MED ORDER — MUPIROCIN 2 % EX OINT: 1.0000 "application " | TOPICAL_OINTMENT | Freq: Every day | CUTANEOUS | 0 refills | Status: DC

## 2020-11-08 NOTE — Progress Notes (Signed)
Follow-Up Visit   Subjective  Christopher Oconnell is a 70 y.o. male who presents for the following: Procedure (Biopsy proven mod-severe dysplastic nevus of right med bicep - excise today) and Nevus (L bicep, yrs).  The following portions of the chart were reviewed this encounter and updated as appropriate:   Tobacco  Allergies  Meds  Problems  Med Hx  Surg Hx  Fam Hx     Review of Systems:  No other skin or systemic complaints except as noted in HPI or Assessment and Plan.  Objective  Well appearing patient in no apparent distress; mood and affect are within normal limits.  A focused examination was performed including right arm. Relevant physical exam findings are noted in the Assessment and Plan.  Right med bicep Healing biopsy site 0.9cm  L med bicep at junction of L deltoid 0.6cm brown macule   Assessment & Plan  Neoplasm of uncertain behavior of skin Right med bicep  Skin excision  Lesion length (cm):  0.9 Lesion width (cm):  0.9 Margin per side (cm):  0.2 Total excision diameter (cm):  1.3 Informed consent: discussed and consent obtained   Timeout: patient name, date of birth, surgical site, and procedure verified   Procedure prep:  Patient was prepped and draped in usual sterile fashion Prep type:  Isopropyl alcohol and povidone-iodine Anesthesia: the lesion was anesthetized in a standard fashion   Anesthetic:  1% lidocaine w/ epinephrine 1-100,000 buffered w/ 8.4% NaHCO3 (Lido 3.5cc, Bupivicain x 2cc, Total 5.5cc) Instrument used: #15 blade   Hemostasis achieved with: pressure   Hemostasis achieved with comment:  Electrocautery Outcome: patient tolerated procedure well with no complications   Post-procedure details: sterile dressing applied and wound care instructions given   Dressing type: bandage and pressure dressing (Mupirocin)    Skin repair Complexity:  Complex Final length (cm):  3 Reason for type of repair: reduce tension to allow closure,  reduce the risk of dehiscence, infection, and necrosis, reduce subcutaneous dead space and avoid a hematoma, allow closure of the large defect, preserve normal anatomy, preserve normal anatomical and functional relationships and enhance both functionality and cosmetic results   Undermining: area extensively undermined   Undermining comment:  Undermining Defect 1.3cm Subcutaneous layers (deep stitches):  Suture size:  4-0 Suture type: Vicryl (polyglactin 910)   Subcutaneous suture technique: Inverted Dermal. Fine/surface layer approximation (top stitches):  Suture size:  4-0 Suture type: nylon   Stitches: simple running   Suture removal (days):  7 Hemostasis achieved with: pressure Outcome: patient tolerated procedure well with no complications   Post-procedure details: sterile dressing applied and wound care instructions given   Dressing type: bandage, pressure dressing and bacitracin (Mupirocin)    mupirocin ointment (BACTROBAN) 2 % Apply 1 application topically daily. With dressing changes  Specimen 1 - Surgical pathology Differential Diagnosis: Biopsy proven mod-severe dysplastic nevus  Check Margins: Yes [id number]  Bx proven mod to severe dysplastic nevus, excised today Start Mupirocin oint qd to excision site  Neoplasm of skin L med bicep at junction of L deltoid  Epidermal / dermal shaving  Lesion diameter (cm):  0.6 Informed consent: discussed and consent obtained   Timeout: patient name, date of birth, surgical site, and procedure verified   Procedure prep:  Patient was prepped and draped in usual sterile fashion Prep type:  Isopropyl alcohol Anesthesia: the lesion was anesthetized in a standard fashion   Anesthetic:  1% lidocaine w/ epinephrine 1-100,000 buffered w/ 8.4% NaHCO3 Instrument used:  flexible razor blade   Hemostasis achieved with: pressure, aluminum chloride and electrodesiccation   Outcome: patient tolerated procedure well   Post-procedure details:  sterile dressing applied and wound care instructions given   Dressing type: bandage and bacitracin    Specimen 2 - Surgical pathology Differential Diagnosis: d48.5 Nevus vs Dysplastic nevus  Check Margins: yes 0.6cm brown macule  Return in about 1 week (around 11/15/2020) for suture removal.  I, Othelia Pulling, RMA, am acting as scribe for Sarina Ser, MD . Documentation: I have reviewed the above documentation for accuracy and completeness, and I agree with the above.  Sarina Ser, MD

## 2020-11-08 NOTE — Patient Instructions (Signed)
Wound Care Instructions  Cleanse wound gently with soap and water once a day then pat dry with clean gauze. Apply a thing coat of Petrolatum (petroleum jelly, "Vaseline") over the wound (unless you have an allergy to this). We recommend that you use a new, sterile tube of Vaseline. Do not pick or remove scabs. Do not remove the yellow or white "healing tissue" from the base of the wound.  Cover the wound with fresh, clean, nonstick gauze and secure with paper tape. You may use Band-Aids in place of gauze and tape if the would is small enough, but would recommend trimming much of the tape off as there is often too much. Sometimes Band-Aids can irritate the skin.  You should call the office for your biopsy report after 1 week if you have not already been contacted.  If you experience any problems, such as abnormal amounts of bleeding, swelling, significant bruising, significant pain, or evidence of infection, please call the office immediately.  FOR ADULT SURGERY PATIENTS: If you need something for pain relief you may take 1 extra strength Tylenol (acetaminophen) AND 2 Ibuprofen (200mg each) together every 4 hours as needed for pain. (do not take these if you are allergic to them or if you have a reason you should not take them.) Typically, you may only need pain medication for 1 to 3 days.   If you have any questions or concerns for your doctor, please call our main line at [phone] and press option 4 to reach your doctor's medical assistant. If no one answers, please leave a voicemail as directed and we will return your call as soon as possible. Messages left after 4 pm will be answered the following business day.   You may also send us a message via MyChart. We typically respond to MyChart messages within 1-2 business days.  For prescription refills, please ask your pharmacy to contact our office. Our fax number is [phone].  If you have an urgent issue when the clinic is closed that  cannot wait until the next business day, you can page your doctor at the number below.    Please note that while we do our best to be available for urgent issues outside of office hours, we are not available 24/7.   If you have an urgent issue and are unable to reach us, you may choose to seek medical care at your doctor's office, retail clinic, urgent care center, or emergency room.  If you have a medical emergency, please immediately call 911 or go to the emergency department.  Pager Numbers  - Dr. Kowalski: [phone]  - Dr. Moye: [phone]  - Dr. Stewart: [phone]  In the event of inclement weather, please call our main line at [phone] for an update on the status of any delays or closures.  Dermatology Medication Tips: Please keep the boxes that topical medications come in in order to help keep track of the instructions about where and how to use these. Pharmacies typically print the medication instructions only on the boxes and not directly on the medication tubes.   If your medication is too expensive, please contact our office at [phone] option 4 or send us a message through MyChart.   We are unable to tell what your co-pay for medications will be in advance as this is different depending on your insurance coverage. However, we may be able to find a substitute medication at lower cost or fill out paperwork to get insurance to cover a needed   medication.   If a prior authorization is required to get your medication covered by your insurance company, please allow us 1-2 business days to complete this process.  Drug prices often vary depending on where the prescription is filled and some pharmacies may offer cheaper prices.  The website www.goodrx.com contains coupons for medications through different pharmacies. The prices here do not account for what the cost may be with help from insurance (it may be cheaper with your insurance), but the website can give you the  price if you did not use any insurance.  - You can print the associated coupon and take it with your prescription to the pharmacy.  - You may also stop by our office during regular business hours and pick up a GoodRx coupon card.  - If you need your prescription sent electronically to a different pharmacy, notify our office through [facility] MyChart or by phone at [phone] option 4.   

## 2020-11-08 NOTE — Telephone Encounter (Signed)
Patient doing fine after yesterday's surgery.

## 2020-11-10 ENCOUNTER — Other Ambulatory Visit: Payer: Self-pay

## 2020-11-10 ENCOUNTER — Ambulatory Visit (INDEPENDENT_AMBULATORY_CARE_PROVIDER_SITE_OTHER): Payer: PPO | Admitting: Family Medicine

## 2020-11-10 VITALS — BP 134/75 | HR 73 | Temp 98.0°F | Ht 70.0 in | Wt 212.5 lb

## 2020-11-10 DIAGNOSIS — E785 Hyperlipidemia, unspecified: Secondary | ICD-10-CM | POA: Diagnosis not present

## 2020-11-10 DIAGNOSIS — E1149 Type 2 diabetes mellitus with other diabetic neurological complication: Secondary | ICD-10-CM | POA: Diagnosis not present

## 2020-11-10 DIAGNOSIS — I10 Essential (primary) hypertension: Secondary | ICD-10-CM | POA: Diagnosis not present

## 2020-11-10 LAB — POCT GLYCOSYLATED HEMOGLOBIN (HGB A1C): Hemoglobin A1C: 7.1 % — AB (ref 4.0–5.6)

## 2020-11-10 NOTE — Assessment & Plan Note (Signed)
BP controlled. Cont amlodipine 10 mg and enalapril 20 mg and maxzide 37.5-25 mg.

## 2020-11-10 NOTE — Assessment & Plan Note (Addendum)
Lab Results  Component Value Date   HGBA1C 7.1 (A) 11/10/2020   Slightly worse but still reasonable control given age. Encouraged getting back to exercise and diet. Cont metformin 500 mg bid. Foot exam with neuropathy - discussed regular foot checks and control of diabetes.

## 2020-11-10 NOTE — Progress Notes (Signed)
Subjective:     Christopher Oconnell is a 70 y.o. male presenting for Follow-up (DM/Declined flu shot)     HPI  #Diabetes Currently taking metformin (Glucophage, [place])  Using medications without difficulties: Yes Hypoglycemic episodes:Yes  Hyperglycemic episodes:No  Feet problems:No  Blood Sugars averaging: 130-140 Last HgbA1c:  Lab Results  Component Value Date   HGBA1C 7.1 (A) 11/10/2020    Diabetes Health Maintenance Due:    Diabetes Health Maintenance Due  Topic Date Due   FOOT EXAM  07/29/2020   HEMOGLOBIN A1C  05/10/2021   OPHTHALMOLOGY EXAM  07/06/2021    Diet: off and on  Exercise: nothing due to weather, more active in the fall    Review of Systems  Eyes:  Negative for visual disturbance.  Cardiovascular:  Negative for chest pain.  Neurological:  Negative for headaches.    Social History   Tobacco Use  Smoking Status Former   Packs/day: 3.50   Years: 30.00   Pack years: 105.00   Types: Cigarettes   Quit date: 1993   Years since quitting: 29.6  Smokeless Tobacco Never        Objective:    BP Readings from Last 3 Encounters:  11/10/20 134/75  08/17/20 124/70  05/10/20 126/60   Wt Readings from Last 3 Encounters:  11/10/20 212 lb 8 oz (96.4 kg)  08/17/20 210 lb (95.3 kg)  05/10/20 208 lb (94.3 kg)    BP 134/75   Pulse 73   Temp 98 F (36.7 C) (Temporal)   Ht '5\' 10"'$  (1.778 m)   Wt 212 lb 8 oz (96.4 kg)   SpO2 95%   BMI 30.49 kg/m    Physical Exam Constitutional:      Appearance: Normal appearance. He is not ill-appearing or diaphoretic.  HENT:     Right Ear: External ear normal.     Left Ear: External ear normal.  Eyes:     General: No scleral icterus.    Extraocular Movements: Extraocular movements intact.     Conjunctiva/sclera: Conjunctivae normal.  Cardiovascular:     Rate and Rhythm: Normal rate and regular rhythm.  Pulmonary:     Effort: Pulmonary effort is normal. No respiratory distress.     Breath sounds:  Normal breath sounds. No wheezing.  Musculoskeletal:     Cervical back: Neck supple.  Skin:    General: Skin is warm and dry.  Neurological:     Mental Status: He is alert. Mental status is at baseline.  Psychiatric:        Mood and Affect: Mood normal.        Assessment & Plan:   Problem List Items Addressed This Visit       Cardiovascular and Mediastinum   Essential hypertension    BP controlled. Cont amlodipine 10 mg and enalapril 20 mg and maxzide 37.5-25 mg.         Endocrine   Type 2 diabetes mellitus with neurological complications ([place]) - Primary    Lab Results  Component Value Date   HGBA1C 7.1 (A) 11/10/2020  Slightly worse but still reasonable control given age. Encouraged getting back to exercise and diet. Cont metformin 500 mg bid. Foot exam with neuropathy - discussed regular foot checks and control of diabetes.       Relevant Orders   POCT glycosylated hemoglobin (Hb A1C) (Completed)     Other   Hyperlipidemia LDL goal <70    Lab Results  Component Value Date   LDLCALC  75 05/10/2020  Close to goal of <70. Continue atorvastatin 40 mg. If worse, anticipate increase with next check        Return in about 6 months (around 05/10/2021) for wellness visit.  Lesleigh Noe, MD  This visit occurred during the SARS-CoV-2 public health emergency.  Safety protocols were in place, including screening questions prior to the visit, additional usage of staff PPE, and extensive cleaning of exam room while observing appropriate contact time as indicated for disinfecting solutions.

## 2020-11-10 NOTE — Patient Instructions (Signed)
Diabetes - continued medications - work on diet and exercise - 6 month follow-up  - if glucose starts to trend around 160-170 then make an earlier follow-up visit

## 2020-11-10 NOTE — Assessment & Plan Note (Signed)
Lab Results  Component Value Date   LDLCALC 75 05/10/2020   Close to goal of <70. Continue atorvastatin 40 mg. If worse, anticipate increase with next check

## 2020-11-11 ENCOUNTER — Telehealth: Payer: Self-pay

## 2020-11-11 NOTE — Telephone Encounter (Signed)
-----   Message from Ralene Bathe, MD sent at 11/10/2020  7:30 PM EDT ----- Diagnosis 1. Skin , right med bicep NO RESIDUAL DYSPLASTIC NEVUS, MARGINS FREE 2. Skin , left med bicep at junction of L deltoid DYSPLASTIC JUNCTIONAL NEVUS WITH MODERATE ATYPIA, CLOSE TO MARGIN  1- site of severe dysplastic nevus Margins free 2- Moderate dysplastic Recheck next visit

## 2020-11-11 NOTE — Telephone Encounter (Signed)
Left message on voicemail to return my call.  

## 2020-11-15 ENCOUNTER — Other Ambulatory Visit: Payer: Self-pay

## 2020-11-15 ENCOUNTER — Ambulatory Visit (INDEPENDENT_AMBULATORY_CARE_PROVIDER_SITE_OTHER): Payer: PPO

## 2020-11-15 DIAGNOSIS — Z87898 Personal history of other specified conditions: Secondary | ICD-10-CM

## 2020-11-15 DIAGNOSIS — Z4802 Encounter for removal of sutures: Secondary | ICD-10-CM

## 2020-11-15 NOTE — Progress Notes (Signed)
   Follow-Up Visit   Subjective  Christopher Oconnell is a 70 y.o. male who presents for the following: Suture / Staple Removal (Suture removal for excision of biopsy proven severe dysplastic nevus. Surgical pathology showed no residual dysplastic nevus, margins free. ).   The following portions of the chart were reviewed this encounter and updated as appropriate:       Objective  Well appearing patient in no apparent distress; mood and affect are within normal limits.   Right Upper Arm - Anterior 7 day suture removal for excision of biopsy proven severe dysplastic nevus.  Incision site is clean, dry and intact   Assessment & Plan  History of atypical nevus Right Upper Arm - Anterior  Encounter for Removal of Sutures - Incision site at the right medial bicep is clean, dry and intact - Wound cleansed, sutures removed, wound cleansed and steri strips applied.  - Discussed pathology results showing no residual dysplastic nevus, margins free.   - Patient advised to keep steri-strips dry until they fall off. - Scars remodel for a full year. - Once steri-strips fall off, patient can apply over-the-counter silicone scar cream each night to help with scar remodeling if desired. - Patient advised to call with any concerns or if they notice any new or changing lesions.   Return in about 2 months (around 01/30/2021) for as scheduled.  I, Harriett Sine, CMA, am acting as scribe for [employer].

## 2020-11-15 NOTE — Patient Instructions (Signed)

## 2020-11-15 NOTE — Telephone Encounter (Signed)
Pt notified of results at suture removal.

## 2020-11-28 ENCOUNTER — Other Ambulatory Visit: Payer: Self-pay | Admitting: Family Medicine

## 2020-11-28 MED ORDER — ENALAPRIL MALEATE 20 MG PO TABS: 20.0000 mg | ORAL_TABLET | Freq: Every day | ORAL | 1 refills | Status: DC

## 2020-11-28 MED ORDER — FREESTYLE LITE TEST VI STRP: ORAL_STRIP | 2 refills | Status: DC

## 2020-11-28 NOTE — Telephone Encounter (Signed)
  Encourage patient to contact the pharmacy for refills or they can request refills through [place]:  Please schedule appointment if longer than 1 year  NEXT APPOINTMENT DATE:  MEDICATION:enalapril (VASOTEC) 20 MG tablet FREESTYLE LITE test strip   Is the patient out of medication?   PHARMACY:[id number] [place], [place] - [address]   Let patient know to contact pharmacy at the end of the day to make sure medication is ready.  Please notify patient to allow 48-72 hours to process  CLINICAL FILLS OUT ALL BELOW:   LAST REFILL:  QTY:  REFILL DATE:    OTHER COMMENTS:    Okay for refill?  Please advise

## 2020-11-28 NOTE — Telephone Encounter (Signed)
Enalapril last filled 12/13/18 by historical provider  Last OV: 11/10/20

## 2020-12-02 ENCOUNTER — Telehealth: Payer: Self-pay

## 2020-12-02 NOTE — Telephone Encounter (Signed)
-----   Message from Ralene Bathe, MD sent at 11/10/2020  7:30 PM EDT ----- Diagnosis 1. Skin , right med bicep NO RESIDUAL DYSPLASTIC NEVUS, MARGINS FREE 2. Skin , left med bicep at junction of L deltoid DYSPLASTIC JUNCTIONAL NEVUS WITH MODERATE ATYPIA, CLOSE TO MARGIN  1- site of severe dysplastic nevus Margins free 2- Moderate dysplastic Recheck next visit

## 2020-12-02 NOTE — Telephone Encounter (Signed)
Advised pt of bx results/sh ?

## 2021-01-13 ENCOUNTER — Other Ambulatory Visit: Payer: Self-pay | Admitting: Family Medicine

## 2021-01-25 ENCOUNTER — Telehealth (INDEPENDENT_AMBULATORY_CARE_PROVIDER_SITE_OTHER): Payer: PPO | Admitting: Nurse Practitioner

## 2021-01-25 ENCOUNTER — Other Ambulatory Visit: Payer: Self-pay

## 2021-01-25 VITALS — BP 124/69 | HR 112 | Temp 98.2°F

## 2021-01-25 DIAGNOSIS — U071 COVID-19: Secondary | ICD-10-CM | POA: Diagnosis not present

## 2021-01-25 MED ORDER — MOLNUPIRAVIR EUA 200MG CAPSULE: 4.0000 | ORAL_CAPSULE | Freq: Two times a day (BID) | ORAL | 0 refills | Status: AC

## 2021-01-25 NOTE — Assessment & Plan Note (Signed)
Discussed treatment options with patient decided to use molnupiravir as he is on a calcium channel blocker and a statin medication.  Discussed medication is still EUA only.  Patient acknowledged and wanted to continue with treatment discussed signs and symptoms as when to seek urgent and emergent health care.  Also discussed common side effects of the antiviral medication. Start molnupiravir soon as possible.

## 2021-01-25 NOTE — Progress Notes (Signed)
Patient ID: Christopher Oconnell, male    DOB: 09-11-1950, 70 y.o.   MRN: [id number]  Virtual visit completed through [place], a video enabled telemedicine application. Due to national recommendations of social distancing due to COVID-19, a virtual visit is felt to be most appropriate for this patient at this time. Reviewed limitations, risks, security and privacy concerns of performing a virtual visit and the availability of in person appointments. I also reviewed that there may be a patient responsible charge related to this service. The patient agreed to proceed.   Patient location: home Provider location: [place] at [facility], office Persons participating in this virtual visit: patient, provider   If any vitals were documented, they were collected by patient at home unless specified below.    BP 124/69 Comment: per patient  Pulse (!) 112   Temp 98.2 F (36.8 C) Comment: was 99.1-99.3 earlier today   CC: Covid 19 Subjective:   HPI: Christopher Oconnell is a 70 y.o. male presenting on 01/25/2021 for Covid Positive (Today 01/25/21, sx started on 01/24/21-tickle in the throat, cough, head congestion, headaches, body ache.)  Symptoms started on 01/24/2021 Tested positive for covid on 01/25/2021 Pfizer x2 and 1 booster No sick contacts Has taken ibuprofen and aleve helped with headache Alka seltzer cold and flu..       Relevant past medical, surgical, family and social history reviewed and updated as indicated. Interim medical history since our last visit reviewed. Allergies and medications reviewed and updated. Outpatient Medications Prior to Visit  Medication Sig Dispense Refill   amLODipine (NORVASC) 10 MG tablet TAKE 1 TABLET BY MOUTH EVERY DAY 90 tablet 2   aspirin EC 81 MG tablet Take 81 mg by mouth daily.     atorvastatin (LIPITOR) 40 MG tablet Take 1 tablet (40 mg total) by mouth daily. 90 tablet 3   CINNAMON PO Take 200 mg by mouth 2 (two) times daily.      enalapril (VASOTEC) 20 MG tablet Take 1 tablet (20 mg total) by mouth daily. 90 tablet 1   FREESTYLE LITE test strip Use to check blood glucose up to 3 times daily. 100 each 2   metFORMIN (GLUCOPHAGE) 500 MG tablet TAKE 1 TABLET BY MOUTH 2 TIMES DAILY WITH A MEAL. 180 tablet 1   Multiple Vitamin (MULTIVITAMIN) tablet Take 1 tablet by mouth daily.     mupirocin ointment (BACTROBAN) 2 % Apply 1 application topically daily. With dressing changes 22 g 0   Omega-3 Fatty Acids (FISH OIL PO) Take 1,200 mg by mouth. Takes 1 tablet qd.     Potassium Gluconate 550 MG TABS Take by mouth daily as needed.     triamterene-hydrochlorothiazide (MAXZIDE-25) 37.5-25 MG tablet Take 1 tablet by mouth every morning.     Turmeric (QC TUMERIC COMPLEX PO) Take by mouth daily.     No facility-administered medications prior to visit.     Per HPI unless specifically indicated in ROS section below Review of Systems  Constitutional:  Positive for fatigue. Negative for chills and fever.  HENT:  Positive for congestion and sinus pressure. Negative for ear discharge, ear pain, postnasal drip, sinus pain and sore throat.   Respiratory:  Positive for cough. Negative for shortness of breath.   Cardiovascular:  Negative for chest pain.  Gastrointestinal:  Positive for constipation. Negative for abdominal pain, diarrhea, nausea and vomiting.  Musculoskeletal:  Negative for arthralgias and myalgias.  Neurological:  Positive for headaches.  Objective:  BP 124/69 Comment:  per patient  Pulse (!) 112   Temp 98.2 F (36.8 C) Comment: was 99.1-99.3 earlier today  Wt Readings from Last 3 Encounters:  11/10/20 212 lb 8 oz (96.4 kg)  08/17/20 210 lb (95.3 kg)  05/10/20 208 lb (94.3 kg)       Physical exam: Gen: alert, NAD, not ill appearing Pulm: speaks in complete sentences without increased work of breathing Psych: normal mood, normal thought content      Results for orders placed or performed in visit on 11/10/20  POCT  glycosylated hemoglobin (Hb A1C)  Result Value Ref Range   Hemoglobin A1C 7.1 (A) 4.0 - 5.6 %   HbA1c POC (<> result, manual entry)     HbA1c, POC (prediabetic range)     HbA1c, POC (controlled diabetic range)     Assessment & Plan:   Problem List Items Addressed This Visit       Other   COVID-19 - Primary    Discussed treatment options with patient decided to use molnupiravir as he is on a calcium channel blocker and a statin medication.  Discussed medication is still EUA only.  Patient acknowledged and wanted to continue with treatment discussed signs and symptoms as when to seek urgent and emergent health care.  Also discussed common side effects of the antiviral medication. Start molnupiravir soon as possible.      Relevant Medications   molnupiravir EUA (LAGEVRIO) 200 mg CAPS capsule     No orders of the defined types were placed in this encounter.  No orders of the defined types were placed in this encounter.   I discussed the assessment and treatment plan with the patient. The patient was provided an opportunity to ask questions and all were answered. The patient agreed with the plan and demonstrated an understanding of the instructions. The patient was advised to call back or seek an in-person evaluation if the symptoms worsen or if the condition fails to improve as anticipated.  Follow up plan: No follow-ups on file.  Romilda Garret, NP

## 2021-01-30 ENCOUNTER — Ambulatory Visit: Payer: PPO | Admitting: Dermatology

## 2021-02-20 ENCOUNTER — Other Ambulatory Visit: Payer: Self-pay | Admitting: Family Medicine

## 2021-02-27 ENCOUNTER — Other Ambulatory Visit: Payer: Self-pay

## 2021-02-27 ENCOUNTER — Ambulatory Visit: Payer: PPO | Admitting: Dermatology

## 2021-02-27 DIAGNOSIS — L821 Other seborrheic keratosis: Secondary | ICD-10-CM

## 2021-02-27 DIAGNOSIS — L570 Actinic keratosis: Secondary | ICD-10-CM | POA: Diagnosis not present

## 2021-02-27 DIAGNOSIS — L578 Other skin changes due to chronic exposure to nonionizing radiation: Secondary | ICD-10-CM

## 2021-02-27 DIAGNOSIS — L820 Inflamed seborrheic keratosis: Secondary | ICD-10-CM | POA: Diagnosis not present

## 2021-02-27 NOTE — Patient Instructions (Signed)
Actinic keratoses are precancerous spots that appear secondary to cumulative UV radiation exposure/sun exposure over time. They are chronic with expected duration over 1 year. A portion of actinic keratoses will progress to squamous cell carcinoma of the skin. It is not possible to reliably predict which spots will progress to skin cancer and so treatment is recommended to prevent development of skin cancer.  Recommend daily broad spectrum sunscreen SPF 30+ to sun-exposed areas, reapply every 2 hours as needed.  Recommend staying in the shade or wearing long sleeves, sun glasses (UVA+UVB protection) and wide brim hats (4-inch brim around the entire circumference of the hat). Call for new or changing lesions.   Cryotherapy Aftercare  Wash gently with soap and water everyday.   Apply Vaseline and Band-Aid daily until healed.   If You Need Anything After Your Visit  If you have any questions or concerns for your doctor, please call our main line at [phone] and press option 4 to reach your doctor's medical assistant. If no one answers, please leave a voicemail as directed and we will return your call as soon as possible. Messages left after 4 pm will be answered the following business day.   You may also send [place] a message via [place]. We typically respond to MyChart messages within 1-2 business days.  For prescription refills, please ask your pharmacy to contact our office. Our fax number is [phone].  If you have an urgent issue when the clinic is closed that cannot wait until the next business day, you can page your doctor at the number below.    Please note that while we do our best to be available for urgent issues outside of office hours, we are not available 24/7.   If you have an urgent issue and are unable to reach [place], you may choose to seek medical care at your doctor's office, retail clinic, urgent care center, or emergency room.  If you have a medical emergency, please  immediately call 911 or go to the emergency department.  Pager Numbers  - Dr. Nehemiah Massed: [phone]  - Dr. Laurence Ferrari: [phone]  - Dr. Nicole Kindred: [phone]  In the event of inclement weather, please call our main line at [phone] for an update on the status of any delays or closures.  Dermatology Medication Tips: Please keep the boxes that topical medications come in in order to help keep track of the instructions about where and how to use these. Pharmacies typically print the medication instructions only on the boxes and not directly on the medication tubes.   If your medication is too expensive, please contact our office at [phone] option 4 or send [place] a message through [place].   We are unable to tell what your co-pay for medications will be in advance as this is different depending on your insurance coverage. However, we may be able to find a substitute medication at lower cost or fill out paperwork to get insurance to cover a needed medication.   If a prior authorization is required to get your medication covered by your insurance company, please allow [place] 1-2 business days to complete this process.  Drug prices often vary depending on where the prescription is filled and some pharmacies may offer cheaper prices.  The website www.goodrx.com contains coupons for medications through different pharmacies. The prices here do not account for what the cost may be with help from insurance (it may be cheaper with your insurance), but the website can give you the price if you  did not use any insurance.  - You can print the associated coupon and take it with your prescription to the pharmacy.  - You may also stop by our office during regular business hours and pick up a GoodRx coupon card.  - If you need your prescription sent electronically to a different pharmacy, notify our office through [facility] or by phone at [phone] option 4.     Si Usted Necesita Algo Despus  de Su Visita  Tambin puede enviarnos un mensaje a travs de Pharmacist, community. Por lo general respondemos a los mensajes de MyChart en el transcurso de 1 a 2 das hbiles.  Para renovar recetas, por favor pida a su farmacia que se ponga en contacto con nuestra oficina. Harland Dingwall de fax es [place] [phone].  Si tiene un asunto urgente cuando la clnica est cerrada y que no puede esperar hasta el siguiente da hbil, puede llamar/localizar a su doctor(a) al nmero que aparece a continuacin.   Por favor, tenga en cuenta que aunque hacemos todo lo posible para estar disponibles para asuntos urgentes fuera del horario de [place], no estamos disponibles las 24 horas del da, los 7 das de la [place].   Si tiene un problema urgente y no puede comunicarse con nosotros, puede optar por buscar atencin mdica  en el consultorio de su doctor(a), en una clnica privada, en un centro de atencin urgente o en una sala de emergencias.  Si tiene Engineering geologist, por favor llame inmediatamente al 911 o vaya a la sala de emergencias.  Nmeros de bper  - Dr. Nehemiah Massed: [phone]  - Dra. Moye: [phone]  - Dra. Nicole Kindred: [phone]  En caso de inclemencias del [place], por favor llame a Johnsie Kindred principal al [phone] para una actualizacin sobre el [place] de cualquier retraso o cierre.  Consejos para la medicacin en dermatologa: Por favor, guarde las cajas en las que vienen los medicamentos de uso tpico para ayudarle a seguir las instrucciones sobre dnde y cmo usarlos. Las farmacias generalmente imprimen las instrucciones del medicamento slo en las cajas y no directamente en los tubos del [place].   Si su medicamento es muy caro, por favor, pngase en contacto con Zigmund Daniel llamando al [phone] y presione la opcin 4 o envenos un mensaje a travs de Pharmacist, community.   No podemos decirle cul ser su copago por los medicamentos por adelantado ya que esto es diferente dependiendo  de la cobertura de su seguro. Sin embargo, es posible que podamos encontrar un medicamento sustituto a Electrical engineer un formulario para que el seguro cubra el medicamento que se considera necesario.   Si se requiere una autorizacin previa para que su compaa de seguros [place] su medicamento, por favor permtanos de 1 a 2 das hbiles para completar este proceso.  Los precios de los medicamentos varan con frecuencia dependiendo del Environmental consultant de dnde se surte la receta y alguna farmacias pueden ofrecer precios ms baratos.  El sitio web www.goodrx.com tiene cupones para medicamentos de Airline pilot. Los precios aqu no tienen en cuenta lo que podra costar con la ayuda del seguro (puede ser ms barato con su seguro), pero el sitio web puede darle el precio si no utiliz Research scientist (physical sciences).  - Puede imprimir el cupn correspondiente y llevarlo con su receta a la farmacia.  - Tambin puede pasar por nuestra oficina durante el horario de atencin regular y Charity fundraiser una tarjeta de cupones de GoodRx.  - Si necesita que su receta se  enve electrnicamente a una farmacia diferente, informe a nuestra oficina a travs de MyChart de [place] o por telfono llamando al [phone] y presione la opcin 4.

## 2021-02-27 NOTE — Progress Notes (Signed)
Follow-Up Visit   Subjective  Christopher Oconnell is a 70 y.o. male who presents for the following: Follow-up (Patient here today for 3 month ak follow up. ). The patient has spots, moles and lesions to be evaluated, some may be new or changing and the patient has concerns that these could be cancer.  The following portions of the chart were reviewed this encounter and updated as appropriate:  Tobacco   Allergies   Meds   Problems   Med Hx   Surg Hx   Fam Hx      Review of Systems: No other skin or systemic complaints except as noted in HPI or Assessment and Plan.  Objective  Well appearing patient in no apparent distress; mood and affect are within normal limits.  A focused examination was performed including arms, hands, face, scalp. Relevant physical exam findings are noted in the Assessment and Plan.  right nose x 1 Erythematous thin papules/macules with gritty scale.   arms and hands x 9 (9) Erythematous stuck-on, waxy papule or plaque   Assessment & Plan  Actinic keratosis right nose x 1 Actinic keratoses are precancerous spots that appear secondary to cumulative UV radiation exposure/sun exposure over time. They are chronic with expected duration over 1 year. A portion of actinic keratoses will progress to squamous cell carcinoma of the skin. It is not possible to reliably predict which spots will progress to skin cancer and so treatment is recommended to prevent development of skin cancer.  Recommend daily broad spectrum sunscreen SPF 30+ to sun-exposed areas, reapply every 2 hours as needed.  Recommend staying in the shade or wearing long sleeves, sun glasses (UVA+UVB protection) and wide brim hats (4-inch brim around the entire circumference of the hat). Call for new or changing lesions.  Destruction of lesion - right nose x 1 Complexity: simple   Destruction method: cryotherapy   Informed consent: discussed and consent obtained   Timeout:  patient name, date of birth,  surgical site, and procedure verified Lesion destroyed using liquid nitrogen: Yes   Region frozen until ice ball extended beyond lesion: Yes   Outcome: patient tolerated procedure well with no complications   Post-procedure details: wound care instructions given   Additional details:  Prior to procedure, discussed risks of blister formation, small wound, skin dyspigmentation, or rare scar following cryotherapy. Recommend Vaseline ointment to treated areas while healing.  Inflamed seborrheic keratosis (9) arms and hands x 9 Destruction of lesion - arms and hands x 9 Complexity: simple   Destruction method: cryotherapy   Informed consent: discussed and consent obtained   Timeout:  patient name, date of birth, surgical site, and procedure verified Lesion destroyed using liquid nitrogen: Yes   Region frozen until ice ball extended beyond lesion: Yes   Outcome: patient tolerated procedure well with no complications   Post-procedure details: wound care instructions given   Additional details:  Prior to procedure, discussed risks of blister formation, small wound, skin dyspigmentation, or rare scar following cryotherapy. Recommend Vaseline ointment to treated areas while healing.  Seborrheic Keratoses - Stuck-on, waxy, tan-brown papules and/or plaques  - Benign-appearing - Discussed benign etiology and prognosis. - Observe - Call for any changes  Actinic Damage - chronic, secondary to cumulative UV radiation exposure/sun exposure over time - diffuse scaly erythematous macules with underlying dyspigmentation - Recommend daily broad spectrum sunscreen SPF 30+ to sun-exposed areas, reapply every 2 hours as needed.  - Recommend staying in the shade or wearing long  sleeves, sun glasses (UVA+UVB protection) and wide brim hats (4-inch brim around the entire circumference of the hat). - Call for new or changing lesions.  Return for 6 month tbse . IRuthell Rummage, CMA, am acting as scribe for  Sarina Ser, MD. Documentation: I have reviewed the above documentation for accuracy and completeness, and I agree with the above.  Sarina Ser, MD

## 2021-03-02 ENCOUNTER — Encounter: Payer: Self-pay | Admitting: Dermatology

## 2021-03-23 ENCOUNTER — Other Ambulatory Visit: Payer: Self-pay | Admitting: Family Medicine

## 2021-04-24 ENCOUNTER — Telehealth: Payer: Self-pay | Admitting: Family Medicine

## 2021-04-24 ENCOUNTER — Other Ambulatory Visit: Payer: Self-pay | Admitting: Family Medicine

## 2021-04-24 NOTE — Telephone Encounter (Signed)
Spoke to pt and notified him of refills available at his pharmacy.

## 2021-04-24 NOTE — Telephone Encounter (Signed)
°  Encourage patient to contact the pharmacy for refills or they can request refills through [place]:  Please schedule appointment if longer than 1 year  NEXT APPOINTMENT DATE: 05/10/21  MEDICATION: atorvastatin (LIPITOR) 40 MG tablet  Is the patient out of medication? yes  PHARMACY: [facility]  Let patient know to contact pharmacy at the end of the day to make sure medication is ready.  Please notify patient to allow 48-72 hours to process  CLINICAL FILLS OUT ALL BELOW:   LAST REFILL:  QTY:  REFILL DATE:    OTHER COMMENTS:    Okay for refill?  Please advise

## 2021-04-25 ENCOUNTER — Other Ambulatory Visit: Payer: Self-pay

## 2021-04-25 ENCOUNTER — Other Ambulatory Visit: Payer: Self-pay | Admitting: Family Medicine

## 2021-04-25 DIAGNOSIS — E785 Hyperlipidemia, unspecified: Secondary | ICD-10-CM

## 2021-04-25 MED ORDER — ATORVASTATIN CALCIUM 40 MG PO TABS: 40.0000 mg | ORAL_TABLET | Freq: Every day | ORAL | 0 refills | Status: DC

## 2021-04-26 ENCOUNTER — Other Ambulatory Visit: Payer: Self-pay

## 2021-04-26 DIAGNOSIS — E785 Hyperlipidemia, unspecified: Secondary | ICD-10-CM

## 2021-04-26 MED ORDER — ATORVASTATIN CALCIUM 40 MG PO TABS: 40.0000 mg | ORAL_TABLET | Freq: Every day | ORAL | 0 refills | Status: DC

## 2021-04-26 NOTE — Progress Notes (Signed)
Subjective:   Christopher Oconnell is a 71 y.o. male who presents for Medicare Annual/Subsequent preventive examination.  I connected with Osborn Coho today by telephone and verified that I am speaking with the correct person using two identifiers. Location patient: home Location provider: work Persons participating in the virtual visit: patient, Marine scientist.    I discussed the limitations, risks, security and privacy concerns of performing an evaluation and management service by telephone and the availability of in person appointments. I also discussed with the patient that there may be a patient responsible charge related to this service. The patient expressed understanding and verbally consented to this telephonic visit.    Interactive audio and video telecommunications were attempted between this provider and patient, however failed, due to patient having technical difficulties OR patient did not have access to video capability.  We continued and completed visit with audio only.  Some vital signs may be absent or patient reported.   Time Spent with patient on telephone encounter: 25 minutes  Review of Systems     Cardiac Risk Factors include: advanced age (>58men, >38 women);diabetes mellitus;hypertension;dyslipidemia     Objective:    Today's Vitals   04/27/21 0815  Weight: 205 lb (93 kg)  Height: 5\' 10"  (1.778 m)   Body mass index is 29.41 kg/m.  Advanced Directives 04/27/2021 04/26/2020  Does Patient Have a Medical Advance Directive? Yes No  Type of Paramedic of [place];Living will -  Does patient want to make changes to medical advance directive? Yes (MAU/Ambulatory/Procedural Areas - Information given) No - Patient declined    Current Medications (verified) Outpatient Encounter Medications as of 04/27/2021  Medication Sig   amLODipine (NORVASC) 10 MG tablet TAKE 1 TABLET BY MOUTH EVERY DAY   aspirin EC 81 MG tablet Take 81 mg by mouth daily.    atorvastatin (LIPITOR) 40 MG tablet Take 1 tablet (40 mg total) by mouth daily.   enalapril (VASOTEC) 20 MG tablet Take 1 tablet (20 mg total) by mouth daily.   FREESTYLE LITE test strip USE TO CHECK BLOOD GLUCOSE UP TO 3 TIMES DAILY.   metFORMIN (GLUCOPHAGE) 500 MG tablet TAKE 1 TABLET BY MOUTH 2 TIMES DAILY WITH A MEAL.   Multiple Vitamin (MULTIVITAMIN) tablet Take 1 tablet by mouth daily.   Omega-3 Fatty Acids (FISH OIL PO) Take 1,200 mg by mouth. Takes 1 tablet qd.   Potassium Gluconate 550 MG TABS Take by mouth daily as needed.   triamterene-hydrochlorothiazide (MAXZIDE-25) 37.5-25 MG tablet Take 1 tablet by mouth every morning.   Turmeric (QC TUMERIC COMPLEX PO) Take by mouth daily.   CINNAMON PO Take 200 mg by mouth 2 (two) times daily. (Patient not taking: Reported on 04/27/2021)   [DISCONTINUED] atorvastatin (LIPITOR) 40 MG tablet Take 1 tablet (40 mg total) by mouth daily.   [DISCONTINUED] mupirocin ointment (BACTROBAN) 2 % Apply 1 application topically daily. With dressing changes (Patient not taking: Reported on 04/27/2021)   No facility-administered encounter medications on file as of 04/27/2021.    Allergies (verified) Patient has no known allergies.   History: Past Medical History:  Diagnosis Date   Arthritis    Asthma    Atypical mole 10/19/2020   R med bicep, mod to severe, exc 11/08/20   Basal cell carcinoma 11/05/2019   Right ear above crus. Nodular and infiltrative patterns. Excised 01/19/2020, margins free.   Coronary artery disease    Diabetes mellitus without complication ([place])    Dysplastic nevus 11/08/2020  left med bicep at junction of L deltoid - moderate   Dysplastic nevus 11/08/2020   left med bicep at junction of L deltoid, moderate atypia   GERD (gastroesophageal reflux disease)    Hyperlipidemia    Hypertension    Past Surgical History:  Procedure Laterality Date   CORONARY ANGIOPLASTY     No stents   NOSE SURGERY     SKIN CANCER EXCISION   01/2020   right ear    TONSILLECTOMY     Family History  Problem Relation Age of Onset   Dementia Mother    Coronary artery disease Father    Melanoma Father    Pancreatic cancer Father    Breast cancer Sister    Drug abuse Sister    Cancer Maternal Grandmother        unsure of the type   Heart attack Paternal Grandmother    Heart attack Paternal Grandfather    Social History   Socioeconomic History   Marital status: Married    Spouse name: [name]   Number of children: 0   Years of education: [employer] school   Highest education level: Not on file  Occupational History   Not on file  Tobacco Use   Smoking status: Former    Packs/day: 3.50    Years: 30.00    Pack years: 105.00    Types: Cigarettes    Quit date: 1993    Years since quitting: 30.1   Smokeless tobacco: Never  Vaping Use   Vaping Use: Never used  Substance and Sexual Activity   Alcohol use: Yes    Comment: 15 beers on the weekends   Drug use: Never   Sexual activity: Not Currently  Other Topics Concern   Not on file  Social History Narrative   07/30/19   From: the area   Living: with wife, Jackelyn Poling (1986, together since 1981)   Work: Retired, Dealer work in 2013      Family: sisters live nearby, and in-laws are in [place]      Enjoys: hunting, fishing, golf (occasionally)      Exercise: daily activities, yard work   Diet: meat - primarily venison, salads, fish, chicken - no red meat      Safety   Seat belts: Yes    Guns: Yes  and not currently secure   Safe in relationships: Yes    Social Determinants of Radio broadcast assistant Strain: Low Risk    Difficulty of Paying Living Expenses: Not hard at all  Food Insecurity: No Food Insecurity   Worried About Charity fundraiser in the Last Year: Never true   Arboriculturist in the Last Year: Never true  Transportation Needs: No Transportation Needs   Lack of Transportation (Medical): No   Lack of Transportation (Non-Medical): No   Physical Activity: Inactive   Days of Exercise per Week: 0 days   Minutes of Exercise per Session: 0 min  Stress: No Stress Concern Present   Feeling of Stress : Not at all  Social Connections: Moderately Isolated   Frequency of Communication with Friends and Family: More than three times a week   Frequency of Social Gatherings with Friends and Family: Three times a week   Attends Religious Services: Never   Active Member of Clubs or Organizations: No   Attends Archivist Meetings: Never   Marital Status: Married    Tobacco Counseling Counseling given: Not Answered   Clinical Intake:  Pre-visit preparation completed: Yes  Pain : No/denies pain     BMI - recorded: 30.42 Nutritional Status: BMI > 30  Obese Nutritional Risks: Unintentional weight loss (patient states having covid in November) Diabetes: Yes CBG done?: No Did pt. bring in CBG monitor from home?: No  How often do you need to have someone help you when you read instructions, pamphlets, or other written materials from your doctor or pharmacy?: 1 - Never  Diabetes:  Is the patient diabetic?  Yes  If diabetic, was a CBG obtained today?  No  Did the patient bring in their glucometer from home?  No  How often do you monitor your CBG's? Every other day , 2 times per day.   Financial Strains and Diabetes Management:  Are you having any financial strains with the device, your supplies or your medication? No .  Does the patient want to be seen by Chronic Care Management for management of their diabetes?  No  Would the patient like to be referred to a Nutritionist or for Diabetic Management?  No   Diabetic Exams:  Diabetic Eye Exam: Completed 07/06/20.   Diabetic Foot Exam: Pt has an upcoming appointment with PCP scheduled on 05/10/21  Interpreter Needed?: No  Information entered by :: Orrin Brigham LPN   Activities of Daily Living In your present state of health, do you have any difficulty  performing the following activities: 04/27/2021  Hearing? N  Vision? N  Difficulty concentrating or making decisions? N  Walking or climbing stairs? N  Dressing or bathing? N  Doing errands, shopping? N  Preparing Food and eating ? N  Using the Toilet? N  In the past six months, have you accidently leaked urine? N  Do you have problems with loss of bowel control? Y  Comment plans to discuss with PCP  Managing your Medications? N  Managing your Finances? N  Housekeeping or managing your Housekeeping? N  Some recent data might be hidden    Patient Care Team: Lesleigh Noe, MD as PCP - General (Family Medicine) End, Harrell Gave, MD as PCP - Cardiology (Cardiology)  Indicate any recent Medical Services you may have received from other than [facility] providers in the past year (date may be approximate).     Assessment:   This is a routine wellness examination for Zaul.  Hearing/Vision screen Hearing Screening - Comments:: No issues , has noticed a slight buzzing sound in both ears Vision Screening - Comments:: Last exam 07/06/20, wears glasses for reading and driving. Dr. Edison Pace    Dietary issues and exercise activities discussed: Current Exercise Habits: The patient does not participate in regular exercise at present   Goals Addressed             This Visit's Progress    Patient Stated       Would like to maintain current routine       Depression Screen PHQ 2/9 Scores 04/27/2021 04/26/2020 07/30/2019  PHQ - 2 Score 0 0 0  PHQ- 9 Score - 0 -    Fall Risk Fall Risk  04/27/2021 04/26/2020  Falls in the past year? 0 0  Number falls in past yr: 0 0  Injury with Fall? 0 0  Risk for fall due to : No Fall Risks Medication side effect  Follow up Falls prevention discussed Falls evaluation completed;Falls prevention discussed    FALL RISK PREVENTION PERTAINING TO THE HOME:  Any stairs in or around the home? Yes  If so,  are there any without handrails? No  Home free of loose  throw rugs in walkways, pet beds, electrical cords, etc? Yes  Adequate lighting in your home to reduce risk of falls? Yes   ASSISTIVE DEVICES UTILIZED TO PREVENT FALLS:  Life alert? No  Use of a cane, walker or w/c? No  Grab bars in the bathroom? Yes  Shower chair or bench in shower? No  Elevated toilet seat or a handicapped toilet? No   TIMED UP AND GO:  Was the test performed? No .    Cognitive Function: Normal cognitive status assessed by this Nurse Health Advisor. No abnormalities found.   MMSE - Mini Mental State Exam 04/26/2020  Orientation to time 5  Orientation to Place 5  Registration 3  Attention/ Calculation 5  Recall 3  Language- repeat 1        Immunizations Immunization History  Administered Date(s) Administered   Fluad Quad(high Dose 65+) 12/15/2018   Influenza-Unspecified 12/14/2019, 01/05/2021   PFIZER(Purple Top)SARS-COV-2 Vaccination 04/27/2019, 05/18/2019, 05/17/2020   Pneumococcal Conjugate-13 09/21/2014   Pneumococcal Polysaccharide-23 09/16/2012, 05/10/2020   Zoster Recombinat (Shingrix) 07/24/2018, 09/29/2018    TDAP status: Due, Education has been provided regarding the importance of this vaccine. Advised may receive this vaccine at local pharmacy or Health Dept. Aware to provide a copy of the vaccination record if obtained from local pharmacy or Health Dept. Verbalized acceptance and understanding.  Flu Vaccine status: Up to date  Pneumococcal vaccine status: Up to date  Covid-19 vaccine status: Information provided on how to obtain vaccines.   Qualifies for Shingles Vaccine? Yes   Zostavax completed No   Shingrix Completed?: Yes  Screening Tests Health Maintenance  Topic Date Due   COLONOSCOPY (Pts 45-20yrs Insurance coverage will need to be confirmed)  05/08/2020   COVID-19 Vaccine (4 - Booster for Pfizer series) 07/12/2020   FOOT EXAM  07/29/2020   TETANUS/TDAP  04/27/2023 (Originally 05/04/1969)   HEMOGLOBIN A1C  05/10/2021    OPHTHALMOLOGY EXAM  07/06/2021   Pneumonia Vaccine 43+ Years old  Completed   INFLUENZA VACCINE  Completed   Hepatitis C Screening  Completed   Zoster Vaccines- Shingrix  Completed   HPV VACCINES  Aged Out    Health Maintenance  Health Maintenance Due  Topic Date Due   COLONOSCOPY (Pts 45-63yrs Insurance coverage will need to be confirmed)  05/08/2020   COVID-19 Vaccine (4 - Booster for [place] series) 07/12/2020   FOOT EXAM  07/29/2020    Colorectal Cancer screening: Patient has an upcoming appointment scheduled 05/01/21  Lung Cancer Screening: (Low Dose CT Chest recommended if Age 71-80 years, 30 pack-year currently smoking OR have quit w/in 15years.) does qualify.     Additional Screening:  Hepatitis C Screening: does qualify; Completed 11/06/16  Vision Screening: Recommended annual ophthalmology exams for early detection of glaucoma and other disorders of the eye. Is the patient up to date with their annual eye exam?  Yes  Who is the provider or what is the name of the office in which the patient attends annual eye exams? Dr. Edison Pace    Dental Screening: Recommended annual dental exams for proper oral hygiene  Community Resource Referral / Chronic Care Management: CRR required this visit?  No   CCM required this visit?  No      Plan:     I have personally reviewed and noted the following in the patients chart:   Medical and social history Use of alcohol, tobacco or illicit drugs  Current medications and supplements including opioid prescriptions. Patient is not currently taking opioid prescriptions. Functional ability and status Nutritional status Physical activity Advanced directives List of other physicians Hospitalizations, surgeries, and ER visits in previous 12 months Vitals Screenings to include cognitive, depression, and falls Referrals and appointments  In addition, I have reviewed and discussed with patient certain preventive protocols, quality  metrics, and best practice recommendations. A written personalized care plan for preventive services as well as general preventive health recommendations were provided to patient.   Due to this being a telephonic visit, the after visit summary with patients personalized plan was offered to patient via mail or my-chart.  Patient would like to access on my-chart.    Loma Messing, LPN   3/00/9233   Nurse Health Advisor  Nurse Notes: none

## 2021-04-26 NOTE — Telephone Encounter (Signed)
Resent Rx refill to [facility] in [place]

## 2021-04-26 NOTE — Telephone Encounter (Signed)
Pt called in stated that the pharmacy called to say they did not get the orders to refill Rx atorvastatin (LIPITOR) 40 MG tablet . And he's all out of medication . Would like a call back to get this resolved . Please advise [phone]

## 2021-04-27 ENCOUNTER — Ambulatory Visit (INDEPENDENT_AMBULATORY_CARE_PROVIDER_SITE_OTHER): Payer: Medicare HMO

## 2021-04-27 VITALS — Ht 70.0 in | Wt 205.0 lb

## 2021-04-27 DIAGNOSIS — Z0000 Encounter for general adult medical examination without abnormal findings: Secondary | ICD-10-CM

## 2021-04-27 NOTE — Patient Instructions (Signed)
Christopher Oconnell , Thank you for taking time to complete your Medicare Wellness Visit. I appreciate your ongoing commitment to your health goals. Please review the following plan we discussed and let me know if I can assist you in the future.   Screening recommendations/referrals: Colonoscopy: due, last completed 05/09/15, per our conversation you have a colonoscopy scheduled on 05/01/21 Recommended yearly ophthalmology/optometry visit for glaucoma screening and checkup Recommended yearly dental visit for hygiene and checkup  Vaccinations: Influenza vaccine: up to date  Pneumococcal vaccine: up to date  Tdap vaccine: May obtain vaccine at your local pharmacy  Shingles vaccine: up to date    Covid-19: newest booster available at your local pharmacy   Advanced directives: Please bring a copy of Living Will and/or Irene for your chart.   Conditions/risks identified: see problem list   Next appointment: Follow up in one year for your annual wellness visit. 04/30/22 @ 8:15am, this will be a telephone visit  Preventive Care 72 Years and Older, Male Preventive care refers to lifestyle choices and visits with your health care provider that can promote health and wellness. What does preventive care include? A yearly physical exam. This is also called an annual well check. Dental exams once or twice a year. Routine eye exams. Ask your health care provider how often you should have your eyes checked. Personal lifestyle choices, including: Daily care of your teeth and gums. Regular physical activity. Eating a healthy diet. Avoiding tobacco and drug use. Limiting alcohol use. Practicing safe sex. Taking low doses of aspirin every day. Taking vitamin and mineral supplements as recommended by your health care provider. What happens during an annual well check? The services and screenings done by your health care provider during your annual well check will depend on your age,  overall health, lifestyle risk factors, and family history of disease. Counseling  Your health care provider may ask you questions about your: Alcohol use. Tobacco use. Drug use. Emotional well-being. Home and relationship well-being. Sexual activity. Eating habits. History of falls. Memory and ability to understand (cognition). Work and work Statistician. Screening  You may have the following tests or measurements: Height, weight, and BMI. Blood pressure. Lipid and cholesterol levels. These may be checked every 5 years, or more frequently if you are over 72 years old. Skin check. Lung cancer screening. You may have this screening every year starting at age 27 if you have a 30-pack-year history of smoking and currently smoke or have quit within the past 15 years. Fecal occult blood test (FOBT) of the stool. You may have this test every year starting at age 72. Flexible sigmoidoscopy or colonoscopy. You may have a sigmoidoscopy every 5 years or a colonoscopy every 10 years starting at age 72. Prostate cancer screening. Recommendations will vary depending on your family history and other risks. Hepatitis C blood test. Hepatitis B blood test. Sexually transmitted disease (STD) testing. Diabetes screening. This is done by checking your blood sugar (glucose) after you have not eaten for a while (fasting). You may have this done every 1-3 years. Abdominal aortic aneurysm (AAA) screening. You may need this if you are a current or former smoker. Osteoporosis. You may be screened starting at age 24 if you are at high risk. Talk with your health care provider about your test results, treatment options, and if necessary, the need for more tests. Vaccines  Your health care provider may recommend certain vaccines, such as: Influenza vaccine. This is recommended every year.  Tetanus, diphtheria, and acellular pertussis (Tdap, Td) vaccine. You may need a Td booster every 10 years. Zoster vaccine.  You may need this after age 39. Pneumococcal 13-valent conjugate (PCV13) vaccine. One dose is recommended after age 62. Pneumococcal polysaccharide (PPSV23) vaccine. One dose is recommended after age 80. Talk to your health care provider about which screenings and vaccines you need and how often you need them. This information is not intended to replace advice given to you by your health care provider. Make sure you discuss any questions you have with your health care provider. Document Released: 03/25/2015 Document Revised: 11/16/2015 Document Reviewed: 12/28/2014 Elsevier Interactive Patient Education  2017 [place] Prevention in the Home Falls can cause injuries. They can happen to people of all ages. There are many things you can do to make your home safe and to help prevent falls. What can I do on the outside of my home? Regularly fix the edges of walkways and driveways and fix any cracks. Remove anything that might make you trip as you walk through a door, such as a raised step or threshold. Trim any bushes or trees on the path to your home. Use bright outdoor lighting. Clear any walking paths of anything that might make someone trip, such as rocks or tools. Regularly check to see if handrails are loose or broken. Make sure that both sides of any steps have handrails. Any raised decks and porches should have guardrails on the edges. Have any leaves, snow, or ice cleared regularly. Use sand or salt on walking paths during winter. Clean up any spills in your garage right away. This includes oil or grease spills. What can I do in the bathroom? Use night lights. Install grab bars by the toilet and in the tub and shower. Do not use towel bars as grab bars. Use non-skid mats or decals in the tub or shower. If you need to sit down in the shower, use a plastic, non-slip stool. Keep the floor dry. Clean up any water that spills on the floor as soon as it happens. Remove soap  buildup in the tub or shower regularly. Attach bath mats securely with double-sided non-slip rug tape. Do not have throw rugs and other things on the floor that can make you trip. What can I do in the bedroom? Use night lights. Make sure that you have a light by your bed that is easy to reach. Do not use any sheets or blankets that are too big for your bed. They should not hang down onto the floor. Have a firm chair that has side arms. You can use this for support while you get dressed. Do not have throw rugs and other things on the floor that can make you trip. What can I do in the kitchen? Clean up any spills right away. Avoid walking on wet floors. Keep items that you use a lot in easy-to-reach places. If you need to reach something above you, use a strong step stool that has a grab bar. Keep electrical cords out of the way. Do not use floor polish or wax that makes floors slippery. If you must use wax, use non-skid floor wax. Do not have throw rugs and other things on the floor that can make you trip. What can I do with my stairs? Do not leave any items on the stairs. Make sure that there are handrails on both sides of the stairs and use them. Fix handrails that are broken or loose.  Make sure that handrails are as long as the stairways. Check any carpeting to make sure that it is firmly attached to the stairs. Fix any carpet that is loose or worn. Avoid having throw rugs at the top or bottom of the stairs. If you do have throw rugs, attach them to the floor with carpet tape. Make sure that you have a light switch at the top of the stairs and the bottom of the stairs. If you do not have them, ask someone to add them for you. What else can I do to help prevent falls? Wear shoes that: Do not have high heels. Have rubber bottoms. Are comfortable and fit you well. Are closed at the toe. Do not wear sandals. If you use a stepladder: Make sure that it is fully opened. Do not climb a closed  stepladder. Make sure that both sides of the stepladder are locked into place. Ask someone to hold it for you, if possible. Clearly mark and make sure that you can see: Any grab bars or handrails. First and last steps. Where the edge of each step is. Use tools that help you move around (mobility aids) if they are needed. These include: Canes. Walkers. Scooters. Crutches. Turn on the lights when you go into a dark area. Replace any light bulbs as soon as they burn out. Set up your furniture so you have a clear path. Avoid moving your furniture around. If any of your floors are uneven, fix them. If there are any pets around you, be aware of where they are. Review your medicines with your doctor. Some medicines can make you feel dizzy. This can increase your chance of falling. Ask your doctor what other things that you can do to help prevent falls. This information is not intended to replace advice given to you by your health care provider. Make sure you discuss any questions you have with your health care provider. Document Released: 12/23/2008 Document Revised: 08/04/2015 Document Reviewed: 04/02/2014 Elsevier Interactive Patient Education  2017 [employer].

## 2021-05-01 DIAGNOSIS — K5730 Diverticulosis of large intestine without perforation or abscess without bleeding: Secondary | ICD-10-CM | POA: Diagnosis not present

## 2021-05-01 DIAGNOSIS — D123 Benign neoplasm of transverse colon: Secondary | ICD-10-CM | POA: Diagnosis not present

## 2021-05-01 DIAGNOSIS — Z86010 Personal history of colonic polyps: Secondary | ICD-10-CM | POA: Diagnosis not present

## 2021-05-01 DIAGNOSIS — K649 Unspecified hemorrhoids: Secondary | ICD-10-CM | POA: Diagnosis not present

## 2021-05-01 LAB — HM COLONOSCOPY

## 2021-05-03 DIAGNOSIS — D123 Benign neoplasm of transverse colon: Secondary | ICD-10-CM | POA: Diagnosis not present

## 2021-05-10 ENCOUNTER — Encounter: Payer: Self-pay | Admitting: Family Medicine

## 2021-05-10 ENCOUNTER — Ambulatory Visit (INDEPENDENT_AMBULATORY_CARE_PROVIDER_SITE_OTHER): Payer: Medicare HMO | Admitting: Family Medicine

## 2021-05-10 ENCOUNTER — Other Ambulatory Visit: Payer: Self-pay

## 2021-05-10 VITALS — BP 130/70 | HR 72 | Temp 98.0°F | Ht 67.0 in | Wt 200.2 lb

## 2021-05-10 DIAGNOSIS — Z125 Encounter for screening for malignant neoplasm of prostate: Secondary | ICD-10-CM

## 2021-05-10 DIAGNOSIS — E785 Hyperlipidemia, unspecified: Secondary | ICD-10-CM | POA: Diagnosis not present

## 2021-05-10 DIAGNOSIS — Z0000 Encounter for general adult medical examination without abnormal findings: Secondary | ICD-10-CM | POA: Diagnosis not present

## 2021-05-10 DIAGNOSIS — E1169 Type 2 diabetes mellitus with other specified complication: Secondary | ICD-10-CM | POA: Diagnosis not present

## 2021-05-10 DIAGNOSIS — T753XXA Motion sickness, initial encounter: Secondary | ICD-10-CM

## 2021-05-10 DIAGNOSIS — L603 Nail dystrophy: Secondary | ICD-10-CM

## 2021-05-10 DIAGNOSIS — E1149 Type 2 diabetes mellitus with other diabetic neurological complication: Secondary | ICD-10-CM | POA: Diagnosis not present

## 2021-05-10 DIAGNOSIS — B079 Viral wart, unspecified: Secondary | ICD-10-CM | POA: Diagnosis not present

## 2021-05-10 DIAGNOSIS — I10 Essential (primary) hypertension: Secondary | ICD-10-CM | POA: Diagnosis not present

## 2021-05-10 LAB — COMPREHENSIVE METABOLIC PANEL
ALT: 15 U/L (ref 0–53)
AST: 16 U/L (ref 0–37)
Albumin: 4.8 g/dL (ref 3.5–5.2)
Alkaline Phosphatase: 60 U/L (ref 39–117)
BUN: 15 mg/dL (ref 6–23)
CO2: 31 mEq/L (ref 19–32)
Calcium: 10.2 mg/dL (ref 8.4–10.5)
Chloride: 101 mEq/L (ref 96–112)
Creatinine, Ser: 0.97 mg/dL (ref 0.40–1.50)
GFR: 78.26 mL/min (ref >60.00)
Glucose, Bld: 126 mg/dL — ABNORMAL HIGH (ref 70–99)
Potassium: 5 mEq/L (ref 3.5–5.1)
Sodium: 140 mEq/L (ref 135–145)
Total Bilirubin: 1 mg/dL (ref 0.2–1.2)
Total Protein: 7 g/dL (ref 6.0–8.3)

## 2021-05-10 LAB — LIPID PANEL
Cholesterol: 145 mg/dL (ref 0–200)
HDL: 41 mg/dL (ref >39.00)
LDL Cholesterol: 72 mg/dL (ref 0–99)
NonHDL: 103.76
Total CHOL/HDL Ratio: 4
Triglycerides: 158 mg/dL — ABNORMAL HIGH (ref 0.0–149.0)
VLDL: 31.6 mg/dL (ref 0.0–40.0)

## 2021-05-10 LAB — CBC WITH DIFFERENTIAL/PLATELET
Basophils Absolute: 0 10*3/uL (ref 0.0–0.1)
Basophils Relative: 0.5 % (ref 0.0–3.0)
Eosinophils Absolute: 0.2 10*3/uL (ref 0.0–0.7)
Eosinophils Relative: 2.1 % (ref 0.0–5.0)
HCT: 44.8 % (ref 39.0–52.0)
Hemoglobin: 15.1 g/dL (ref 13.0–17.0)
Lymphocytes Relative: 23.2 % (ref 12.0–46.0)
Lymphs Abs: 2.2 10*3/uL (ref 0.7–4.0)
MCHC: 33.8 g/dL (ref 30.0–36.0)
MCV: 95.9 fl (ref 78.0–100.0)
Monocytes Absolute: 0.9 10*3/uL (ref 0.1–1.0)
Monocytes Relative: 9.6 % (ref 3.0–12.0)
Neutro Abs: 6 10*3/uL (ref 1.4–7.7)
Neutrophils Relative %: 64.6 % (ref 43.0–77.0)
Platelets: 276 10*3/uL (ref 150.0–400.0)
RBC: 4.67 Mil/uL (ref 4.22–5.81)
RDW: 12.4 % (ref 11.5–15.5)
WBC: 9.3 10*3/uL (ref 4.0–10.5)

## 2021-05-10 LAB — HEMOGLOBIN A1C: Hgb A1c MFr Bld: 6.4 % (ref 4.6–6.5)

## 2021-05-10 LAB — PSA, MEDICARE: PSA: 0.54 ng/ml (ref 0.10–4.00)

## 2021-05-10 LAB — TSH: TSH: 1.1 u[IU]/mL (ref 0.35–5.50)

## 2021-05-10 MED ORDER — ONDANSETRON HCL 4 MG PO TABS: 4.0000 mg | ORAL_TABLET | Freq: Three times a day (TID) | ORAL | 0 refills | Status: DC | PRN

## 2021-05-10 MED ORDER — ENALAPRIL MALEATE 20 MG PO TABS: 20.0000 mg | ORAL_TABLET | Freq: Every day | ORAL | 3 refills | Status: DC

## 2021-05-10 MED ORDER — TRIAMTERENE-HCTZ 37.5-25 MG PO TABS
1.0000 | ORAL_TABLET | Freq: Every morning | ORAL | 3 refills | Status: DC
Start: 2021-05-10 — End: 2022-06-22

## 2021-05-10 MED ORDER — AMLODIPINE BESYLATE 10 MG PO TABS: 10.0000 mg | ORAL_TABLET | Freq: Every day | ORAL | 3 refills | Status: DC

## 2021-05-10 NOTE — Patient Instructions (Addendum)
Finger lesion ?- try Salicylic Acid ?- I think this may be a wart ? ? ?Continue working on [employer] and exercise ? ?Motion sickness ?- dramamine if riding in the car ?- try zofran - may make you sleepy ? ?

## 2021-05-10 NOTE — Assessment & Plan Note (Signed)
Recurrent raised lesion on his hand question for possible wart advised trial of salicylic acid. ?

## 2021-05-10 NOTE — Assessment & Plan Note (Signed)
Lab Results  ?Component Value Date  ? [employer] 74 05/10/2020  ?Continue atorvastatin 40 mg, repeat cholesterol today. ? ?

## 2021-05-10 NOTE — Progress Notes (Signed)
Annual Exam   Chief Complaint:  Chief Complaint  Patient presents with   Medicare Wellness    Part 2     History of Present Illness:  Christopher Oconnell is a 71 y.o. presents today for annual examination.    Covid - November Still having some chills and low body temperature  Nutrition/Lifestyle Diet: following diabetic diet Exercise: routine activity at the home He is not sexually active.  Any issues with getting or keeping erection? No  Social History   Tobacco Use  Smoking Status Former   Packs/day: 3.50   Years: 30.00   Pack years: 105.00   Types: Cigarettes   Quit date: 1993   Years since quitting: 30.1  Smokeless Tobacco Never   Social History   Substance and Sexual Activity  Alcohol Use Yes   Comment: 15 beers on the weekends   Social History   Substance and Sexual Activity  Drug Use Never     Safety The patient wears seatbelts: yes.     The patient feels safe at home and in their relationships: yes.  General Health Dentist in the last year: Yes Eye doctor: yes  Weight Wt Readings from Last 3 Encounters:  05/10/21 200 lb 3 oz (90.8 kg)  04/27/21 205 lb (93 kg)  11/10/20 212 lb 8 oz (96.4 kg)   Patient has high BMI  BMI Readings from Last 1 Encounters:  05/10/21 31.35 kg/m     Chronic disease screening Blood pressure monitoring:  BP Readings from Last 3 Encounters:  05/10/21 130/70  01/25/21 124/69  11/10/20 134/75    Lipid Monitoring: Indication for screening: age >35, obesity, diabetes, family hx, CV risk factors.  Lipid screening: Yes  Lab Results  Component Value Date   CHOL 144 05/10/2020   HDL 37.80 (L) 05/10/2020   LDLCALC 75 05/10/2020   TRIG 156.0 (H) 05/10/2020   CHOLHDL 4 05/10/2020     Diabetes Screening: age >65, overweight, family hx, PCOS, hx of gestational diabetes, at risk ethnicity, elevated blood pressure >135/80.  Diabetes Screening screening: Yes  Lab Results  Component Value Date   HGBA1C 7.1 (A)  11/10/2020     Prostate Cancer Screening: Yes Age 75-69 yo Shared Decision Making Higher Risk: Older age, African American, Family Hx of Prostate Cancer - Yes Benefits: screening may prevent 1.3 deaths from prostate cancer over 13 years per 1000 men screened and prevent 3 metastatic cases per 1000 men screened. Not enough evidence to support more benefit for AA or [place] Harms: False Positive and psychological harms. 15% of me with false positive over a 2 to 4 year period > resulting in biopsy and complications such as pain, hematospermia, infections. Overdiagnosis - increases with age - found that 20-50% of prostate cancer through screening may have never caused any issues. Harms of treatment include - erectile dysfunction, urinary incontinence, and bothersome bowel symptoms.   After discussion he does want to get a PSA checked today.   Inadequate evidence for screening <55 No mortality benefit for screening >70   Lab Results  Component Value Date   PSA 0.52 05/10/2020   PSA 0.57 05/22/2018       Colon Cancer Screening:  Age 41-75 yo - benefits outweigh the risk. Adults 16-85 yo who have never been screened benefit.  Benefits: 134000 people in 2016 will be diagnosed and 49,000 will die - early detection helps Harms: Complications 2/2 to colonoscopy High Risk (Colonoscopy): genetic disorder (Lynch syndrome or familial adenomatous polyposis), personal  hx of IBD, previous adenomatous polyp, or previous colorectal cancer, FamHx start 10 years before the age at diagnosis, increased in males and black race  Options:  FIT - looks for hemoglobin (blood in the stool) - specific and fairly sensitive - must be done annually Cologuard - looks for DNA and blood - more sensitive - therefore can have more false positives, every 3 years Colonoscopy - every 10 years if normal - sedation, bowl prep, must have someone drive you  Shared decision making and the patient had decided to do colonoscopy up to  date.   Social History   Tobacco Use  Smoking Status Former   Packs/day: 3.50   Years: 30.00   Pack years: 105.00   Types: Cigarettes   Quit date: 1993   Years since quitting: 30.1  Smokeless Tobacco Never    Lung Cancer Screening (Ages 70-80): yes 20 year pack history? Yes Current Tobacco user? No Quit less than 15 years ago? No Interested in low dose CT for lung cancer screening? not applicable  Abdominal Aortic Aneurysm:  Age 7-75, 1 time screening, men who have ever smoked - to expensive    Past Medical History:  Diagnosis Date   Arthritis    Asthma    Atypical mole 10/19/2020   R med bicep, mod to severe, exc 11/08/20   Basal cell carcinoma 11/05/2019   Right ear above crus. Nodular and infiltrative patterns. Excised 01/19/2020, margins free.   Coronary artery disease    Diabetes mellitus without complication ([place])    Dysplastic nevus 11/08/2020   left med bicep at junction of L deltoid - moderate   Dysplastic nevus 11/08/2020   left med bicep at junction of L deltoid, moderate atypia   GERD (gastroesophageal reflux disease)    Hyperlipidemia    Hypertension     Past Surgical History:  Procedure Laterality Date   CORONARY ANGIOPLASTY     No stents   NOSE SURGERY     SKIN CANCER EXCISION  01/2020   right ear    TONSILLECTOMY      Prior to Admission medications   Medication Sig Start Date End Date Taking? Authorizing Provider  amLODipine (NORVASC) 10 MG tablet TAKE 1 TABLET BY MOUTH EVERY DAY 11/07/20  Yes End, Harrell Gave, MD  aspirin EC 81 MG tablet Take 81 mg by mouth daily.   Yes [provider]  atorvastatin (LIPITOR) 40 MG tablet Take 1 tablet (40 mg total) by mouth daily. 04/26/21  Yes Lesleigh Noe, MD  enalapril (VASOTEC) 20 MG tablet Take 1 tablet (20 mg total) by mouth daily. 11/28/20  Yes Lesleigh Noe, MD  FREESTYLE LITE test strip USE TO CHECK BLOOD GLUCOSE UP TO 3 TIMES DAILY. 02/20/21  Yes Lesleigh Noe, MD  metFORMIN  (GLUCOPHAGE) 500 MG tablet TAKE 1 TABLET BY MOUTH 2 TIMES DAILY WITH A MEAL. 01/13/21  Yes Lesleigh Noe, MD  Multiple Vitamin (MULTIVITAMIN) tablet Take 1 tablet by mouth daily.   Yes [provider]  Omega-3 Fatty Acids (FISH OIL PO) Take 1,200 mg by mouth. Takes 1 tablet qd.   Yes [provider]  Potassium Gluconate 550 MG TABS Take by mouth daily as needed.   Yes [provider]  triamterene-hydrochlorothiazide (MAXZIDE-25) 37.5-25 MG tablet Take 1 tablet by mouth every morning. 01/26/19  Yes [provider]  Turmeric (QC TUMERIC COMPLEX PO) Take by mouth daily.   Yes [provider]  CINNAMON PO Take 200 mg by  mouth 2 (two) times daily. Patient not taking: Reported on 04/27/2021    [provider]    No Known Allergies   Social History   Socioeconomic History   Marital status: Married    Spouse name: [name]   Number of children: 0   Years of education: [employer] school   Highest education level: Not on file  Occupational History   Not on file  Tobacco Use   Smoking status: Former    Packs/day: 3.50    Years: 30.00    Pack years: 105.00    Types: Cigarettes    Quit date: 1993    Years since quitting: 30.1   Smokeless tobacco: Never  Vaping Use   Vaping Use: Never used  Substance and Sexual Activity   Alcohol use: Yes    Comment: 15 beers on the weekends   Drug use: Never   Sexual activity: Not Currently  Other Topics Concern   Not on file  Social History Narrative   07/30/19   From: the area   Living: with wife, Jackelyn Poling (1986, together since 1981)   Work: Retired, Dealer work in 2013      Family: sisters live nearby, and in-laws are in [place]      Enjoys: hunting, fishing, golf (occasionally)      Exercise: daily activities, yard work   Diet: meat - primarily venison, salads, fish, chicken - no red meat      Safety   Seat belts: Yes    Guns: Yes  and not currently secure   Safe in relationships: Yes     Social Determinants of Radio broadcast assistant Strain: Low Risk    Difficulty of Paying Living Expenses: Not hard at all  Food Insecurity: No Food Insecurity   Worried About Charity fundraiser in the Last Year: Never true   Arboriculturist in the Last Year: Never true  Transportation Needs: No Transportation Needs   Lack of Transportation (Medical): No   Lack of Transportation (Non-Medical): No  Physical Activity: Inactive   Days of Exercise per Week: 0 days   Minutes of Exercise per Session: 0 min  Stress: No Stress Concern Present   Feeling of Stress : Not at all  Social Connections: Moderately Isolated   Frequency of Communication with Friends and Family: More than three times a week   Frequency of Social Gatherings with Friends and Family: Three times a week   Attends Religious Services: Never   Active Member of Clubs or Organizations: No   Attends Music therapist: Never   Marital Status: Married  Human resources officer Violence: Not At Risk   Fear of Current or Ex-Partner: No   Emotionally Abused: No   Physically Abused: No   Sexually Abused: No    Family History  Problem Relation Age of Onset   Dementia Mother    Coronary artery disease Father    Melanoma Father    Pancreatic cancer Father    Breast cancer Sister    Drug abuse Sister    Cancer Maternal Grandmother        unsure of the type   Heart attack Paternal Grandmother    Heart attack Paternal Grandfather     Review of Systems  Constitutional:  Negative for chills and fever.  HENT:  Negative for congestion and sore throat.   Eyes:  Negative for blurred vision and double vision.  Respiratory:  Negative for shortness of breath.   Cardiovascular:  Negative for chest pain.  Gastrointestinal:  Negative for heartburn, nausea and vomiting.  Genitourinary: Negative.   Musculoskeletal: Negative.  Negative for myalgias.  Skin:  Negative for rash.  Neurological:  Negative for dizziness and  headaches.  Endo/Heme/Allergies:  Does not bruise/bleed easily.  Psychiatric/Behavioral:  Negative for depression. The patient is not nervous/anxious.     Physical Exam BP 130/70    Pulse 72    Temp 98 F (36.7 C) (Oral)    Ht 5\' 7"  (1.702 m)    Wt 200 lb 3 oz (90.8 kg)    SpO2 98%    BMI 31.35 kg/m    BP Readings from Last 3 Encounters:  05/10/21 130/70  01/25/21 124/69  11/10/20 134/75      Physical Exam Constitutional:      General: He is not in acute distress.    Appearance: He is well-developed. He is not diaphoretic.  HENT:     Head: Normocephalic and atraumatic.     Right Ear: Tympanic membrane and ear canal normal.     Left Ear: Tympanic membrane and ear canal normal.     Nose: Nose normal.     Mouth/Throat:     Pharynx: Uvula midline.  Eyes:     General: No scleral icterus.    Conjunctiva/sclera: Conjunctivae normal.     Pupils: Pupils are equal, round, and reactive to light.  Cardiovascular:     Rate and Rhythm: Normal rate and regular rhythm.     Heart sounds: Normal heart sounds. No murmur heard. Pulmonary:     Effort: Pulmonary effort is normal. No respiratory distress.     Breath sounds: Normal breath sounds. No wheezing.  Abdominal:     General: Bowel sounds are normal. There is no distension.     Palpations: Abdomen is soft. There is no mass.     Tenderness: There is no abdominal tenderness. There is no guarding.  Musculoskeletal:        General: Normal range of motion.     Cervical back: Normal range of motion and neck supple.  Lymphadenopathy:     Cervical: No cervical adenopathy.  Skin:    General: Skin is warm and dry.     Capillary Refill: Capillary refill takes less than 2 seconds.     Comments: Right index finger with rough raised lesion  Neurological:     Mental Status: He is alert and oriented to person, place, and time.       Results:  PHQ-9:  Flowsheet Row Clinical Support from 04/26/2020 in [facility]   PHQ-9 Total Score 0         Assessment: 71 y.o. here for routine annual physical examination.  Plan: Problem List Items Addressed This Visit       Cardiovascular and Mediastinum   Essential hypertension   Relevant Medications   enalapril (VASOTEC) 20 MG tablet   triamterene-hydrochlorothiazide (MAXZIDE-25) 37.5-25 MG tablet   amLODipine (NORVASC) 10 MG tablet   Other Relevant Orders   Comprehensive metabolic panel   CBC with Differential     Endocrine   Type 2 diabetes mellitus with neurological complications (HCC)    Continue metformin 500 twice daily.  He does have some neuropathy discussed importance of regular skin checks and proper foot care.  Continue statin and ACE.  Labs today      Relevant Medications   enalapril (VASOTEC) 20 MG tablet   Other Relevant Orders   Hemoglobin A1c   Hyperlipidemia  associated with type 2 diabetes mellitus [facility])    Lab Results  Component Value Date   LDLCALC 75 05/10/2020  Continue atorvastatin 40 mg, repeat cholesterol today.       Relevant Medications   enalapril (VASOTEC) 20 MG tablet   triamterene-hydrochlorothiazide (MAXZIDE-25) 37.5-25 MG tablet   amLODipine (NORVASC) 10 MG tablet   Other Relevant Orders   Lipid panel     Musculoskeletal and Integument   Wart of hand    Recurrent raised lesion on his hand question for possible wart advised trial of salicylic acid.      Other Visit Diagnoses     Annual physical exam    -  Primary   Relevant Orders   Comprehensive metabolic panel   Brittle nails       Relevant Orders   TSH   Screening for prostate cancer       Relevant Orders   PSA, Medicare   Motion sickness, initial encounter       Relevant Medications   ondansetron (ZOFRAN) 4 MG tablet       Screening: -- Blood pressure screen normal -- cholesterol screening: will obtain -- Weight screening: overweight: continue to monitor -- Diabetes Screening: will obtain -- Nutrition: normal - Encouraged  healthy diet  The 10-year ASCVD risk score (Arnett DK, et al., 2019) is: 40.6%   Values used to calculate the score:     Age: 14 years     Sex: Male     Is Non-Hispanic African American: No     Diabetic: Yes     Tobacco smoker: No     Systolic Blood Pressure: 790 mmHg     Is BP treated: Yes     HDL Cholesterol: 37.8 mg/dL     Total Cholesterol: 144 mg/dL  -- ASA 81 mg discussed if CVD risk >10% age 83-59 and willing to take for 10 years -- Statin therapy for Age 49-75 with CVD risk >7.5%  Psych -- Depression screening (PHQ-9): negative  Safety -- tobacco screening: not using -- alcohol screening: some heavy use on the weekends - not interested in reducing -- no evidence of domestic violence or intimate partner violence.  Cancer Screening -- Prostate (age 31-69) ordered -- Colon (age 8-75)  will get records, up to date -- Lung not indicated   Immunizations Immunization History  Administered Date(s) Administered   Fluad Quad(high Dose 65+) 12/15/2018   Influenza-Unspecified 12/14/2019, 01/05/2021   PFIZER(Purple Top)SARS-COV-2 Vaccination 04/27/2019, 05/18/2019, 05/17/2020   Pneumococcal Conjugate-13 09/21/2014   Pneumococcal Polysaccharide-23 09/16/2012, 05/10/2020   Zoster Recombinat (Shingrix) 07/24/2018, 09/29/2018    -- flu vaccine up to date -- TDAP q10 years up to date -- Shingles (age >78) up to date -- PPSV-23 (19-64 with chronic disease or smoking) up to date -- PCV-13 (age >75) - one dose followed by PPSV-23 1 year later up to date -- Covid-19 Vaccine up to date  Encouraged regular vision and dental screening. Encouraged healthy exercise and diet.   Lesleigh Noe

## 2021-05-10 NOTE — Assessment & Plan Note (Signed)
Continue metformin 500 twice daily.  He does have some neuropathy discussed importance of regular skin checks and proper foot care.  Continue statin and ACE.  Labs today ?

## 2021-05-11 ENCOUNTER — Encounter: Payer: Self-pay | Admitting: Family Medicine

## 2021-06-08 ENCOUNTER — Encounter: Payer: Self-pay | Admitting: Family Medicine

## 2021-07-06 DIAGNOSIS — E119 Type 2 diabetes mellitus without complications: Secondary | ICD-10-CM | POA: Diagnosis not present

## 2021-07-06 DIAGNOSIS — H2513 Age-related nuclear cataract, bilateral: Secondary | ICD-10-CM | POA: Diagnosis not present

## 2021-07-06 LAB — HM DIABETES EYE EXAM

## 2021-07-09 ENCOUNTER — Other Ambulatory Visit: Payer: Self-pay | Admitting: Family Medicine

## 2021-07-21 ENCOUNTER — Other Ambulatory Visit: Payer: Self-pay | Admitting: Family Medicine

## 2021-07-21 DIAGNOSIS — E785 Hyperlipidemia, unspecified: Secondary | ICD-10-CM

## 2021-08-18 DIAGNOSIS — H524 Presbyopia: Secondary | ICD-10-CM | POA: Diagnosis not present

## 2021-08-24 ENCOUNTER — Encounter: Payer: Self-pay | Admitting: Internal Medicine

## 2021-08-24 ENCOUNTER — Ambulatory Visit: Payer: Medicare HMO | Admitting: Internal Medicine

## 2021-08-24 VITALS — BP 108/62 | HR 62 | Ht 69.0 in | Wt 200.0 lb

## 2021-08-24 DIAGNOSIS — I2510 Atherosclerotic heart disease of native coronary artery without angina pectoris: Secondary | ICD-10-CM

## 2021-08-24 DIAGNOSIS — E785 Hyperlipidemia, unspecified: Secondary | ICD-10-CM

## 2021-08-24 DIAGNOSIS — E1169 Type 2 diabetes mellitus with other specified complication: Secondary | ICD-10-CM | POA: Diagnosis not present

## 2021-08-24 DIAGNOSIS — I10 Essential (primary) hypertension: Secondary | ICD-10-CM

## 2021-08-24 NOTE — Progress Notes (Signed)
Follow-up Outpatient Visit Date: 08/24/2021  Primary Care Provider: Lesleigh Noe, [place] [address]  Chief Complaint: Follow-up CAD  HPI:  Christopher Oconnell is a 71 y.o. male with history of coronary artery disease status post angioplasty to D1 in 1993, hypertension, hyperlipidemia, and type 2 diabetes mellitus, who presents for follow-up of coronary artery disease.  I last saw him a year ago, at which time he was feeling well.  He felt better in regard to his energy after decreasing atorvastatin to 40 mg daily.  We discussed rechallenging with higher intensity statin versus adding ezetimibe due to LDL above goal.  However, Christopher Oconnell wished to defer this.  Most recent labs in March of this year showed LDL of 72 and triglycerides 158.  He continues to feel well, denying chest pain, shortness of breath, palpitations, lightheadedness, and edema.  He is not exercising regularly but states that he stays very busy around his house.  His wife is in need of cataract and knee surgery, which will force him to take on even more activities.  --------------------------------------------------------------------------------------------------  Past Medical History:  Diagnosis Date   Arthritis    Asthma    Atypical mole 10/19/2020   R med bicep, mod to severe, exc 11/08/20   Basal cell carcinoma 11/05/2019   Right ear above crus. Nodular and infiltrative patterns. Excised 01/19/2020, margins free.   Coronary artery disease    Diabetes mellitus without complication ([place])    Dysplastic nevus 11/08/2020   left med bicep at junction of L deltoid - moderate   Dysplastic nevus 11/08/2020   left med bicep at junction of L deltoid, moderate atypia   GERD (gastroesophageal reflux disease)    Hyperlipidemia    Hypertension    Past Surgical History:  Procedure Laterality Date   CORONARY ANGIOPLASTY     No stents   NOSE SURGERY     SKIN CANCER EXCISION  01/2020   right ear     TONSILLECTOMY      Current Meds  Medication Sig   amLODipine (NORVASC) 10 MG tablet Take 1 tablet (10 mg total) by mouth daily.   aspirin EC 81 MG tablet Take 81 mg by mouth daily.   atorvastatin (LIPITOR) 40 MG tablet TAKE 1 TABLET BY MOUTH EVERY DAY   CINNAMON PO Take 200 mg by mouth 2 (two) times daily.   enalapril (VASOTEC) 20 MG tablet Take 1 tablet (20 mg total) by mouth daily.   FREESTYLE LITE test strip USE TO CHECK BLOOD GLUCOSE UP TO 3 TIMES DAILY.   metFORMIN (GLUCOPHAGE) 500 MG tablet TAKE 1 TABLET BY MOUTH 2 TIMES DAILY WITH A MEAL.   Multiple Vitamin (MULTIVITAMIN) tablet Take 1 tablet by mouth daily.   Omega-3 Fatty Acids (FISH OIL PO) Take 1,200 mg by mouth. Takes 1 tablet qd.   ondansetron (ZOFRAN) 4 MG tablet Take 1 tablet (4 mg total) by mouth every 8 (eight) hours as needed for nausea or vomiting.   Potassium Gluconate 550 MG TABS Take by mouth daily as needed.   triamterene-hydrochlorothiazide (MAXZIDE-25) 37.5-25 MG tablet Take 1 tablet by mouth every morning.   Turmeric (QC TUMERIC COMPLEX PO) Take by mouth daily.    Allergies: Patient has no known allergies.  Social History   Tobacco Use   Smoking status: Former    Packs/day: 3.50    Years: 30.00    Total pack years: 105.00    Types: Cigarettes    Quit  date: 53    Years since quitting: 30.4   Smokeless tobacco: Never  Vaping Use   Vaping Use: Never used  Substance Use Topics   Alcohol use: Yes    Comment: 15 beers on the weekends   Drug use: Never    Family History  Problem Relation Age of Onset   Dementia Mother    Coronary artery disease Father    Melanoma Father    Pancreatic cancer Father    Breast cancer Sister    Drug abuse Sister    Cancer Maternal Grandmother        unsure of the type   Heart attack Paternal Grandmother    Heart attack Paternal Grandfather     Review of Systems: A 12-system review of systems was performed and was negative except as noted in the  HPI.  --------------------------------------------------------------------------------------------------  Physical Exam: BP 108/62 (BP Location: Left Arm, Patient Position: Sitting, Cuff Size: Large)   Pulse 62   Ht '5\' 9"'$  (1.753 m)   Wt 200 lb (90.7 kg)   SpO2 98%   BMI 29.53 kg/m   General:  NAD. Neck: No JVD or HJR. Lungs: Clear to auscultation bilaterally without wheezes or crackles. Heart: Regular rate and rhythm without murmurs, rubs, or gallops. Abdomen: Soft, nontender, nondistended. Extremities: No lower extremity edema.  EKG: Normal sinus rhythm with sinus arrhythmia.  No significant abnormality.  No significant change from prior tracing on 08/17/2020.  Lab Results  Component Value Date   WBC 9.3 05/10/2021   HGB 15.1 05/10/2021   HCT 44.8 05/10/2021   MCV 95.9 05/10/2021   PLT 276.0 05/10/2021    Lab Results  Component Value Date   NA 140 05/10/2021   K 5.0 05/10/2021   CL 101 05/10/2021   CO2 31 05/10/2021   BUN 15 05/10/2021   CREATININE 0.97 05/10/2021   GLUCOSE 126 (H) 05/10/2021   ALT 15 05/10/2021    Lab Results  Component Value Date   CHOL 145 05/10/2021   HDL 41.00 05/10/2021   LDLCALC 72 05/10/2021   TRIG 158.0 (H) 05/10/2021   CHOLHDL 4 05/10/2021    --------------------------------------------------------------------------------------------------  ASSESSMENT AND PLAN: Coronary artery disease: Christopher Oconnell continues to do well without any recurrent angina.  Continue current medications for secondary prevention.  He does not wish to escalate lipid therapy at this time, though his LDL and triglycerides remain just above goal.  Hypertension: Blood pressure well controlled today.  No medication changes.  Hyperlipidemia associated with type 2 diabetes mellitus: LDL and triglycerides slightly above goal on last check by Dr. Einar Pheasant in March.  We again discussed escalation of statin therapy versus addition of adjunct of therapy such as  ezetimibe.  Christopher Oconnell does not wish to make any medication changes today given prior fatigue associated with atorvastatin 80 mg daily.  He also believes he may have had problems in the past with rosuvastatin, though I see no documentation of this medication having been prescribed in his chart.  I encouraged him to try to improve his lipids through lifestyle modifications, including diet and exercise.  Ongoing management of diabetes per Dr. Einar Pheasant.  Follow-up: Return to clinic in 1 year.  Nelva Bush, MD 08/24/2021 8:25 AM

## 2021-08-24 NOTE — Patient Instructions (Signed)
Medication Instructions:  ? ?Your physician recommends that you continue on your current medications as directed. Please refer to the Current Medication list given to you today. ? ?*If you need a refill on your cardiac medications before your next appointment, please call your pharmacy* ? ? ?Lab Work: ? ?None ordered ? ?Testing/Procedures: ? ?None ordered ? ? ?Follow-Up: ?At [facility], you and your health needs are our priority.  As part of our continuing mission to provide you with exceptional heart care, we have created designated Provider Care Teams.  These Care Teams include your primary Cardiologist (physician) and Advanced Practice Providers (APPs -  Physician Assistants and Nurse Practitioners) who all work together to provide you with the care you need, when you need it. ? ?We recommend signing up for the patient portal called "MyChart".  Sign up information is provided on this After Visit Summary.  MyChart is used to connect with patients for Virtual Visits (Telemedicine).  Patients are able to view lab/test results, encounter notes, upcoming appointments, etc.  Non-urgent messages can be sent to your provider as well.   ?To learn more about what you can do with MyChart, go to [email or web address].   ? ?Your next appointment:   ?1 year(s) ? ?The format for your next appointment:   ?In Person ? ?Provider:   ?You may see Christopher End, MD or one of the following Advanced Practice Providers on your designated Care Team:   ?Christopher Berge, NP ?Ryan Dunn, PA-C ?Cadence Furth, PA-C ? ?Important Information About Sugar ? ? ? ? ? ? ?

## 2021-08-30 ENCOUNTER — Encounter: Payer: Self-pay | Admitting: Family Medicine

## 2021-08-31 ENCOUNTER — Ambulatory Visit: Payer: Medicare HMO | Admitting: Dermatology

## 2021-08-31 DIAGNOSIS — L570 Actinic keratosis: Secondary | ICD-10-CM | POA: Diagnosis not present

## 2021-08-31 DIAGNOSIS — L578 Other skin changes due to chronic exposure to nonionizing radiation: Secondary | ICD-10-CM | POA: Diagnosis not present

## 2021-08-31 DIAGNOSIS — Z85828 Personal history of other malignant neoplasm of skin: Secondary | ICD-10-CM | POA: Diagnosis not present

## 2021-08-31 DIAGNOSIS — L820 Inflamed seborrheic keratosis: Secondary | ICD-10-CM | POA: Diagnosis not present

## 2021-08-31 DIAGNOSIS — L814 Other melanin hyperpigmentation: Secondary | ICD-10-CM | POA: Diagnosis not present

## 2021-08-31 DIAGNOSIS — D229 Melanocytic nevi, unspecified: Secondary | ICD-10-CM | POA: Diagnosis not present

## 2021-08-31 DIAGNOSIS — L821 Other seborrheic keratosis: Secondary | ICD-10-CM

## 2021-08-31 DIAGNOSIS — Z1283 Encounter for screening for malignant neoplasm of skin: Secondary | ICD-10-CM | POA: Diagnosis not present

## 2021-08-31 DIAGNOSIS — D1800 Hemangioma unspecified site: Secondary | ICD-10-CM

## 2021-08-31 DIAGNOSIS — B079 Viral wart, unspecified: Secondary | ICD-10-CM

## 2021-08-31 DIAGNOSIS — Z86018 Personal history of other benign neoplasm: Secondary | ICD-10-CM | POA: Diagnosis not present

## 2021-08-31 DIAGNOSIS — L918 Other hypertrophic disorders of the skin: Secondary | ICD-10-CM

## 2021-08-31 NOTE — Progress Notes (Unsigned)
   Follow-Up Visit   Subjective  Christopher Oconnell is a 71 y.o. male who presents for the following: No chief complaint on file..  The patient presents for Total-Body Skin Exam (TBSE) for skin cancer screening and mole check.  The patient has spots, moles and lesions to be evaluated, some may be new or changing and the patient has concerns that these could be cancer.   The following portions of the chart were reviewed this encounter and updated as appropriate:      Review of Systems: No other skin or systemic complaints except as noted in HPI or Assessment and Plan.   Objective  Well appearing patient in no apparent distress; mood and affect are within normal limits.  A full examination was performed including scalp, head, eyes, ears, nose, lips, neck, chest, axillae, abdomen, back, buttocks, bilateral upper extremities, bilateral lower extremities, hands, feet, fingers, toes, fingernails, and toenails. All findings within normal limits unless otherwise noted below.   Assessment & Plan  Lentigines - Scattered tan macules - Due to sun exposure - Benign-appearing, observe - Recommend daily broad spectrum sunscreen SPF 30+ to sun-exposed areas, reapply every 2 hours as needed. - Call for any changes  Seborrheic Keratoses - Stuck-on, waxy, tan-brown papules and/or plaques  - Benign-appearing - Discussed benign etiology and prognosis. - Observe - Call for any changes  Acrochordons (Skin Tags) - Fleshy, skin-colored pedunculated papules - Benign appearing.  - Observe. - If desired, they can be removed with an in office procedure that is not covered by insurance. - Please call the clinic if you notice any new or changing lesions.  Melanocytic Nevi - Tan-brown and/or pink-flesh-colored symmetric macules and papules - Benign appearing on exam today - Observation - Call clinic for new or changing moles - Recommend daily use of broad spectrum spf 30+ sunscreen to sun-exposed  areas.   Hemangiomas - Red papules - Discussed benign nature - Observe - Call for any changes  Actinic Damage - Chronic condition, secondary to cumulative UV/sun exposure - diffuse scaly erythematous macules with underlying dyspigmentation - Recommend daily broad spectrum sunscreen SPF 30+ to sun-exposed areas, reapply every 2 hours as needed.  - Staying in the shade or wearing long sleeves, sun glasses (UVA+UVB protection) and wide brim hats (4-inch brim around the entire circumference of the hat) are also recommended for sun protection.  - Call for new or changing lesions.  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  - Call if any new or changing lesions are noted between office visits  History of Basal Cell Carcinoma of the Skin right ear above crus 11/21 margins free  - No evidence of recurrence today - Recommend regular full body skin exams - Recommend daily broad spectrum sunscreen SPF 30+ to sun-exposed areas, reapply every 2 hours as needed.  - Call if any new or changing lesions are noted between office visits  Skin cancer screening performed today. No follow-ups on file. IRuthell Rummage, CMA, am acting as scribe for Sarina Ser, MD.

## 2021-08-31 NOTE — Patient Instructions (Addendum)
For callous at feet   Recommend seeing podiatrist for further evaluation if become bothersome     Actinic keratoses are precancerous spots that appear secondary to cumulative UV radiation exposure/sun exposure over time. They are chronic with expected duration over 1 year. A portion of actinic keratoses will progress to squamous cell carcinoma of the skin. It is not possible to reliably predict which spots will progress to skin cancer and so treatment is recommended to prevent development of skin cancer.  Recommend daily broad spectrum sunscreen SPF 30+ to sun-exposed areas, reapply every 2 hours as needed.  Recommend staying in the shade or wearing long sleeves, sun glasses (UVA+UVB protection) and wide brim hats (4-inch brim around the entire circumference of the hat). Call for new or changing lesions.   Cryotherapy Aftercare  Wash gently with soap and water everyday.   Apply Vaseline and Band-Aid daily until healed.     Seborrheic Keratosis  What causes seborrheic keratoses? Seborrheic keratoses are harmless, common skin growths that first appear during adult life.  As time goes by, more growths appear.  Some people may develop a large number of them.  Seborrheic keratoses appear on both covered and uncovered body parts.  They are not caused by sunlight.  The tendency to develop seborrheic keratoses can be inherited.  They vary in color from skin-colored to gray, brown, or even black.  They can be either smooth or have a rough, warty surface.   Seborrheic keratoses are superficial and look as if they were stuck on the skin.  Under the microscope this type of keratosis looks like layers upon layers of skin.  That is why at times the top layer may seem to fall off, but the rest of the growth remains and re-grows.    Treatment Seborrheic keratoses do not need to be treated, but can easily be removed in the office.  Seborrheic keratoses often cause symptoms when they rub on clothing or  jewelry.  Lesions can be in the way of shaving.  If they become inflamed, they can cause itching, soreness, or burning.  Removal of a seborrheic keratosis can be accomplished by freezing, burning, or surgery. If any spot bleeds, scabs, or grows rapidly, please return to have it checked, as these can be an indication of a skin cancer.    Melanoma ABCDEs  Melanoma is the most dangerous type of skin cancer, and is the leading cause of death from skin disease.  You are more likely to develop melanoma if you: Have light-colored skin, light-colored eyes, or red or blond hair Spend a lot of time in the sun Tan regularly, either outdoors or in a tanning bed Have had blistering sunburns, especially during childhood Have a close family member who has had a melanoma Have atypical moles or large birthmarks  Early detection of melanoma is key since treatment is typically straightforward and cure rates are extremely high if we catch it early.   The first sign of melanoma is often a change in a mole or a new dark spot.  The ABCDE system is a way of remembering the signs of melanoma.  A for asymmetry:  The two halves do not match. B for border:  The edges of the growth are irregular. C for color:  A mixture of colors are present instead of an even brown color. D for diameter:  Melanomas are usually (but not always) greater than 59m - the size of a pencil eraser. E for evolution:  The spot  keeps changing in size, shape, and color.  Please check your skin once per month between visits. You can use a small mirror in front and a large mirror behind you to keep an eye on the back side or your body.   If you see any new or changing lesions before your next follow-up, please call to schedule a visit.  Please continue daily skin protection including broad spectrum sunscreen SPF 30+ to sun-exposed areas, reapplying every 2 hours as needed when you're outdoors.   Staying in the shade or wearing long sleeves,  sun glasses (UVA+UVB protection) and wide brim hats (4-inch brim around the entire circumference of the hat) are also recommended for sun protection.    Due to recent changes in healthcare laws, you may see results of your pathology and/or laboratory studies on MyChart before the doctors have had a chance to review them. We understand that in some cases there may be results that are confusing or concerning to you. Please understand that not all results are received at the same time and often the doctors may need to interpret multiple results in order to provide you with the best plan of care or course of treatment. Therefore, we ask that you please give [place] 2 business days to thoroughly review all your results before contacting the office for clarification. Should we see a critical lab result, you will be contacted sooner.   If You Need Anything After Your Visit  If you have any questions or concerns for your doctor, please call our main line at [phone] and press option 4 to reach your doctor's medical assistant. If no one answers, please leave a voicemail as directed and we will return your call as soon as possible. Messages left after 4 pm will be answered the following business day.   You may also send [place] a message via [place]. We typically respond to MyChart messages within 1-2 business days.  For prescription refills, please ask your pharmacy to contact our office. Our fax number is [phone].  If you have an urgent issue when the clinic is closed that cannot wait until the next business day, you can page your doctor at the number below.    Please note that while we do our best to be available for urgent issues outside of office hours, we are not available 24/7.   If you have an urgent issue and are unable to reach [place], you may choose to seek medical care at your doctor's office, retail clinic, urgent care center, or emergency room.  If you have a medical emergency, please immediately  call 911 or go to the emergency department.  Pager Numbers  - Dr. Nehemiah Massed: [phone]  - Dr. Laurence Ferrari: [phone]  - Dr. Nicole Kindred: [phone]  In the event of inclement weather, please call our main line at [phone] for an update on the status of any delays or closures.  Dermatology Medication Tips: Please keep the boxes that topical medications come in in order to help keep track of the instructions about where and how to use these. Pharmacies typically print the medication instructions only on the boxes and not directly on the medication tubes.   If your medication is too expensive, please contact our office at [phone] option 4 or send [place] a message through [place].   We are unable to tell what your co-pay for medications will be in advance as this is different depending on your insurance coverage. However, we may be able to find a substitute  medication at lower cost or fill out paperwork to get insurance to cover a needed medication.   If a prior authorization is required to get your medication covered by your insurance company, please allow [place] 1-2 business days to complete this process.  Drug prices often vary depending on where the prescription is filled and some pharmacies may offer cheaper prices.  The website www.goodrx.com contains coupons for medications through different pharmacies. The prices here do not account for what the cost may be with help from insurance (it may be cheaper with your insurance), but the website can give you the price if you did not use any insurance.  - You can print the associated coupon and take it with your prescription to the pharmacy.  - You may also stop by our office during regular business hours and pick up a GoodRx coupon card.  - If you need your prescription sent electronically to a different pharmacy, notify our office through [facility] or by phone at [phone] option 4.     Si Usted Necesita Algo Despus de Su  Visita  Tambin puede enviarnos un mensaje a travs de Pharmacist, community. Por lo general respondemos a los mensajes de MyChart en el transcurso de 1 a 2 das hbiles.  Para renovar recetas, por favor pida a su farmacia que se ponga en contacto con nuestra oficina. Harland Dingwall de fax es [place] [phone].  Si tiene un asunto urgente cuando la clnica est cerrada y que no puede esperar hasta el siguiente da hbil, puede llamar/localizar a su doctor(a) al nmero que aparece a continuacin.   Por favor, tenga en cuenta que aunque hacemos todo lo posible para estar disponibles para asuntos urgentes fuera del horario de [place], no estamos disponibles las 24 horas del da, los 7 das de la [place].   Si tiene un problema urgente y no puede comunicarse con nosotros, puede optar por buscar atencin mdica  en el consultorio de su doctor(a), en una clnica privada, en un centro de atencin urgente o en una sala de emergencias.  Si tiene Engineering geologist, por favor llame inmediatamente al 911 o vaya a la sala de emergencias.  Nmeros de bper  - Dr. Nehemiah Massed: [phone]  - Dra. Moye: [phone]  - Dra. Nicole Kindred: [phone]  En caso de inclemencias del [place], por favor llame a Johnsie Kindred principal al [phone] para una actualizacin sobre el [place] de cualquier retraso o cierre.  Consejos para la medicacin en dermatologa: Por favor, guarde las cajas en las que vienen los medicamentos de uso tpico para ayudarle a seguir las instrucciones sobre dnde y cmo usarlos. Las farmacias generalmente imprimen las instrucciones del medicamento slo en las cajas y no directamente en los tubos del [place].   Si su medicamento es muy caro, por favor, pngase en contacto con Zigmund Daniel llamando al [phone] y presione la opcin 4 o envenos un mensaje a travs de Pharmacist, community.   No podemos decirle cul ser su copago por los medicamentos por adelantado ya que esto es diferente dependiendo de la  cobertura de su seguro. Sin embargo, es posible que podamos encontrar un medicamento sustituto a Electrical engineer un formulario para que el seguro cubra el medicamento que se considera necesario.   Si se requiere una autorizacin previa para que su compaa de seguros [place] su medicamento, por favor permtanos de 1 a 2 das hbiles para completar este proceso.  Los precios de los medicamentos varan con frecuencia dependiendo del Environmental consultant de  dnde se surte la receta y alguna farmacias pueden ofrecer precios ms baratos.  El sitio web www.goodrx.com tiene cupones para medicamentos de Airline pilot. Los precios aqu no tienen en cuenta lo que podra costar con la ayuda del seguro (puede ser ms barato con su seguro), pero el sitio web puede darle el precio si no utiliz Research scientist (physical sciences).  - Puede imprimir el cupn correspondiente y llevarlo con su receta a la farmacia.  - Tambin puede pasar por nuestra oficina durante el horario de atencin regular y Charity fundraiser una tarjeta de cupones de GoodRx.  - Si necesita que su receta se enve electrnicamente a una farmacia diferente, informe a nuestra oficina a travs de MyChart de [place] o por telfono llamando al [phone] y presione la opcin 4.

## 2021-09-01 ENCOUNTER — Encounter: Payer: Self-pay | Admitting: Dermatology

## 2021-11-15 ENCOUNTER — Encounter: Payer: Medicare HMO | Admitting: Family Medicine

## 2022-01-05 ENCOUNTER — Other Ambulatory Visit: Payer: Self-pay

## 2022-01-05 MED ORDER — METFORMIN HCL 500 MG PO TABS: 500.0000 mg | ORAL_TABLET | Freq: Two times a day (BID) | ORAL | 0 refills | Status: DC

## 2022-01-05 NOTE — Telephone Encounter (Signed)
Pt needs a Diabetes f/u appt and a TOC scheduled. No further refills until scheduled.

## 2022-01-05 NOTE — Telephone Encounter (Signed)
Patient has been scheduled

## 2022-03-21 ENCOUNTER — Encounter: Payer: Self-pay | Admitting: Nurse Practitioner

## 2022-03-21 ENCOUNTER — Ambulatory Visit (INDEPENDENT_AMBULATORY_CARE_PROVIDER_SITE_OTHER): Payer: Medicare HMO | Admitting: Nurse Practitioner

## 2022-03-21 ENCOUNTER — Ambulatory Visit (INDEPENDENT_AMBULATORY_CARE_PROVIDER_SITE_OTHER)
Admission: RE | Admit: 2022-03-21 | Discharge: 2022-03-21 | Disposition: A | Discharge status: Home / Self Care | Payer: Medicare HMO | Source: Ambulatory Visit | Attending: Nurse Practitioner | Admitting: Nurse Practitioner

## 2022-03-21 VITALS — BP 128/62 | HR 88 | Ht 69.0 in | Wt 204.0 lb

## 2022-03-21 DIAGNOSIS — Z85820 Personal history of malignant melanoma of skin: Secondary | ICD-10-CM | POA: Diagnosis not present

## 2022-03-21 DIAGNOSIS — E785 Hyperlipidemia, unspecified: Secondary | ICD-10-CM | POA: Diagnosis not present

## 2022-03-21 DIAGNOSIS — I2510 Atherosclerotic heart disease of native coronary artery without angina pectoris: Secondary | ICD-10-CM | POA: Diagnosis not present

## 2022-03-21 DIAGNOSIS — Z87891 Personal history of nicotine dependence: Secondary | ICD-10-CM | POA: Diagnosis not present

## 2022-03-21 DIAGNOSIS — E669 Obesity, unspecified: Secondary | ICD-10-CM

## 2022-03-21 DIAGNOSIS — I10 Essential (primary) hypertension: Secondary | ICD-10-CM

## 2022-03-21 DIAGNOSIS — E1149 Type 2 diabetes mellitus with other diabetic neurological complication: Secondary | ICD-10-CM | POA: Diagnosis not present

## 2022-03-21 DIAGNOSIS — R0609 Other forms of dyspnea: Secondary | ICD-10-CM

## 2022-03-21 DIAGNOSIS — R0600 Dyspnea, unspecified: Secondary | ICD-10-CM | POA: Diagnosis not present

## 2022-03-21 DIAGNOSIS — E1169 Type 2 diabetes mellitus with other specified complication: Secondary | ICD-10-CM | POA: Diagnosis not present

## 2022-03-21 LAB — POCT GLYCOSYLATED HEMOGLOBIN (HGB A1C): Hemoglobin A1C: 6.1 % — AB (ref 4.0–5.6)

## 2022-03-21 NOTE — Assessment & Plan Note (Signed)
Patient currently maintained on metformin A1c 6.1% today in office.  Continue metformin as prescribed

## 2022-03-21 NOTE — Patient Instructions (Signed)
Nice to see you today I will be in touch with the xray results Follow up with me for you physical in approx 2 months. We will do labs at that point Call and get in with Dr End.

## 2022-03-21 NOTE — Assessment & Plan Note (Signed)
Patient is followed by Dr. Saunders Revel cardiology.  He is on a high intensity statin.  Continue medication follow-up with cardiology as recommended

## 2022-03-21 NOTE — Assessment & Plan Note (Signed)
Patient has a cardiologist.  Needs follow-up recommended to do the same.  Will get chest x-ray in office.

## 2022-03-21 NOTE — Assessment & Plan Note (Signed)
Patient is followed by dermatology.

## 2022-03-21 NOTE — Assessment & Plan Note (Signed)
Patient currently maintained on amlodipine, enalapril, Maxide.  Patient tolerating medications well.  Blood pressure within normal limits continue medications

## 2022-03-21 NOTE — Assessment & Plan Note (Signed)
Has been quit approximately 30 years.

## 2022-03-21 NOTE — Progress Notes (Signed)
Established Patient Office Visit  Subjective   Patient ID: Christopher Oconnell, male    DOB: 06-02-1950  Age: 72 y.o. MRN: [id number]  Chief Complaint  Patient presents with   Establish Care   Shortness of Breath    W/exertion, denies wheeze, does report some tightness mid-chest, recovers with rest      Transfer of care: Last seen by Waunita Schooner, MD on 05/10/2021 Last CPE 05/10/2021  HTN: States that he will check it intermittently. Does have cuff at home   CAD: Nelva Bush, MD a year ago in June.   DM2: Every other day. States that he checked it this morning. States it depends what he eats. He will check it Qam and PM on the same day. 99 fasting this morning  High 230 Low 77  HLD: Currenlty on atorvastatin   Former smoker: Has been stopped for 30 years  DOE: States that he does a lot of hunting. 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 States last year he had no trouble. States thsi year he noticed some tightness in the chest halfway of and would continue and then would pause at the top.  States that doing work outside and feels [facility] no chest pain. States   Tdap: unsure Flu: 12/2021 Pna: utd Shingles: utd  Colonsocpy: 2023, repeat 5 years      Review of Systems  Constitutional:  Negative for chills and fever.  Respiratory:  Positive for shortness of breath.   Cardiovascular:  Negative for chest pain.  Gastrointestinal:  Negative for abdominal pain, blood in stool, constipation, diarrhea, nausea and vomiting.       BM daily   Genitourinary:  Negative for dysuria and frequency.  Neurological:  Negative for headaches.  Psychiatric/Behavioral:  Negative for hallucinations and suicidal ideas.       Objective:     BP 128/62   Pulse 88   Ht '5\' 9"'$  (1.753 m)   Wt 204 lb (92.5 kg)   SpO2 96%   BMI 30.13 kg/m    Physical Exam Vitals and nursing note reviewed.  Constitutional:      Appearance: Normal appearance. He is obese.  HENT:     Right Ear:  Tympanic membrane, ear canal and external ear normal.     Left Ear: Tympanic membrane, ear canal and external ear normal.     Mouth/Throat:     Mouth: Mucous membranes are moist.     Pharynx: Oropharynx is clear.  Eyes:     Extraocular Movements: Extraocular movements intact.     Pupils: Pupils are equal, round, and reactive to light.  Cardiovascular:     Rate and Rhythm: Normal rate and regular rhythm.     Heart sounds: Normal heart sounds.  Pulmonary:     Effort: Pulmonary effort is normal.     Breath sounds: Normal breath sounds.  Abdominal:     General: Bowel sounds are normal. There is no distension.     Palpations: There is no mass.     Tenderness: There is no abdominal tenderness.     Hernia: No hernia is present.  Musculoskeletal:     Right lower leg: No edema.     Left lower leg: No edema.  Lymphadenopathy:     Cervical: No cervical adenopathy.  Skin:    General: Skin is warm.  Neurological:     General: No focal deficit present.     Mental Status: He is alert.  Results for orders placed or performed in visit on 03/21/22  HgB A1c  Result Value Ref Range   Hemoglobin A1C 6.1 (A) 4.0 - 5.6 %   HbA1c POC (<> result, manual entry)     HbA1c, POC (prediabetic range)     HbA1c, POC (controlled diabetic range)        The 10-year ASCVD risk score (Arnett DK, et al., 2019) is: 38.7%    Assessment & Plan:   Problem List Items Addressed This Visit       Cardiovascular and Mediastinum   Coronary artery disease involving native coronary artery of native heart without angina pectoris    Patient is followed by Dr. Saunders Revel cardiology.  He is on a high intensity statin.  Continue medication follow-up with cardiology as recommended      Essential hypertension    Patient currently maintained on amlodipine, enalapril, Maxide.  Patient tolerating medications well.  Blood pressure within normal limits continue medications        Endocrine   Type 2 diabetes mellitus  with neurological complications ([place]) - Primary    Patient currently maintained on metformin A1c 6.1% today in office.  Continue metformin as prescribed      Relevant Orders   HgB A1c (Completed)   Hyperlipidemia associated with type 2 diabetes mellitus (HCC)    Currently on atorvastatin 40 mg.  Continue        Other   Hyperlipidemia LDL goal <70   History of melanoma    Patient is followed by dermatology.      Former smoker    Has been quit approximately 30 years.      Relevant Orders   DG Chest 2 View   DOE (dyspnea on exertion)    Patient has a cardiologist.  Needs follow-up recommended to do the same.  Will get chest x-ray in office.      Relevant Orders   DG Chest 2 View   Other Visit Diagnoses     Obesity (BMI 30-39.9)           Return in about 2 months (around 05/20/2022) for CPE and Labs.    Romilda Garret, NP

## 2022-03-21 NOTE — Assessment & Plan Note (Signed)
Currently on atorvastatin 40 mg.  Continue

## 2022-03-22 ENCOUNTER — Ambulatory Visit: Admission: RE | Admit: 2022-03-22 | Payer: Medicare HMO | Source: Ambulatory Visit

## 2022-03-22 ENCOUNTER — Telehealth: Payer: Self-pay | Admitting: Nurse Practitioner

## 2022-03-22 ENCOUNTER — Encounter: Payer: Self-pay | Admitting: Nurse Practitioner

## 2022-03-22 DIAGNOSIS — Z87891 Personal history of nicotine dependence: Secondary | ICD-10-CM

## 2022-03-22 DIAGNOSIS — R0609 Other forms of dyspnea: Secondary | ICD-10-CM

## 2022-03-22 NOTE — Telephone Encounter (Signed)
Provider aware

## 2022-03-22 NOTE — Telephone Encounter (Signed)
-----   Message from [place], [place] sent at 03/22/2022 11:10 AM EST ----- Spoke with patient regarding results and recommendations. Patient states he has NOT had a cough, wheeze, fever/chills. He does report occasional body aches and shob w/exertion. Pt agreeable to CT scan, would prefer [place].   Please advise, thanks!

## 2022-03-23 ENCOUNTER — Ambulatory Visit
Admission: RE | Admit: 2022-03-23 | Discharge: 2022-03-23 | Disposition: A | Discharge status: Home / Self Care | Payer: Medicare HMO | Source: Ambulatory Visit | Attending: Nurse Practitioner | Admitting: Nurse Practitioner

## 2022-03-23 DIAGNOSIS — R0609 Other forms of dyspnea: Secondary | ICD-10-CM | POA: Insufficient documentation

## 2022-03-23 DIAGNOSIS — J189 Pneumonia, unspecified organism: Secondary | ICD-10-CM | POA: Diagnosis not present

## 2022-03-23 DIAGNOSIS — Z87891 Personal history of nicotine dependence: Secondary | ICD-10-CM | POA: Diagnosis not present

## 2022-03-23 DIAGNOSIS — J479 Bronchiectasis, uncomplicated: Secondary | ICD-10-CM | POA: Diagnosis not present

## 2022-03-26 ENCOUNTER — Telehealth: Payer: Self-pay | Admitting: Nurse Practitioner

## 2022-03-26 DIAGNOSIS — R0609 Other forms of dyspnea: Secondary | ICD-10-CM

## 2022-03-26 DIAGNOSIS — J849 Interstitial pulmonary disease, unspecified: Secondary | ICD-10-CM

## 2022-03-26 NOTE — Telephone Encounter (Signed)
Patient called in and had some questions in regards to being referred to a lung specialist. He can be reached at [phone]. Thank you!

## 2022-03-26 NOTE — Telephone Encounter (Signed)
Patient agrees to [facility] referral, would prefer [place] location. Order placed for PCP to sign.   Please review, thanks!

## 2022-04-02 ENCOUNTER — Encounter: Payer: Self-pay | Admitting: Student in an Organized Health Care Education/Training Program

## 2022-04-02 ENCOUNTER — Ambulatory Visit: Payer: Medicare HMO | Admitting: Student in an Organized Health Care Education/Training Program

## 2022-04-02 VITALS — BP 140/70 | HR 98 | Temp 97.7°F | Ht 70.0 in | Wt 204.4 lb

## 2022-04-02 DIAGNOSIS — J849 Interstitial pulmonary disease, unspecified: Secondary | ICD-10-CM | POA: Diagnosis not present

## 2022-04-02 DIAGNOSIS — R0602 Shortness of breath: Secondary | ICD-10-CM | POA: Diagnosis not present

## 2022-04-02 NOTE — Patient Instructions (Signed)
Today, I ordered blood work. You can get them draw at your preferred [facility] draw station. The nearest one to [facility] is at nearby [place] ([address], [place], [place] [address]).

## 2022-04-02 NOTE — Progress Notes (Signed)
Synopsis: Referred in for shortness of breath by Michela Pitcher, NP  Assessment & Plan:   1. Shortness of breath 2. ILD (interstitial lung disease) ([place])  Presenting for the evaluation of shortness of breath with a chest CT showing subpleural reticulation. On my review, there's no traction bronchiectasis or honey combing. The distribution does not show a prediliction for the lower lobes and is not consistent with a UIP pattern. My differential for the findings on Christopher Oconnell's chest CT includes hypersensitivity pneumonitis, NSIP, and connective tissue disease associated ILD.  I will obtain an auto-immune workup, myositis panel, and present his case during the up-coming ILD conference. Should workup not be revealing, I will consider referring him to thoracic surgery for a surgical wedge biopsy to attain a diagnosis.  - ANA 12 Plus Profile (RDL) - ANCA Profile - Anti-CCP Ab, IgG + IgA (RDL) - Aldolase - CK - Rheumatoid factor - MyoMarker 3 Plus Profile (RDL) - Hypersensitivity Pneumonitis - Allergen Panel (27) + IGE - CBC with Differential/Platelet   Return in about 2 weeks (around 04/16/2022).  I spent 60 minutes caring for this patient today, including preparing to see the patient, obtaining a medical history , reviewing a separately obtained history, performing a medically appropriate examination and/or evaluation, counseling and educating the patient/family/caregiver, ordering medications, tests, or procedures, referring and communicating with other health care professionals (not separately reported), documenting clinical information in the electronic health record, and independently interpreting results (not separately reported/billed) and communicating results to the patient/family/caregiver  Armando Reichert, MD [facility] 04/02/2022 9:41 AM    End of visit medications:  No orders of the defined types were placed in this encounter.    Current  Outpatient Medications:    amLODipine (NORVASC) 10 MG tablet, Take 1 tablet (10 mg total) by mouth daily., Disp: 90 tablet, Rfl: 3   aspirin EC 81 MG tablet, Take 81 mg by mouth daily., Disp: , Rfl:    atorvastatin (LIPITOR) 40 MG tablet, TAKE 1 TABLET BY MOUTH EVERY DAY, Disp: 90 tablet, Rfl: 3   CINNAMON PO, Take 200 mg by mouth 2 (two) times daily., Disp: , Rfl:    enalapril (VASOTEC) 20 MG tablet, Take 1 tablet (20 mg total) by mouth daily., Disp: 90 tablet, Rfl: 3   FREESTYLE LITE test strip, USE TO CHECK BLOOD GLUCOSE UP TO 3 TIMES DAILY., Disp: 100 strip, Rfl: 2   metFORMIN (GLUCOPHAGE) 500 MG tablet, Take 1 tablet (500 mg total) by mouth 2 (two) times daily with a meal., Disp: 180 tablet, Rfl: 0   Multiple Vitamin (MULTIVITAMIN) tablet, Take 1 tablet by mouth daily., Disp: , Rfl:    Omega-3 Fatty Acids (FISH OIL PO), Take 1,200 mg by mouth. Takes 1 tablet qd., Disp: , Rfl:    ondansetron (ZOFRAN) 4 MG tablet, Take 1 tablet (4 mg total) by mouth every 8 (eight) hours as needed for nausea or vomiting., Disp: 5 tablet, Rfl: 0   Potassium Gluconate 550 MG TABS, Take by mouth daily as needed., Disp: , Rfl:    triamterene-hydrochlorothiazide (MAXZIDE-25) 37.5-25 MG tablet, Take 1 tablet by mouth every morning., Disp: 90 tablet, Rfl: 3   Turmeric (QC TUMERIC COMPLEX PO), Take by mouth daily., Disp: , Rfl:    Subjective:   PATIENT ID: Christopher Oconnell GENDER: male DOB: [birth year], MRN: [id number]  Chief Complaint  Patient presents with   pulmonary consult    SOB with exertion and dry cough at times prod with  clear sputum.     HPI  72 year old male presenting to clinic for the evaluation of shortness of breath.  The patient noticed he was getting short of breath with exertion recently prompting him to get evaluated by his primary care provider. He usually partakes in deer hunting and there is a hill near their house that him and his wife normally easily walk up, but he got extremely  short of breath walking it this season. He had to stop a couple of times to catch his breath. He has no cough, sputum production, chest pain, wheezing, chest tightness, hemoptysis, rashes, or skin lesions. On further questioning, the patient does report that he is getting somewhat weaker over the years.  He used to love bow and arrow hunting but has not been able to partake in it secondary to what he describes as shoulder weakness.  He is unable to exert enough strength to shoot an arrow. He has also noticed that he is lost some muscle weight since he retired. This is unusual for him and he describes himself as a strong man. He also reports aches and pains in his joints and muscles that he attributes to age.  He has a longstanding history of smoking but did quit in 1993.  [place] did have a mold infestation around 3 years ago.  They did get it professionally remediated but he has not looked for any mold since.  He used to have animals on the farm but none since, he only currently has a dog. 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 [employer] reported.  Ancillary information including prior medications, full medical/surgical/family/social histories, and PFTs (when available) are listed below and have been reviewed.   Review of Systems  Constitutional:  Negative for chills, fever and malaise/fatigue.  Respiratory:  Positive for cough and shortness of breath. Negative for hemoptysis, sputum production and wheezing.   Cardiovascular:  Negative for chest pain, leg swelling and PND.  Musculoskeletal:  Positive for joint pain and myalgias.     Objective:   Vitals:   04/02/22 0858  BP: (!) 140/70  Pulse: 98  Temp: 97.7 F (36.5 C)  TempSrc: Temporal  SpO2: 97%  Weight: 204 lb 6.4 oz (92.7 kg)  Height: '5\' 10"'$  (1.778 m)   97% on RA  BMI Readings from Last 3 Encounters:  04/02/22 29.33 kg/m  03/21/22 30.13 kg/m  08/24/21 29.53 kg/m    Wt Readings from Last 3 Encounters:  04/02/22 204 lb 6.4 oz (92.7 kg)  03/21/22 204 lb (92.5 kg)  08/24/21 200 lb (90.7 kg)    Physical Exam Constitutional:      General: He is not in acute distress.    Appearance: Normal appearance. He is not ill-appearing.  HENT:     Head: Normocephalic.     Nose: Nose normal.     Mouth/Throat:     Mouth: Mucous membranes are moist.  Cardiovascular:     Rate and Rhythm: Normal rate and regular rhythm.     Pulses: Normal pulses.     Heart sounds: Normal heart sounds.  Pulmonary:     Effort: Pulmonary effort is normal.     Breath sounds: Rales present.  Abdominal:     General: Abdomen is flat.     Palpations: Abdomen is soft.  Musculoskeletal:     Cervical back: Neck supple.  Neurological:     General: No focal deficit present.  Mental Status: He is alert and oriented to person, place, and time. Mental status is at baseline.     Ancillary Information    Past Medical History:  Diagnosis Date   Arthritis    Asthma    Atypical mole 10/19/2020   R med bicep, mod to severe, exc 11/08/20   Basal cell carcinoma 11/05/2019   Right ear above crus. Nodular and infiltrative patterns. Excised 01/19/2020, margins free.   Coronary artery disease    Diabetes mellitus without complication ([place])    Dysplastic nevus 11/08/2020   left med bicep at junction of L deltoid - moderate   Dysplastic nevus 11/08/2020   left med bicep at junction of L deltoid, moderate atypia   GERD (gastroesophageal reflux disease)    Hyperlipidemia    Hypertension      Family History  Problem Relation Age of Onset   Dementia Mother    Coronary artery disease Father    Melanoma Father    Pancreatic cancer Father    Breast cancer Sister    Breast cancer Sister    Drug abuse Sister    Cancer Maternal Grandmother        unsure of the type   Heart attack Paternal Grandmother    Heart attack Paternal Grandfather      Past Surgical History:  Procedure  Laterality Date   CORONARY ANGIOPLASTY     No stents   NOSE SURGERY     SKIN CANCER EXCISION  01/2020   right ear    TONSILLECTOMY      Social History   Socioeconomic History   Marital status: Married    Spouse name: [name]   Number of children: 0   Years of education: [employer] school   Highest education level: Not on file  Occupational History   Not on file  Tobacco Use   Smoking status: Former    Packs/day: 3.50    Years: 30.00    Total pack years: 105.00    Types: Cigarettes    Quit date: 1993    Years since quitting: 31.0   Smokeless tobacco: Never  Vaping Use   Vaping Use: Never used  Substance and Sexual Activity   Alcohol use: Yes    Comment: 15 beers on the weekends   Drug use: Never   Sexual activity: Not Currently  Other Topics Concern   Not on file  Social History Narrative   07/30/19   From: the area   Living: with wife, Jackelyn Poling (1986, together since 1981)   Work: Retired, Dealer work in 2013      Family: sisters live nearby, and in-laws are in [place]      Enjoys: hunting, fishing, golf (occasionally)      Exercise: daily activities, yard work   Diet: meat - primarily venison, salads, fish, chicken - no red meat      Safety   Seat belts: Yes    Guns: Yes  and not currently secure   Safe in relationships: Yes    Social Determinants of Radio broadcast assistant Strain: Low Risk  (04/27/2021)   Overall Financial Resource Strain (CARDIA)    Difficulty of Paying Living Expenses: Not hard at all  Food Insecurity: No Food Insecurity (04/27/2021)   Hunger Vital Sign    Worried About Running Out of Food in the Last Year: Never true    Ran Out of Food in the Last Year: Never true  Transportation Needs: No Transportation Needs (04/27/2021)  PRAPARE - Hydrologist (Medical): No    Lack of Transportation (Non-Medical): No  Physical Activity: Inactive (04/27/2021)   Exercise Vital Sign    Days of Exercise per Week: 0 days     Minutes of Exercise per Session: 0 min  Stress: No Stress Concern Present (04/27/2021)   [place]    Feeling of Stress : Not at all  Social Connections: Moderately Isolated (04/27/2021)   Social Connection and Isolation Panel [NHANES]    Frequency of Communication with Friends and Family: More than three times a week    Frequency of Social Gatherings with Friends and Family: Three times a week    Attends Religious Services: Never    Active Member of Clubs or Organizations: No    Attends Archivist Meetings: Never    Marital Status: Married  Human resources officer Violence: Not At Risk (04/27/2021)   Humiliation, Afraid, Rape, and Kick questionnaire    Fear of Current or Ex-Partner: No    Emotionally Abused: No    Physically Abused: No    Sexually Abused: No     No Known Allergies   CBC    Component Value Date/Time   WBC 9.3 05/10/2021 0911   RBC 4.67 05/10/2021 0911   HGB 15.1 05/10/2021 0911   HCT 44.8 05/10/2021 0911   PLT 276.0 05/10/2021 0911   MCV 95.9 05/10/2021 0911   MCH 32.1 12/07/2009 0456   MCHC 33.8 05/10/2021 0911   RDW 12.4 05/10/2021 0911   LYMPHSABS 2.2 05/10/2021 0911   MONOABS 0.9 05/10/2021 0911   EOSABS 0.2 05/10/2021 0911   BASOSABS 0.0 05/10/2021 0911    Pulmonary Functions Testing Results:     No data to display          Outpatient Medications Prior to Visit  Medication Sig Dispense Refill   amLODipine (NORVASC) 10 MG tablet Take 1 tablet (10 mg total) by mouth daily. 90 tablet 3   aspirin EC 81 MG tablet Take 81 mg by mouth daily.     atorvastatin (LIPITOR) 40 MG tablet TAKE 1 TABLET BY MOUTH EVERY DAY 90 tablet 3   CINNAMON PO Take 200 mg by mouth 2 (two) times daily.     enalapril (VASOTEC) 20 MG tablet Take 1 tablet (20 mg total) by mouth daily. 90 tablet 3   FREESTYLE LITE test strip USE TO CHECK BLOOD GLUCOSE UP TO 3 TIMES DAILY. 100 strip 2   metFORMIN  (GLUCOPHAGE) 500 MG tablet Take 1 tablet (500 mg total) by mouth 2 (two) times daily with a meal. 180 tablet 0   Multiple Vitamin (MULTIVITAMIN) tablet Take 1 tablet by mouth daily.     Omega-3 Fatty Acids (FISH OIL PO) Take 1,200 mg by mouth. Takes 1 tablet qd.     ondansetron (ZOFRAN) 4 MG tablet Take 1 tablet (4 mg total) by mouth every 8 (eight) hours as needed for nausea or vomiting. 5 tablet 0   Potassium Gluconate 550 MG TABS Take by mouth daily as needed.     triamterene-hydrochlorothiazide (MAXZIDE-25) 37.5-25 MG tablet Take 1 tablet by mouth every morning. 90 tablet 3   Turmeric (QC TUMERIC COMPLEX PO) Take by mouth daily.     No facility-administered medications prior to visit.

## 2022-04-03 ENCOUNTER — Telehealth: Payer: Self-pay | Admitting: Student in an Organized Health Care Education/Training Program

## 2022-04-03 NOTE — Telephone Encounter (Signed)
I have spoke with Christopher Oconnell about scheduling his PFT he wants to wait until he gets the labwork back before scheduling. He will keep his appt on 04/18/22 with Dr. Genia Harold and will schedule PFT on that day if he still needs to

## 2022-04-03 NOTE — Telephone Encounter (Signed)
States he is ret a call from [facility] Mel said to send it to [facility]. Please call PT back. TY  [phone]

## 2022-04-04 ENCOUNTER — Other Ambulatory Visit: Payer: Self-pay | Admitting: Family

## 2022-04-14 LAB — ALLERGEN PANEL (27) + IGE
Alternaria Alternata IgE: 0.32 kU/L — AB
Aspergillus Fumigatus IgE: <0.1 kU/L
Bahia Grass IgE: <0.1 kU/L
Bermuda Grass IgE: <0.1 kU/L
Cat Dander IgE: 0.12 kU/L — AB
Cedar, Mountain IgE: <0.1 kU/L
Cladosporium Herbarum IgE: <0.1 kU/L
Cocklebur IgE: <0.1 kU/L
Cockroach, American IgE: <0.1 kU/L
Common Silver Birch IgE: <0.1 kU/L
D Farinae IgE: 0.25 kU/L — AB
D Pteronyssinus IgE: 0.3 kU/L — AB
Dog Dander IgE: 0.2 kU/L — AB
Elm, American IgE: <0.1 kU/L
Hickory, White IgE: <0.1 kU/L
IgE (Immunoglobulin E), Serum: 164 IU/mL (ref 6–495)
Johnson Grass IgE: <0.1 kU/L
Kentucky Bluegrass IgE: <0.1 kU/L
Maple/Box Elder IgE: <0.1 kU/L
Mucor Racemosus IgE: <0.1 kU/L
Oak, White IgE: <0.1 kU/L
Penicillium Chrysogen IgE: <0.1 kU/L
Pigweed, Rough IgE: <0.1 kU/L
Plantain, English IgE: <0.1 kU/L
Ragweed, Short IgE: <0.1 kU/L
Setomelanomma Rostrat: <0.1 kU/L
Timothy Grass IgE: <0.1 kU/L
White Mulberry IgE: <0.1 kU/L

## 2022-04-14 LAB — ANA 12 PLUS PROFILE (RDL): Anti-Nuclear Ab by IFA (RDL): NEGATIVE

## 2022-04-14 LAB — ANCA PROFILE
Anti-MPO Antibodies: <0.2 units (ref 0.0–0.9)
Anti-PR3 Antibodies: <0.2 units (ref 0.0–0.9)
Atypical pANCA: <1:20 {titer}
C-ANCA: <1:20 {titer}
P-ANCA: <1:20 {titer}

## 2022-04-14 LAB — CK: Total CK: 86 U/L (ref 41–331)

## 2022-04-14 LAB — MYOMARKER 3 PLUS PROFILE (RDL)

## 2022-04-14 LAB — CBC WITH DIFFERENTIAL/PLATELET
Basophils Absolute: 0.1 10*3/uL (ref 0.0–0.2)
Basos: 1 %
EOS (ABSOLUTE): 0.2 10*3/uL (ref 0.0–0.4)
Eos: 2 %
Hematocrit: 47 % (ref 37.5–51.0)
Hemoglobin: 15.8 g/dL (ref 13.0–17.7)
Immature Grans (Abs): 0 10*3/uL (ref 0.0–0.1)
Immature Granulocytes: 0 %
Lymphocytes Absolute: 1.9 10*3/uL (ref 0.7–3.1)
Lymphs: 20 %
MCH: 31.5 pg (ref 26.6–33.0)
MCHC: 33.6 g/dL (ref 31.5–35.7)
MCV: 94 fL (ref 79–97)
Monocytes Absolute: 1 10*3/uL — ABNORMAL HIGH (ref 0.1–0.9)
Monocytes: 10 %
Neutrophils Absolute: 6.6 10*3/uL (ref 1.4–7.0)
Neutrophils: 67 %
Platelets: 336 10*3/uL (ref 150–450)
RBC: 5.02 x10E6/uL (ref 4.14–5.80)
RDW: 11.9 % (ref 11.6–15.4)
WBC: 9.9 10*3/uL (ref 3.4–10.8)

## 2022-04-14 LAB — HYPERSENSITIVITY PNEUMONITIS
A. Pullulans Abs: NEGATIVE
A.Fumigatus #1 Abs: NEGATIVE
Micropolyspora faeni, IgG: NEGATIVE
Pigeon Serum Abs: NEGATIVE
Thermoact. Saccharii: NEGATIVE
Thermoactinomyces vulgaris, IgG: NEGATIVE

## 2022-04-14 LAB — ALDOLASE: Aldolase: 3.7 U/L (ref 3.3–10.3)

## 2022-04-14 LAB — ANTI-RO/NEG ANA: Anti-Ro (SS-A) Ab (RDL): <20 Units (ref <20)

## 2022-04-14 LAB — RHEUMATOID FACTOR: Rheumatoid fact SerPl-aCnc: <10 IU/mL (ref <14.0)

## 2022-04-14 LAB — ANTI-CCP AB, IGG + IGA (RDL): Anti-CCP Ab, IgG + IgA (RDL): <20 Units (ref <20)

## 2022-04-18 ENCOUNTER — Other Ambulatory Visit: Payer: Self-pay

## 2022-04-18 ENCOUNTER — Encounter: Payer: Self-pay | Admitting: Student in an Organized Health Care Education/Training Program

## 2022-04-18 ENCOUNTER — Ambulatory Visit: Payer: Medicare HMO | Admitting: Student in an Organized Health Care Education/Training Program

## 2022-04-18 VITALS — BP 136/72 | HR 74 | Temp 97.7°F | Ht 69.0 in | Wt 201.8 lb

## 2022-04-18 DIAGNOSIS — J849 Interstitial pulmonary disease, unspecified: Secondary | ICD-10-CM | POA: Diagnosis not present

## 2022-04-18 DIAGNOSIS — R0602 Shortness of breath: Secondary | ICD-10-CM | POA: Diagnosis not present

## 2022-04-18 NOTE — Progress Notes (Signed)
Synopsis: Follow up on ILD  Assessment & Plan:   #Interstitial Lung Disease #Shortness of Breath   Presenting for the evaluation of shortness of breath with a chest CT showing subpleural reticulation. On my review, there's no traction bronchiectasis or honey combing. The distribution does not show a prediliction for the lower lobes and is not consistent with a UIP pattern. My differential for the findings on Christopher Oconnell's chest CT includes hypersensitivity pneumonitis and NSIP.   I did obtain an auto-immune workup, myositis panel, all of which have returned negative. I will provide him with our ILD questionnaire, and wil be discussing his case at the multi-disciplinary ILD board next week. We will discuss biopsy vs empiric treatment vs close observation.  -ILD multi-disciplinary conference  No follow-ups on file.  I spent 25 minutes caring for this patient today, including preparing to see the patient, obtaining a medical history , reviewing a separately obtained history, performing a medically appropriate examination and/or evaluation, counseling and educating the patient/family/caregiver, and documenting clinical information in the electronic health record  Armando Reichert, MD [facility] 04/18/2022 8:52 AM    End of visit medications:  No orders of the defined types were placed in this encounter.    Current Outpatient Medications:    amLODipine (NORVASC) 10 MG tablet, Take 1 tablet (10 mg total) by mouth daily., Disp: 90 tablet, Rfl: 3   aspirin EC 81 MG tablet, Take 81 mg by mouth daily., Disp: , Rfl:    atorvastatin (LIPITOR) 40 MG tablet, TAKE 1 TABLET BY MOUTH EVERY DAY, Disp: 90 tablet, Rfl: 3   CINNAMON PO, Take 200 mg by mouth 2 (two) times daily., Disp: , Rfl:    enalapril (VASOTEC) 20 MG tablet, Take 1 tablet (20 mg total) by mouth daily., Disp: 90 tablet, Rfl: 3   FREESTYLE LITE test strip, USE TO CHECK BLOOD GLUCOSE UP TO 3 TIMES DAILY., Disp:  100 strip, Rfl: 2   metFORMIN (GLUCOPHAGE) 500 MG tablet, Take 1 tablet (500 mg total) by mouth 2 (two) times daily with a meal., Disp: 180 tablet, Rfl: 0   Multiple Vitamin (MULTIVITAMIN) tablet, Take 1 tablet by mouth daily., Disp: , Rfl:    Omega-3 Fatty Acids (FISH OIL PO), Take 1,200 mg by mouth. Takes 1 tablet qd., Disp: , Rfl:    ondansetron (ZOFRAN) 4 MG tablet, Take 1 tablet (4 mg total) by mouth every 8 (eight) hours as needed for nausea or vomiting., Disp: 5 tablet, Rfl: 0   Potassium Gluconate 550 MG TABS, Take by mouth daily as needed., Disp: , Rfl:    triamterene-hydrochlorothiazide (MAXZIDE-25) 37.5-25 MG tablet, Take 1 tablet by mouth every morning., Disp: 90 tablet, Rfl: 3   Turmeric (QC TUMERIC COMPLEX PO), Take by mouth daily., Disp: , Rfl:    Subjective:   PATIENT ID: Christopher Oconnell GENDER: male DOB: 10-07-1950, MRN: [id number]  Chief Complaint  Patient presents with   Follow-up    Dry cough at times prod with clear sputum.     HPI  72 year old male presenting to clinic for the evaluation of shortness of breath.   The patient noticed he was getting short of breath with exertion recently prompting him to get evaluated by his primary care provider. He usually partakes in deer hunting and there is a hill near their house that him and his wife normally easily walk up, but he got extremely short of breath walking it this season. He had to stop a couple  of times to catch his breath. He has no cough, sputum production, chest pain, wheezing, chest tightness, hemoptysis, rashes, or skin lesions. On further questioning, the patient does report that he is getting somewhat weaker over the years. He used to love bow and arrow hunting but has not been able to partake in it secondary to what he describes as shoulder weakness.  He is unable to exert enough strength to shoot an arrow. He has also noticed that he is lost some muscle weight since he retired. This is unusual for him and he  describes himself as a strong man. He also reports aches and pains in his joints and muscles that he attributes to age.  Today, he feels a bit improved compared to prior. He did inspect his heating system for mold and found some residual black mold in the pump that he got cleaned. He did not look through the ventilation ducts, however. He also tells me the dog that he has does sleep in their bedroom in the cold months. Christopher Oconnell also reported that his wife has had a broken thigh for which she is requiring care - he is unable to go into the hospital at the moment as he is her primary care giver.   He has a longstanding history of smoking but did quit in 1993.  [place] did have a mold infestation around 3 years ago.  They did get it professionally remediated but he has not looked for any mold since.  He used to have animals on the farm but none since, he only currently has a dog. 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 [employer] reported.  Ancillary information including prior medications, full medical/surgical/family/social histories, and PFTs (when available) are listed below and have been reviewed.   Review of Systems  Constitutional:  Negative for chills, fever and malaise/fatigue.  Respiratory:  Positive for shortness of breath. Negative for cough, hemoptysis, sputum production and wheezing.   Cardiovascular:  Negative for chest pain, leg swelling and PND.  Musculoskeletal:  Positive for joint pain.     Objective:   Vitals:   04/18/22 0839  BP: 136/72  Pulse: 74  Temp: 97.7 F (36.5 C)  TempSrc: Temporal  SpO2: 97%  Weight: 201 lb 12.8 oz (91.5 kg)  Height: '5\' 9"'$  (1.753 m)   97% on RA BMI Readings from Last 3 Encounters:  04/18/22 29.80 kg/m  04/02/22 29.33 kg/m  03/21/22 30.13 kg/m   Wt Readings from Last 3 Encounters:  04/18/22 201 lb 12.8 oz (91.5 kg)  04/02/22 204 lb 6.4 oz (92.7 kg)  03/21/22 204 lb (92.5  kg)    Physical Exam Constitutional:      General: He is not in acute distress.    Appearance: Normal appearance. He is not ill-appearing.  HENT:     Head: Normocephalic.     Nose: Nose normal.     Mouth/Throat:     Mouth: Mucous membranes are moist.  Cardiovascular:     Rate and Rhythm: Normal rate and regular rhythm.     Pulses: Normal pulses.     Heart sounds: Normal heart sounds.  Pulmonary:     Effort: Pulmonary effort is normal.     Breath sounds: Rales (faint bibasilar rales) present.  Abdominal:     General: Abdomen is flat.     Palpations: Abdomen is soft.  Musculoskeletal:     Cervical back: Neck  supple.  Neurological:     General: No focal deficit present.     Mental Status: He is alert and oriented to person, place, and time. Mental status is at baseline.       Ancillary Information    Past Medical History:  Diagnosis Date   Arthritis    Asthma    Atypical mole 10/19/2020   R med bicep, mod to severe, exc 11/08/20   Basal cell carcinoma 11/05/2019   Right ear above crus. Nodular and infiltrative patterns. Excised 01/19/2020, margins free.   Coronary artery disease    Diabetes mellitus without complication ([place])    Dysplastic nevus 11/08/2020   left med bicep at junction of L deltoid - moderate   Dysplastic nevus 11/08/2020   left med bicep at junction of L deltoid, moderate atypia   GERD (gastroesophageal reflux disease)    Hyperlipidemia    Hypertension      Family History  Problem Relation Age of Onset   Dementia Mother    Coronary artery disease Father    Melanoma Father    Pancreatic cancer Father    Breast cancer Sister    Breast cancer Sister    Drug abuse Sister    Cancer Maternal Grandmother        unsure of the type   Heart attack Paternal Grandmother    Heart attack Paternal Grandfather      Past Surgical History:  Procedure Laterality Date   CORONARY ANGIOPLASTY     No stents   NOSE SURGERY     SKIN CANCER EXCISION   01/2020   right ear    TONSILLECTOMY      Social History   Socioeconomic History   Marital status: Married    Spouse name: [name]   Number of children: 0   Years of education: [employer] school   Highest education level: Not on file  Occupational History   Not on file  Tobacco Use   Smoking status: Former    Packs/day: 3.50    Years: 30.00    Total pack years: 105.00    Types: Cigarettes    Quit date: 1993    Years since quitting: 31.1   Smokeless tobacco: Never  Vaping Use   Vaping Use: Never used  Substance and Sexual Activity   Alcohol use: Yes    Comment: 15 beers on the weekends   Drug use: Never   Sexual activity: Not Currently  Other Topics Concern   Not on file  Social History Narrative   07/30/19   From: the area   Living: with wife, Jackelyn Poling (1986, together since 1981)   Work: Retired, Dealer work in 2013      Family: sisters live nearby, and in-laws are in [place]      Enjoys: hunting, fishing, golf (occasionally)      Exercise: daily activities, yard work   Diet: meat - primarily venison, salads, fish, chicken - no red meat      Safety   Seat belts: Yes    Guns: Yes  and not currently secure   Safe in relationships: Yes    Social Determinants of Radio broadcast assistant Strain: Low Risk  (04/27/2021)   Overall Financial Resource Strain (CARDIA)    Difficulty of Paying Living Expenses: Not hard at all  Food Insecurity: No Food Insecurity (04/27/2021)   Hunger Vital Sign    Worried About Running Out of Food in the Last Year: Never true    Ran  Out of Food in the Last Year: Never true  Transportation Needs: No Transportation Needs (04/27/2021)   PRAPARE - Hydrologist (Medical): No    Lack of Transportation (Non-Medical): No  Physical Activity: Inactive (04/27/2021)   Exercise Vital Sign    Days of Exercise per Week: 0 days    Minutes of Exercise per Session: 0 min  Stress: No Stress Concern Present (04/27/2021)    [place]    Feeling of Stress : Not at all  Social Connections: Moderately Isolated (04/27/2021)   Social Connection and Isolation Panel [NHANES]    Frequency of Communication with Friends and Family: More than three times a week    Frequency of Social Gatherings with Friends and Family: Three times a week    Attends Religious Services: Never    Active Member of Clubs or Organizations: No    Attends Archivist Meetings: Never    Marital Status: Married  Human resources officer Violence: Not At Risk (04/27/2021)   Humiliation, Afraid, Rape, and Kick questionnaire    Fear of Current or Ex-Partner: No    Emotionally Abused: No    Physically Abused: No    Sexually Abused: No     No Known Allergies   CBC    Component Value Date/Time   WBC 9.9 04/02/2022 1005   WBC 9.3 05/10/2021 0911   RBC 5.02 04/02/2022 1005   RBC 4.67 05/10/2021 0911   HGB 15.8 04/02/2022 1005   HCT 47.0 04/02/2022 1005   PLT 336 04/02/2022 1005   MCV 94 04/02/2022 1005   MCH 31.5 04/02/2022 1005   MCH 32.1 12/07/2009 0456   MCHC 33.6 04/02/2022 1005   MCHC 33.8 05/10/2021 0911   RDW 11.9 04/02/2022 1005   LYMPHSABS 1.9 04/02/2022 1005   MONOABS 0.9 05/10/2021 0911   EOSABS 0.2 04/02/2022 1005   BASOSABS 0.1 04/02/2022 1005    Pulmonary Functions Testing Results:     No data to display          Outpatient Medications Prior to Visit  Medication Sig Dispense Refill   amLODipine (NORVASC) 10 MG tablet Take 1 tablet (10 mg total) by mouth daily. 90 tablet 3   aspirin EC 81 MG tablet Take 81 mg by mouth daily.     atorvastatin (LIPITOR) 40 MG tablet TAKE 1 TABLET BY MOUTH EVERY DAY 90 tablet 3   CINNAMON PO Take 200 mg by mouth 2 (two) times daily.     enalapril (VASOTEC) 20 MG tablet Take 1 tablet (20 mg total) by mouth daily. 90 tablet 3   FREESTYLE LITE test strip USE TO CHECK BLOOD GLUCOSE UP TO 3 TIMES DAILY. 100 strip 2    metFORMIN (GLUCOPHAGE) 500 MG tablet Take 1 tablet (500 mg total) by mouth 2 (two) times daily with a meal. 180 tablet 0   Multiple Vitamin (MULTIVITAMIN) tablet Take 1 tablet by mouth daily.     Omega-3 Fatty Acids (FISH OIL PO) Take 1,200 mg by mouth. Takes 1 tablet qd.     ondansetron (ZOFRAN) 4 MG tablet Take 1 tablet (4 mg total) by mouth every 8 (eight) hours as needed for nausea or vomiting. 5 tablet 0   Potassium Gluconate 550 MG TABS Take by mouth daily as needed.     triamterene-hydrochlorothiazide (MAXZIDE-25) 37.5-25 MG tablet Take 1 tablet by mouth every morning. 90 tablet 3   Turmeric (QC TUMERIC COMPLEX PO) Take by  mouth daily.     No facility-administered medications prior to visit.

## 2022-04-18 NOTE — Telephone Encounter (Signed)
Last visit 03/21/2022 Next visit 05/21/2022

## 2022-04-19 MED ORDER — AMLODIPINE BESYLATE 10 MG PO TABS: 10.0000 mg | ORAL_TABLET | Freq: Every day | ORAL | 3 refills | Status: DC

## 2022-04-19 MED ORDER — ENALAPRIL MALEATE 20 MG PO TABS: 20.0000 mg | ORAL_TABLET | Freq: Every day | ORAL | 3 refills | Status: DC

## 2022-04-20 ENCOUNTER — Telehealth: Payer: Self-pay | Admitting: Student in an Organized Health Care Education/Training Program

## 2022-04-20 NOTE — Telephone Encounter (Signed)
Forms have been placed in Dr. Gretta Began folder for review.

## 2022-04-23 NOTE — Telephone Encounter (Signed)
Noted  

## 2022-04-24 ENCOUNTER — Ambulatory Visit (INDEPENDENT_AMBULATORY_CARE_PROVIDER_SITE_OTHER): Payer: Medicare HMO | Admitting: Pulmonary Disease

## 2022-04-24 ENCOUNTER — Encounter: Payer: Self-pay | Admitting: Pulmonary Disease

## 2022-04-24 DIAGNOSIS — J849 Interstitial pulmonary disease, unspecified: Secondary | ICD-10-CM

## 2022-04-24 NOTE — Progress Notes (Signed)
   Interstitial Lung Disease Multidisciplinary Conference   Christopher Oconnell    MRN [id number]    DOB 1951/01/01  Primary Care Physician:Cable, Alyson Locket, NP  Referring Physician: Dr. Armando Reichert  Time of Conference: 7.30am- 8.30am Date of conference: 04/24/2022 Location of Conference: -  Virtual  Participating Pulmonary: Dr. Brand Males, MD,  Dr Marshell Garfinkel, MD Pathology:  Radiology: Dr Salvatore Marvel MD  Others:   Brief History:  He's a 72 year old male presenting for shortness of breath with a mild dry cough. He got very short of breath walking up a hill and his PCP got a CXR and a CT with the findings. History from the patient shows some muscle weakness (he had to give up archery because no longer has the strength to pull the arrow). He also reports a mold infestation in his house around three years ago that was professionally remediated. 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   I've sent a hypersensitivity, auto-immune and myositis workup (ANA with reflex to antibodies, ANCA, CCP, RF, MyoMarker 3, Hypersensitivity panel, CK, Aldolase). 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  PFT     No data to display          MDD discussion of CT scan    Date or time period of scan:  No HRCT CT Chest: 03/23/2022  Fibrosis with peripheral bronchiolectasis in anterior lung. Predominant ground glass opacities, sparring of lung bases. 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 No clear evidence of asbestosis   MDD Impression/Recs:  CT scan with alternate pattern per ATS criteria.  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  Due to uncertainty of diagnosis it would be reasonable to offer surgical lung biopsy if the patient can tolerate it.  Time Spent in preparation and discussion:  > 30 min   SIGNATURE   Christopher Salm  MD [facility] See Amion for pager  If no response to pager , please call [phone] until 7pm After 7:00 pm call Elink  [phone] 04/24/2022, 8:23 AM

## 2022-04-27 ENCOUNTER — Telehealth: Payer: Self-pay | Admitting: Student in an Organized Health Care Education/Training Program

## 2022-04-27 NOTE — Telephone Encounter (Signed)
Telephone call:  Spoke to the patient to explain the recommendations from the multi-disciplinary ILD board. Patient's wife is dealing with medical issues and he would like to be fully present for that. Discussed treatment options that include suppression, and I'm hesitant to start them without tissue biopsy. He will have his PFT's and schedule a follow up with me to discuss further.  Armando Reichert, MD [facility] 04/27/2022 2:59 PM

## 2022-04-30 ENCOUNTER — Ambulatory Visit (INDEPENDENT_AMBULATORY_CARE_PROVIDER_SITE_OTHER): Payer: Medicare HMO

## 2022-04-30 VITALS — Ht 69.0 in | Wt 202.0 lb

## 2022-04-30 DIAGNOSIS — Z0000 Encounter for general adult medical examination without abnormal findings: Secondary | ICD-10-CM | POA: Diagnosis not present

## 2022-04-30 NOTE — Patient Instructions (Signed)
Christopher Oconnell , Thank you for taking time to come for your Medicare Wellness Visit. I appreciate your ongoing commitment to your health goals. Please review the following plan we discussed and let me know if I can assist you in the future.   These are the goals we discussed:  Goals      Patient Stated     04/26/2020, I will maintain and continue medications as prescribed.      Patient Stated     Would like to maintain current routine     Patient Stated     Enjoy Life.        This is a list of the screening recommended for you and due dates:  Health Maintenance  Topic Date Due   Yearly kidney health urinalysis for diabetes  Never done   COVID-19 Vaccine (4 - 2023-24 season) 11/10/2021   Yearly kidney function blood test for diabetes  05/11/2022   Complete foot exam   05/11/2022   Eye exam for diabetics  07/07/2022   Hemoglobin A1C  09/19/2022   Medicare Annual Wellness Visit  05/01/2023   Colon Cancer Screening  05/01/2026   DTaP/Tdap/Td vaccine (2 - Td or Tdap) 11/21/2027   Pneumonia Vaccine  Completed   Flu Shot  Completed   Hepatitis C Screening: USPSTF Recommendation to screen - Ages 49-79 yo.  Completed   Zoster (Shingles) Vaccine  Completed   HPV Vaccine  Aged Out    Advanced directives: Copy is chart.  Conditions/risks identified: Aim for 30 minutes of exercise or brisk walking, 6-8 glasses of water.  Next appointment: Follow up in one year for your annual wellness visit. 05/02/2023 @ 8:00 via telephone.  Preventive Care 44 Years and Older, Male  Preventive care refers to lifestyle choices and visits with your health care provider that can promote health and wellness. What does preventive care include? A yearly physical exam. This is also called an annual well check. Dental exams once or twice a year. Routine eye exams. Ask your health care provider how often you should have your eyes checked. Personal lifestyle choices, including: Daily care of your teeth and  gums. Regular physical activity. Eating a healthy diet. Avoiding tobacco and drug use. Limiting alcohol use. Practicing safe sex. Taking low doses of aspirin every day. Taking vitamin and mineral supplements as recommended by your health care provider. What happens during an annual well check? The services and screenings done by your health care provider during your annual well check will depend on your age, overall health, lifestyle risk factors, and family history of disease. Counseling  Your health care provider may ask you questions about your: Alcohol use. Tobacco use. Drug use. Emotional well-being. Home and relationship well-being. Sexual activity. Eating habits. History of falls. Memory and ability to understand (cognition). Work and work Statistician. Screening  You may have the following tests or measurements: Height, weight, and BMI. Blood pressure. Lipid and cholesterol levels. These may be checked every 5 years, or more frequently if you are over 85 years old. Skin check. Lung cancer screening. You may have this screening every year starting at age 78 if you have a 30-pack-year history of smoking and currently smoke or have quit within the past 15 years. Fecal occult blood test (FOBT) of the stool. You may have this test every year starting at age 56. Flexible sigmoidoscopy or colonoscopy. You may have a sigmoidoscopy every 5 years or a colonoscopy every 10 years starting at age 15. Prostate cancer  screening. Recommendations will vary depending on your family history and other risks. Hepatitis C blood test. Hepatitis B blood test. Sexually transmitted disease (STD) testing. Diabetes screening. This is done by checking your blood sugar (glucose) after you have not eaten for a while (fasting). You may have this done every 1-3 years. Abdominal aortic aneurysm (AAA) screening. You may need this if you are a current or former smoker. Osteoporosis. You may be screened  starting at age 42 if you are at high risk. Talk with your health care provider about your test results, treatment options, and if necessary, the need for more tests. Vaccines  Your health care provider may recommend certain vaccines, such as: Influenza vaccine. This is recommended every year. Tetanus, diphtheria, and acellular pertussis (Tdap, Td) vaccine. You may need a Td booster every 10 years. Zoster vaccine. You may need this after age 8. Pneumococcal 13-valent conjugate (PCV13) vaccine. One dose is recommended after age 19. Pneumococcal polysaccharide (PPSV23) vaccine. One dose is recommended after age 74. Talk to your health care provider about which screenings and vaccines you need and how often you need them. This information is not intended to replace advice given to you by your health care provider. Make sure you discuss any questions you have with your health care provider. Document Released: 03/25/2015 Document Revised: 11/16/2015 Document Reviewed: 12/28/2014 Elsevier Interactive Patient Education  2017 [place] Prevention in the Home Falls can cause injuries. They can happen to people of all ages. There are many things you can do to make your home safe and to help prevent falls. What can I do on the outside of my home? Regularly fix the edges of walkways and driveways and fix any cracks. Remove anything that might make you trip as you walk through a door, such as a raised step or threshold. Trim any bushes or trees on the path to your home. Use bright outdoor lighting. Clear any walking paths of anything that might make someone trip, such as rocks or tools. Regularly check to see if handrails are loose or broken. Make sure that both sides of any steps have handrails. Any raised decks and porches should have guardrails on the edges. Have any leaves, snow, or ice cleared regularly. Use sand or salt on walking paths during winter. Clean up any spills in your garage  right away. This includes oil or grease spills. What can I do in the bathroom? Use night lights. Install grab bars by the toilet and in the tub and shower. Do not use towel bars as grab bars. Use non-skid mats or decals in the tub or shower. If you need to sit down in the shower, use a plastic, non-slip stool. Keep the floor dry. Clean up any water that spills on the floor as soon as it happens. Remove soap buildup in the tub or shower regularly. Attach bath mats securely with double-sided non-slip rug tape. Do not have throw rugs and other things on the floor that can make you trip. What can I do in the bedroom? Use night lights. Make sure that you have a light by your bed that is easy to reach. Do not use any sheets or blankets that are too big for your bed. They should not hang down onto the floor. Have a firm chair that has side arms. You can use this for support while you get dressed. Do not have throw rugs and other things on the floor that can make you trip. What can  I do in the kitchen? Clean up any spills right away. Avoid walking on wet floors. Keep items that you use a lot in easy-to-reach places. If you need to reach something above you, use a strong step stool that has a grab bar. Keep electrical cords out of the way. Do not use floor polish or wax that makes floors slippery. If you must use wax, use non-skid floor wax. Do not have throw rugs and other things on the floor that can make you trip. What can I do with my stairs? Do not leave any items on the stairs. Make sure that there are handrails on both sides of the stairs and use them. Fix handrails that are broken or loose. Make sure that handrails are as long as the stairways. Check any carpeting to make sure that it is firmly attached to the stairs. Fix any carpet that is loose or worn. Avoid having throw rugs at the top or bottom of the stairs. If you do have throw rugs, attach them to the floor with carpet tape. Make  sure that you have a light switch at the top of the stairs and the bottom of the stairs. If you do not have them, ask someone to add them for you. What else can I do to help prevent falls? Wear shoes that: Do not have high heels. Have rubber bottoms. Are comfortable and fit you well. Are closed at the toe. Do not wear sandals. If you use a stepladder: Make sure that it is fully opened. Do not climb a closed stepladder. Make sure that both sides of the stepladder are locked into place. Ask someone to hold it for you, if possible. Clearly mark and make sure that you can see: Any grab bars or handrails. First and last steps. Where the edge of each step is. Use tools that help you move around (mobility aids) if they are needed. These include: Canes. Walkers. Scooters. Crutches. Turn on the lights when you go into a dark area. Replace any light bulbs as soon as they burn out. Set up your furniture so you have a clear path. Avoid moving your furniture around. If any of your floors are uneven, fix them. If there are any pets around you, be aware of where they are. Review your medicines with your doctor. Some medicines can make you feel dizzy. This can increase your chance of falling. Ask your doctor what other things that you can do to help prevent falls. This information is not intended to replace advice given to you by your health care provider. Make sure you discuss any questions you have with your health care provider. Document Released: 12/23/2008 Document Revised: 08/04/2015 Document Reviewed: 04/02/2014 Elsevier Interactive Patient Education  2017 [employer].

## 2022-04-30 NOTE — Progress Notes (Signed)
I connected with  AKRAM MOBBS on 04/30/22 by a audio enabled telemedicine application and verified that I am speaking with the correct person using two identifiers.  Patient Location: Home  Provider Location: Home Office  I discussed the limitations of evaluation and management by telemedicine. The patient expressed understanding and agreed to proceed.  Subjective:   Christopher Oconnell is a 72 y.o. male who presents for Medicare Annual/Subsequent preventive examination.  Review of Systems     Cardiac Risk Factors include: advanced age (>40mn, >>68women);male gender;hypertension;diabetes mellitus     Objective:    Today's Vitals   04/30/22 0759  Weight: 202 lb (91.6 kg)  Height: 5' 9"$  (1.753 m)   Body mass index is 29.83 kg/m.     04/30/2022    8:12 AM 04/27/2021    8:22 AM 04/26/2020    8:12 AM  Advanced Directives  Does Patient Have a Medical Advance Directive? Yes Yes No  Type of AParamed[place]of [place] will [place] will   Does patient want to make changes to medical advance directive? No - Patient declined Yes (MAU/Ambulatory/Procedural Areas - Information given) No - Patient declined  Copy of [place] Chart? Yes - validated most recent copy scanned in chart (See row information)      Current Medications (verified) Outpatient Encounter Medications as of 04/30/2022  Medication Sig   amLODipine (NORVASC) 10 MG tablet Take 1 tablet (10 mg total) by mouth daily.   aspirin EC 81 MG tablet Take 81 mg by mouth daily.   atorvastatin (LIPITOR) 40 MG tablet TAKE 1 TABLET BY MOUTH EVERY DAY   CINNAMON PO Take 200 mg by mouth 2 (two) times daily.   enalapril (VASOTEC) 20 MG tablet Take 1 tablet (20 mg total) by mouth daily.   FREESTYLE LITE test strip USE TO CHECK BLOOD GLUCOSE UP TO 3 TIMES DAILY.   metFORMIN (GLUCOPHAGE) 500 MG tablet Take 1 tablet (500 mg total) by mouth 2 (two) times  daily with a meal.   Multiple Vitamin (MULTIVITAMIN) tablet Take 1 tablet by mouth daily.   Omega-3 Fatty Acids (FISH OIL PO) Take 1,200 mg by mouth. Takes 1 tablet qd.   Potassium Gluconate 550 MG TABS Take by mouth daily as needed.   triamterene-hydrochlorothiazide (MAXZIDE-25) 37.5-25 MG tablet Take 1 tablet by mouth every morning.   Turmeric (QC TUMERIC COMPLEX PO) Take by mouth daily.   ondansetron (ZOFRAN) 4 MG tablet Take 1 tablet (4 mg total) by mouth every 8 (eight) hours as needed for nausea or vomiting. (Patient not taking: Reported on 04/30/2022)   No facility-administered encounter medications on file as of 04/30/2022.    Allergies (verified) Patient has no known allergies.   History: Past Medical History:  Diagnosis Date   Arthritis    Asthma    Atypical mole 10/19/2020   R med bicep, mod to severe, exc 11/08/20   Basal cell carcinoma 11/05/2019   Right ear above crus. Nodular and infiltrative patterns. Excised 01/19/2020, margins free.   Coronary artery disease    Diabetes mellitus without complication ([facility]    Dysplastic nevus 11/08/2020   left med bicep at junction of L deltoid - moderate   Dysplastic nevus 11/08/2020   left med bicep at junction of L deltoid, moderate atypia   GERD (gastroesophageal reflux disease)    Hyperlipidemia    Hypertension    Past Surgical History:  Procedure Laterality Date   CORONARY  ANGIOPLASTY     No stents   NOSE SURGERY     SKIN CANCER EXCISION  01/2020   right ear    TONSILLECTOMY     Family History  Problem Relation Age of Onset   Dementia Mother    Coronary artery disease Father    Melanoma Father    Pancreatic cancer Father    Breast cancer Sister    Breast cancer Sister    Drug abuse Sister    Cancer Maternal Grandmother        unsure of the type   Heart attack Paternal Grandmother    Heart attack Paternal Grandfather    Social History   Socioeconomic History   Marital status: Married    Spouse name:  [name]   Number of children: 0   Years of education: [employer] school   Highest education level: Not on file  Occupational History   Not on file  Tobacco Use   Smoking status: Former    Packs/day: 3.50    Years: 30.00    Total pack years: 105.00    Types: Cigarettes    Quit date: 1993    Years since quitting: 31.1   Smokeless tobacco: Never  Vaping Use   Vaping Use: Never used  Substance and Sexual Activity   Alcohol use: Yes    Comment: 15 beers on the weekends   Drug use: Never   Sexual activity: Not Currently  Other Topics Concern   Not on file  Social History Narrative   07/30/19   From: the area   Living: with wife, Jackelyn Poling (1986, together since 1981)   Work: Retired, Dealer work in 2013      Family: sisters live nearby, and in-laws are in [place]      Enjoys: hunting, fishing, golf (occasionally)      Exercise: daily activities, yard work   Diet: meat - primarily venison, salads, fish, chicken - no red meat      Safety   Seat belts: Yes    Guns: Yes  and not currently secure   Safe in relationships: Yes    Social Determinants of Radio broadcast assistant Strain: Low Risk  (04/30/2022)   Overall Financial Resource Strain (CARDIA)    Difficulty of Paying Living Expenses: Not hard at all  Food Insecurity: No Food Insecurity (04/30/2022)   Hunger Vital Sign    Worried About Running Out of Food in the Last Year: Never true    [place] in the Last Year: Never true  Transportation Needs: No Transportation Needs (04/30/2022)   PRAPARE - Hydrologist (Medical): No    Lack of Transportation (Non-Medical): No  Physical Activity: Inactive (04/30/2022)   Exercise Vital Sign    Days of Exercise per Week: 0 days    Minutes of Exercise per Session: 0 min  Stress: No Stress Concern Present (04/30/2022)   [place]    Feeling of Stress : Not at all  Social Connections:  Moderately Integrated (04/30/2022)   Social Connection and Isolation Panel [NHANES]    Frequency of Communication with Friends and Family: Twice a week    Frequency of Social Gatherings with Friends and Family: Twice a week    Attends Religious Services: Never    Marine scientist or Organizations: Yes    Attends Archivist Meetings: 1 to 4 times per year    Marital Status:  Married    Tobacco Counseling Counseling given: Not Answered   Clinical Intake:  Pre-visit preparation completed: Yes  Pain : No/denies pain     Nutritional Risks: None Diabetes: Yes CBG done?: No Did pt. bring in CBG monitor from home?: No  How often do you need to have someone help you when you read instructions, pamphlets, or other written materials from your doctor or pharmacy?: 1 - Never  Diabetic?Nutrition Risk Assessment:  Has the patient had any N/V/D within the last 2 months?  No  Does the patient have any non-healing wounds?  No  Has the patient had any unintentional weight loss or weight gain?  No   Diabetes:  Is the patient diabetic?  Yes  If diabetic, was a CBG obtained today?  No  Did the patient bring in their glucometer from home?  No  How often do you monitor your CBG's? QOD.   Financial Strains and Diabetes Management:  Are you having any financial strains with the device, your supplies or your medication? No .  Does the patient want to be seen by Chronic Care Management for management of their diabetes?  No  Would the patient like to be referred to a Nutritionist or for Diabetic Management?  No   Diabetic Exams:  Diabetic Eye Exam: Completed 07/06/2021 Dr.King Diabetic Foot Exam: Completed 05/10/2021 PCP    Interpreter Needed?: No  Information entered by :: C.Davonne Jarnigan LPN   Activities of Daily Living    04/30/2022    8:13 AM  In your present state of health, do you have any difficulty performing the following activities:  Hearing? 0  Vision? 0   Difficulty concentrating or making decisions? 1  Comment Names  Walking or climbing stairs? 0  Dressing or bathing? 0  Doing errands, shopping? 0  Preparing Food and eating ? N  Using the Toilet? N  In the past six months, have you accidently leaked urine? N  Do you have problems with loss of bowel control? Y  Comment Occasional seepage followed by PCP  Managing your Medications? N  Managing your Finances? N  Housekeeping or managing your Housekeeping? N    Patient Care Team: Michela Pitcher, NP as PCP - General (Nurse Practitioner) End, Harrell Gave, MD as PCP - Cardiology (Cardiology)  Indicate any recent Medical Services you may have received from other than [facility] providers in the past year (date may be approximate).     Assessment:   This is a routine wellness examination for [place].  Hearing/Vision screen Hearing Screening - Comments:: No aids Vision Screening - Comments:: Wears Glasses - Dr.King  Dietary issues and exercise activities discussed: Current Exercise Habits: The patient does not participate in regular exercise at present, Exercise limited by: None identified   Goals Addressed             This Visit's Progress    Patient Stated       Enjoy Life.       Depression Screen    04/30/2022    8:09 AM 04/27/2021    8:25 AM 04/26/2020    8:15 AM 07/30/2019    8:49 AM  PHQ 2/9 Scores  PHQ - 2 Score 0 0 0 0  PHQ- 9 Score   0     Fall Risk    04/30/2022    8:03 AM 04/27/2021    8:24 AM 04/26/2020    8:14 AM  [place] in the past year? 0 0 0  Number falls in past yr: 0 0 0  Injury with Fall? 0 0 0  Risk for fall due to : No Fall Risks No Fall Risks Medication side effect  Follow up Falls prevention discussed;Falls evaluation completed Falls prevention discussed Falls evaluation completed;Falls prevention discussed    FALL RISK PREVENTION PERTAINING TO THE HOME:  Any stairs in or around the home? Yes  If so, are there any without handrails?  Yes  Home free of loose throw rugs in walkways, pet beds, electrical cords, etc? Yes  Adequate lighting in your home to reduce risk of falls? Yes   ASSISTIVE DEVICES UTILIZED TO PREVENT FALLS:  Life alert? No  Use of a cane, walker or w/c? No  Grab bars in the bathroom? Yes  Shower chair or bench in shower? No  Elevated toilet seat or a handicapped toilet? No    Cognitive Function:    04/26/2020    8:17 AM  MMSE - Mini Mental State Exam  Orientation to time 5  Orientation to Place 5  Registration 3  Attention/ Calculation 5  Recall 3  Language- repeat 1        04/30/2022    8:20 AM  6CIT Screen  What Year? 0 points  What month? 0 points  What time? 0 points  Count back from 20 0 points  Months in reverse 0 points  Repeat phrase 0 points  Total Score 0 points    Immunizations Immunization History  Administered Date(s) Administered   Fluad Quad(high Dose 65+) 12/15/2018, 12/29/2021   Influenza-Unspecified 12/14/2019, 01/05/2021   PFIZER(Purple Top)SARS-COV-2 Vaccination 04/27/2019, 05/18/2019, 05/17/2020   Pneumococcal Conjugate-13 09/21/2014   Pneumococcal Polysaccharide-23 09/16/2012, 05/10/2020   Tdap 11/20/2017   Zoster Recombinat (Shingrix) 07/24/2018, 09/29/2018    TDAP status: Up to date  Flu Vaccine status: Up to date  Pneumococcal vaccine status: Up to date  Covid-19 vaccine status: Declined, Education has been provided regarding the importance of this vaccine but patient still declined. Advised may receive this vaccine at local pharmacy or Health Dept.or vaccine clinic. Aware to provide a copy of the vaccination record if obtained from local pharmacy or Health Dept. Verbalized acceptance and understanding.  Qualifies for Shingles Vaccine? No   Zostavax completed Yes   Shingrix Completed?: Yes  Screening Tests Health Maintenance  Topic Date Due   Diabetic kidney evaluation - Urine ACR  Never done   COVID-19 Vaccine (4 - 2023-24 season)  11/10/2021   Diabetic kidney evaluation - eGFR measurement  05/11/2022   FOOT EXAM  05/11/2022   OPHTHALMOLOGY EXAM  07/07/2022   HEMOGLOBIN A1C  09/19/2022   Medicare Annual Wellness (AWV)  05/01/2023   COLONOSCOPY (Pts 45-52yr Insurance coverage will need to be confirmed)  05/01/2026   DTaP/Tdap/Td (2 - Td or Tdap) 11/21/2027   Pneumonia Vaccine 72 Years old  Completed   INFLUENZA VACCINE  Completed   Hepatitis C Screening  Completed   Zoster Vaccines- Shingrix  Completed   HPV VACCINES  Aged Out    Health Maintenance  Health Maintenance Due  Topic Date Due   Diabetic kidney evaluation - Urine ACR  Never done   COVID-19 Vaccine (4 - 2023-24 season) 11/10/2021   Diabetic kidney evaluation - eGFR measurement  05/11/2022    Colorectal cancer screening: Type of screening: Colonoscopy. Completed 05/01/2021. Repeat every 5 years  Lung Cancer Screening: (Low Dose CT Chest recommended if Age 221-80years, 30 pack-year currently smoking OR have quit w/in 15years.) does not qualify.  Lung Cancer Screening Referral: no  Additional Screening:  Hepatitis C Screening: does not qualify; Completed 11/06/2016  Vision Screening: Recommended annual ophthalmology exams for early detection of glaucoma and other disorders of the eye. Is the patient up to date with their annual eye exam?  Yes  Who is the provider or what is the name of the office in which the patient attends annual eye exams? Dr.King If pt is not established with a provider, would they like to be referred to a provider to establish care? No .   Dental Screening: Recommended annual dental exams for proper oral hygiene  Community Resource Referral / Chronic Care Management: CRR required this visit?  No   CCM required this visit?  No      Plan:     I have personally reviewed and noted the following in the patient's chart:   Medical and social history Use of alcohol, tobacco or illicit drugs  Current medications and  supplements including opioid prescriptions. Patient is not currently taking opioid prescriptions. Functional ability and status Nutritional status Physical activity Advanced directives List of other physicians Hospitalizations, surgeries, and ER visits in previous 12 months Vitals Screenings to include cognitive, depression, and falls Referrals and appointments  In addition, I have reviewed and discussed with patient certain preventive protocols, quality metrics, and best practice recommendations. A written personalized care plan for preventive services as well as general preventive health recommendations were provided to patient.     Lebron Conners, LPN   [id number]   Nurse Notes: none

## 2022-05-16 ENCOUNTER — Encounter: Payer: Medicare HMO | Admitting: Family Medicine

## 2022-05-21 ENCOUNTER — Ambulatory Visit (INDEPENDENT_AMBULATORY_CARE_PROVIDER_SITE_OTHER): Payer: Medicare HMO | Admitting: Nurse Practitioner

## 2022-05-21 ENCOUNTER — Encounter: Payer: Self-pay | Admitting: Nurse Practitioner

## 2022-05-21 VITALS — BP 136/60 | HR 78 | Temp 98.8°F | Resp 16 | Ht 67.5 in | Wt 199.1 lb

## 2022-05-21 DIAGNOSIS — Z125 Encounter for screening for malignant neoplasm of prostate: Secondary | ICD-10-CM

## 2022-05-21 DIAGNOSIS — E1149 Type 2 diabetes mellitus with other diabetic neurological complication: Secondary | ICD-10-CM | POA: Diagnosis not present

## 2022-05-21 DIAGNOSIS — E1169 Type 2 diabetes mellitus with other specified complication: Secondary | ICD-10-CM | POA: Diagnosis not present

## 2022-05-21 DIAGNOSIS — E785 Hyperlipidemia, unspecified: Secondary | ICD-10-CM | POA: Diagnosis not present

## 2022-05-21 DIAGNOSIS — I10 Essential (primary) hypertension: Secondary | ICD-10-CM | POA: Diagnosis not present

## 2022-05-21 DIAGNOSIS — Z0000 Encounter for general adult medical examination without abnormal findings: Secondary | ICD-10-CM | POA: Diagnosis not present

## 2022-05-21 DIAGNOSIS — R0609 Other forms of dyspnea: Secondary | ICD-10-CM | POA: Diagnosis not present

## 2022-05-21 MED ORDER — METFORMIN HCL 500 MG PO TABS: 500.0000 mg | ORAL_TABLET | Freq: Two times a day (BID) | ORAL | 0 refills | Status: DC

## 2022-05-21 NOTE — Progress Notes (Signed)
Established Patient Office Visit  Subjective   Patient ID: Christopher Oconnell, male    DOB: [birth year]  Age: 72 y.o. MRN: [id number]  Chief Complaint  Patient presents with   Annual Exam   Labs Only    HPI  DM2 patient is on metformin 500 mg BID checks his sugar daily to several times a day No hypoglycemic events  HTN: he is on maxzide, amlodipiem and enalapril  for complete physical and follow up of chronic conditions.  Immunizations: -Tetanus: Completed in 2019 -Influenza: Completed this season -Shingles: Completed Shingrix series -Pneumonia: Completed   Diet: Fair diet. States that he is doing a decent breakfast and something for lunch and snack the rest of the day. States coffee and water flavoring.  Exercise: No regular exercise.  Eye exam: Completes annually. Has appt in june Dental exam: Completes semi-annually    Colonoscopy: Completed in 05/01/2021, repeat in 5 years Lung Cancer Screening: Completed in   PSA: Due  Sleep: goes to bed aroun 9 and does not sleep well. He tosses and turns. Gets up with daylight. Does not feel rested often. Does sometimes. Does snore  States that he will take ibuprofen and helps him rest. Has tried melationin without relief. No sleep study done in the past       Review of Systems  Constitutional:  Negative for chills and fever.  Respiratory:  Positive for shortness of breath.   Cardiovascular:  Negative for chest pain and leg swelling.  Gastrointestinal:  Negative for abdominal pain, blood in stool, constipation, diarrhea, nausea and vomiting.       BM daily   Genitourinary:  Negative for dysuria and hematuria.  Neurological:  Positive for tingling. Negative for headaches.  Psychiatric/Behavioral:  Negative for hallucinations and suicidal ideas.       Objective:     BP 136/60   Pulse 78   Temp 98.8 F (37.1 C)   Resp 16   Ht 5' 7.5" (1.715 m)   Wt 199 lb 2 oz (90.3 kg)   SpO2 98%   BMI 30.73 kg/m     Physical Exam Vitals and nursing note reviewed.  Constitutional:      Appearance: Normal appearance.  HENT:     Right Ear: Tympanic membrane, ear canal and external ear normal.     Left Ear: Tympanic membrane, ear canal and external ear normal.     Mouth/Throat:     Mouth: Mucous membranes are moist.     Pharynx: Oropharynx is clear.  Eyes:     Extraocular Movements: Extraocular movements intact.     Pupils: Pupils are equal, round, and reactive to light.  Cardiovascular:     Rate and Rhythm: Normal rate and regular rhythm.     Pulses: Normal pulses.     Heart sounds: Normal heart sounds.  Pulmonary:     Effort: Pulmonary effort is normal.     Breath sounds: Normal breath sounds.  Abdominal:     General: Bowel sounds are normal. There is no distension.     Palpations: There is no mass.     Tenderness: There is no abdominal tenderness.     Hernia: No hernia is present.  Musculoskeletal:     Right lower leg: No edema.     Left lower leg: No edema.  Lymphadenopathy:     Cervical: No cervical adenopathy.  Skin:    General: Skin is warm.  Neurological:     General: No focal deficit present.  Mental Status: He is alert.     Deep Tendon Reflexes:     Reflex Scores:      Bicep reflexes are 2+ on the right side and 2+ on the left side.      Patellar reflexes are 2+ on the right side and 2+ on the left side.    Comments: Bilateral upper and lower extremity strength 5/5  Psychiatric:        Mood and Affect: Mood normal.        Behavior: Behavior normal.        Thought Content: Thought content normal.        Judgment: Judgment normal.      No results found for any visits on 05/21/22.    The 10-year ASCVD risk score (Arnett DK, et al., 2019) is: 44.6%    Assessment & Plan:   Problem List Items Addressed This Visit       Cardiovascular and Mediastinum   Essential hypertension    Patient is currently on Maxide, amlodipine, enalapril.  Blood pressure within  normal limits.  He does not check blood pressure at home.  Continue medication as prescribed      Relevant Orders   CBC   Comprehensive metabolic panel   Hemoglobin A1c   Lipid panel     Endocrine   Type 2 diabetes mellitus with neurological complications ([place])    Patient currently maintained on metformin 500 mg twice daily.  Pending A1c today continue medication as prescribed      Relevant Medications   metFORMIN (GLUCOPHAGE) 500 MG tablet   Other Relevant Orders   CBC   Comprehensive metabolic panel   Hemoglobin A1c   Lipid panel   Microalbumin / creatinine urine ratio   Hyperlipidemia associated with type 2 diabetes mellitus ([place])    Patient currently maintained on atorvastatin 40 mg daily.  Pending lipid panel.  Continue medication as prescribed      Relevant Medications   metFORMIN (GLUCOPHAGE) 500 MG tablet   Other Relevant Orders   CBC   Comprehensive metabolic panel   Lipid panel     Other   DOE (dyspnea on exertion)    Patient was seen by pulmonology and recommended to have a lung biopsy.  He is still caring for his wife who is still currently doing therapy.  He would like a second opinion.  Referral sent to [facility] pulmonology for second opinion dealing with ILD      Relevant Orders   Ambulatory referral to Pulmonology   Preventative health care - Primary    Discussed age-appropriate immunizations and screening exams.  Patient is up-to-date on all of his vaccines.  Patient is not due for CRC screening but states he does not want any more colonoscopies.  Will draw PSA for prostate cancer screening today.  Patient was given information at discharge about preventative healthcare maintenance with anticipatory guidance.  Patient states personal, family, surgical, and social history was reviewed.      Other Visit Diagnoses     Screening for prostate cancer       Relevant Orders   PSA, Medicare       Return in about 6 months (around 11/21/2022) for DM recheck.     Romilda Garret, NP

## 2022-05-21 NOTE — Assessment & Plan Note (Signed)
Patient is currently on Maxide, amlodipine, enalapril.  Blood pressure within normal limits.  He does not check blood pressure at home.  Continue medication as prescribed

## 2022-05-21 NOTE — Assessment & Plan Note (Signed)
Patient currently maintained on metformin 500 mg twice daily.  Pending A1c today continue medication as prescribed

## 2022-05-21 NOTE — Patient Instructions (Addendum)
Nice to see you today You can check your sugar weekly or if you feel off Follow up with me in 6 months, sooner if you need me I have placed a referral for a second opinion on your lungs

## 2022-05-21 NOTE — Assessment & Plan Note (Signed)
Discussed age-appropriate immunizations and screening exams.  Patient is up-to-date on all of his vaccines.  Patient is not due for CRC screening but states he does not want any more colonoscopies.  Will draw PSA for prostate cancer screening today.  Patient was given information at discharge about preventative healthcare maintenance with anticipatory guidance.  Patient states personal, family, surgical, and social history was reviewed.

## 2022-05-21 NOTE — Assessment & Plan Note (Signed)
Patient currently maintained on atorvastatin 40 mg daily.  Pending lipid panel.  Continue medication as prescribed

## 2022-05-21 NOTE — Assessment & Plan Note (Signed)
Patient was seen by pulmonology and recommended to have a lung biopsy.  He is still caring for his wife who is still currently doing therapy.  He would like a second opinion.  Referral sent to [facility] pulmonology for second opinion dealing with ILD

## 2022-05-22 LAB — CBC
HCT: 46.3 % (ref 39.0–52.0)
Hemoglobin: 15.7 g/dL (ref 13.0–17.0)
MCHC: 34 g/dL (ref 30.0–36.0)
MCV: 96.6 fl (ref 78.0–100.0)
Platelets: 349 10*3/uL (ref 150.0–400.0)
RBC: 4.8 Mil/uL (ref 4.22–5.81)
RDW: 12.7 % (ref 11.5–15.5)
WBC: 9.4 10*3/uL (ref 4.0–10.5)

## 2022-05-22 LAB — COMPREHENSIVE METABOLIC PANEL
ALT: 15 U/L (ref 0–53)
AST: 16 U/L (ref 0–37)
Albumin: 4.5 g/dL (ref 3.5–5.2)
Alkaline Phosphatase: 74 U/L (ref 39–117)
BUN: 14 mg/dL (ref 6–23)
CO2: 26 mEq/L (ref 19–32)
Calcium: 9.7 mg/dL (ref 8.4–10.5)
Chloride: 99 mEq/L (ref 96–112)
Creatinine, Ser: 0.8 mg/dL (ref 0.40–1.50)
GFR: 88.08 mL/min (ref >60.00)
Glucose, Bld: 82 mg/dL (ref 70–99)
Potassium: 4.1 mEq/L (ref 3.5–5.1)
Sodium: 136 mEq/L (ref 135–145)
Total Bilirubin: 0.6 mg/dL (ref 0.2–1.2)
Total Protein: 6.9 g/dL (ref 6.0–8.3)

## 2022-05-22 LAB — MICROALBUMIN / CREATININE URINE RATIO
Creatinine,U: 44.2 mg/dL
Microalb Creat Ratio: 4.7 mg/g (ref 0.0–30.0)
Microalb, Ur: 2.1 mg/dL — ABNORMAL HIGH (ref 0.0–1.9)

## 2022-05-22 LAB — PSA, MEDICARE: PSA: 0.73 ng/ml (ref 0.10–4.00)

## 2022-05-22 LAB — LIPID PANEL
Cholesterol: 162 mg/dL (ref 0–200)
HDL: 42.9 mg/dL (ref >39.00)
NonHDL: 119.5
Total CHOL/HDL Ratio: 4
Triglycerides: 282 mg/dL — ABNORMAL HIGH (ref 0.0–149.0)
VLDL: 56.4 mg/dL — ABNORMAL HIGH (ref 0.0–40.0)

## 2022-05-22 LAB — LDL CHOLESTEROL, DIRECT: Direct LDL: 90 mg/dL

## 2022-05-22 LAB — HEMOGLOBIN A1C: Hgb A1c MFr Bld: 6.5 % (ref 4.6–6.5)

## 2022-06-22 ENCOUNTER — Other Ambulatory Visit: Payer: Self-pay

## 2022-06-22 MED ORDER — TRIAMTERENE-HCTZ 37.5-25 MG PO TABS: 1.0000 | ORAL_TABLET | Freq: Every morning | ORAL | 0 refills | Status: DC

## 2022-06-22 NOTE — Telephone Encounter (Signed)
triamterene-hydrochlorothiazide (MAXZIDE-25) 37.5-25 MG tablet  LOV 05/21/2022 NOV Per Matt come back in 6 months

## 2022-06-25 ENCOUNTER — Encounter: Payer: Self-pay | Admitting: Nurse Practitioner

## 2022-06-25 NOTE — Telephone Encounter (Signed)
Pt called stating he spoke to Dr. Karna Christmas office & was told they haven't received the records from the first pulmonary physician the pt saw. Dr. Karna Christmas is requesting those records. Fax # is [phone]. Call back # [phone]

## 2022-06-25 NOTE — Telephone Encounter (Signed)
I have referred patient to pulmonary. It shows it is assigned. Please see my chart message

## 2022-07-09 ENCOUNTER — Other Ambulatory Visit: Payer: Self-pay

## 2022-07-09 DIAGNOSIS — E785 Hyperlipidemia, unspecified: Secondary | ICD-10-CM

## 2022-07-09 MED ORDER — ATORVASTATIN CALCIUM 40 MG PO TABS: 40.0000 mg | ORAL_TABLET | Freq: Every day | ORAL | 3 refills | Status: DC

## 2022-07-09 NOTE — Telephone Encounter (Signed)
atorvastatin (LIPITOR) 40 MG tablet LOV 05/21/2022 NOV 11/21/2022

## 2022-07-11 DIAGNOSIS — H2513 Age-related nuclear cataract, bilateral: Secondary | ICD-10-CM | POA: Diagnosis not present

## 2022-07-11 DIAGNOSIS — E119 Type 2 diabetes mellitus without complications: Secondary | ICD-10-CM | POA: Diagnosis not present

## 2022-07-11 DIAGNOSIS — H524 Presbyopia: Secondary | ICD-10-CM | POA: Diagnosis not present

## 2022-07-11 DIAGNOSIS — J849 Interstitial pulmonary disease, unspecified: Secondary | ICD-10-CM | POA: Diagnosis not present

## 2022-07-11 LAB — HM DIABETES EYE EXAM

## 2022-07-12 ENCOUNTER — Other Ambulatory Visit: Payer: Self-pay | Admitting: Nurse Practitioner

## 2022-07-13 NOTE — Telephone Encounter (Signed)
metFORMIN (GLUCOPHAGE) 500 MG tablet  LR- 05/21/22 (180 tabs/ no refills) LV- 05/21/22 NV- 11/21/22

## 2022-08-30 ENCOUNTER — Ambulatory Visit: Payer: Medicare HMO | Attending: Internal Medicine | Admitting: Internal Medicine

## 2022-08-30 ENCOUNTER — Encounter: Payer: Self-pay | Admitting: Internal Medicine

## 2022-08-30 VITALS — BP 142/66 | HR 65 | Ht 68.0 in | Wt 203.0 lb

## 2022-08-30 DIAGNOSIS — I25118 Atherosclerotic heart disease of native coronary artery with other forms of angina pectoris: Secondary | ICD-10-CM

## 2022-08-30 DIAGNOSIS — E785 Hyperlipidemia, unspecified: Secondary | ICD-10-CM

## 2022-08-30 DIAGNOSIS — J849 Interstitial pulmonary disease, unspecified: Secondary | ICD-10-CM | POA: Insufficient documentation

## 2022-08-30 DIAGNOSIS — Z7984 Long term (current) use of oral hypoglycemic drugs: Secondary | ICD-10-CM | POA: Diagnosis not present

## 2022-08-30 DIAGNOSIS — E1169 Type 2 diabetes mellitus with other specified complication: Secondary | ICD-10-CM

## 2022-08-30 DIAGNOSIS — I10 Essential (primary) hypertension: Secondary | ICD-10-CM | POA: Diagnosis not present

## 2022-08-30 MED ORDER — ISOSORBIDE MONONITRATE ER 30 MG PO TB24: 30.0000 mg | ORAL_TABLET | Freq: Every day | ORAL | 3 refills | Status: DC

## 2022-08-30 MED ORDER — NITROGLYCERIN 0.4 MG SL SUBL: 0.4000 mg | SUBLINGUAL_TABLET | SUBLINGUAL | 3 refills | Status: DC | PRN

## 2022-08-30 NOTE — Patient Instructions (Signed)
Medication Instructions:  Your physician recommends the following medication changes.  START TAKING: Imdur 30 mg by mouth daily A prescription has been sent in for Nitroglycerin.  If you have chest pain that doesn't relieve quickly, place one tablet under your tongue and allow it to dissolve.  If no relief after 5 minutes, you may take another pill.  If no relief after 5 minutes, you may take a 3rd dose but you need to call 911 and report to ER immediately.  *If you need a refill on your cardiac medications before your next appointment, please call your pharmacy*   Lab Work: None ordered today   Testing/Procedures: None ordered today   Follow-Up: At [facility], you and your health needs are our priority.  As part of our continuing mission to provide you with exceptional heart care, we have created designated Provider Care Teams.  These Care Teams include your primary Cardiologist (physician) and Advanced Practice Providers (APPs -  Physician Assistants and Nurse Practitioners) who all work together to provide you with the care you need, when you need it.  We recommend signing up for the patient portal called "MyChart".  Sign up information is provided on this After Visit Summary.  MyChart is used to connect with patients for Virtual Visits (Telemedicine).  Patients are able to view lab/test results, encounter notes, upcoming appointments, etc.  Non-urgent messages can be sent to your provider as well.   To learn more about what you can do with MyChart, go to [email or web address].    Your next appointment:   4 month(s)  Provider:   You may see Yvonne Kendall, MD or one of the following Advanced Practice Providers on your designated Care Team:   Nicolasa Ducking, NP Eula Listen, PA-C Cadence Fransico Michael, PA-C Charlsie Quest, NP

## 2022-08-30 NOTE — Progress Notes (Signed)
Follow-up Outpatient Visit Date: 08/30/2022  Primary Care Provider: Eden Emms, NP [address] [place] [address]  Chief Complaint: Chest tightness and cough  HPI:  [name] is a 72 y.o. male with history of coronary artery disease status post angioplasty to D1 in 1993, stroke, hypertension, hyperlipidemia, type 2 diabetes mellitus, and recently diagnosed interstitial lung disease, who presents for follow-up of coronary artery disease.  I last saw him a year ago, which time he was doing well from a heart standpoint.  He was recently diagnosed with interstitial lung disease.  Most recent evaluation by Dr. Karna Christmas last month was suspicious for smoking-related lung injury with fibrotic changes and possible recurrent microaspiration's in the context of alcohol abuse and lifelong history of asthma.  Today, [name] reports that he has been having regular episodes of chest tightness with modest activity that began around November or December.  This is often associated with a mild headache.  Chest discomfort is similar to what he experienced prior to his angioplasty over 30 years ago.  He does not have any symptoms with rest or light activities around the house.  He was subsequently diagnosed with interstitial lung disease and has seen both Drs. Dgayli and Aleskerov for evaluation of this.  He was started on Trelegy and feels like this is helping somewhat.  He has mild exertional dyspnea that has been present for the last 2 to 3 years and seems fairly stable.  He also has intermittent cough that has been much better since beginning Trelegy.  --------------------------------------------------------------------------------------------------  Past Medical History:  Diagnosis Date   Arthritis    Asthma    Atypical mole 10/19/2020   R med bicep, mod to severe, exc 11/08/20   Basal cell carcinoma 11/05/2019   Right ear above crus. Nodular and infiltrative patterns. Excised  01/19/2020, margins free.   Coronary artery disease    Diabetes mellitus without complication (HCC)    Dysplastic nevus 11/08/2020   left med bicep at junction of L deltoid - moderate   Dysplastic nevus 11/08/2020   left med bicep at junction of L deltoid, moderate atypia   GERD (gastroesophageal reflux disease)    Hyperlipidemia    Hypertension    Past Surgical History:  Procedure Laterality Date   CORONARY ANGIOPLASTY     No stents   NOSE SURGERY     SKIN CANCER EXCISION  01/2020   right ear    TONSILLECTOMY      Current Meds  Medication Sig   amLODipine (NORVASC) 10 MG tablet Take 1 tablet (10 mg total) by mouth daily.   aspirin EC 81 MG tablet Take 81 mg by mouth daily.   atorvastatin (LIPITOR) 40 MG tablet Take 1 tablet (40 mg total) by mouth daily.   CINNAMON PO Take 200 mg by mouth 2 (two) times daily.   enalapril (VASOTEC) 20 MG tablet Take 1 tablet (20 mg total) by mouth daily.   FREESTYLE LITE test strip USE TO CHECK BLOOD GLUCOSE UP TO 3 TIMES DAILY.   metFORMIN (GLUCOPHAGE) 500 MG tablet TAKE 1 TABLET BY MOUTH 2 TIMES DAILY WITH A MEAL.   Multiple Vitamin (MULTIVITAMIN) tablet Take 1 tablet by mouth daily.   Omega-3 Fatty Acids (FISH OIL PO) Take 1,200 mg by mouth. Takes 1 tablet qd.   pantoprazole (PROTONIX) 40 MG tablet Take 40 mg by mouth daily as needed.   Potassium Gluconate 550 MG TABS Take by mouth daily as needed.  predniSONE (DELTASONE) 5 MG tablet Take 5 mg by mouth daily with breakfast.   TRELEGY ELLIPTA 200-62.5-25 MCG/ACT AEPB Inhale 1 puff into the lungs daily.   triamterene-hydrochlorothiazide (MAXZIDE-25) 37.5-25 MG tablet Take 1 tablet by mouth every morning.   Turmeric (QC TUMERIC COMPLEX PO) Take by mouth daily.    Allergies: Patient has no known allergies.  Social History   Tobacco Use   Smoking status: Former    Packs/day: 3.50    Years: 30.00    Additional pack years: 0.00    Total pack years: 105.00    Types: Cigarettes    Quit  date: 32    Years since quitting: 31.4   Smokeless tobacco: Never  Vaping Use   Vaping Use: Never used  Substance Use Topics   Alcohol use: Not Currently    Comment: 15 beers on the weekends   Drug use: Never    Family History  Problem Relation Age of Onset   Dementia Mother    Coronary artery disease Father    Melanoma Father    Pancreatic cancer Father    Breast cancer Sister    Breast cancer Sister    Drug abuse Sister    Cancer Maternal Grandmother        unsure of the type   Heart attack Paternal Grandmother    Heart attack Paternal Grandfather     Review of Systems: A 12-system review of systems was performed and was negative except as noted in the HPI.  --------------------------------------------------------------------------------------------------  Physical Exam: BP 136/74 (BP Location: Left Arm, Patient Position: Sitting, Cuff Size: Normal)   Pulse 65   Ht 5\' 8"  (1.727 m)   Wt 203 lb (92.1 kg)   SpO2 98%   BMI 30.87 kg/m  Repeat BP: 142/66  General:  NAD. Neck: No JVD or HJR. Lungs: Mildly diminished breath sounds throughout without wheezes or crackles. Heart: Regular rate and rhythm without murmurs, rubs, or gallops. Abdomen: Soft, nontender, nondistended. Extremities: No lower extremity edema.  EKG: Normal sinus rhythm with borderline LVH.  No significant abnormalities or changes from prior tracing on 08/24/2021.  Lab Results  Component Value Date   WBC 9.4 05/21/2022   HGB 15.7 05/21/2022   HCT 46.3 05/21/2022   MCV 96.6 05/21/2022   PLT 349.0 05/21/2022    Lab Results  Component Value Date   NA 136 05/21/2022   K 4.1 05/21/2022   CL 99 05/21/2022   CO2 26 05/21/2022   BUN 14 05/21/2022   CREATININE 0.80 05/21/2022   GLUCOSE 82 05/21/2022   ALT 15 05/21/2022    Lab Results  Component Value Date   CHOL 162 05/21/2022   HDL 42.90 05/21/2022   LDLCALC 72 05/10/2021   LDLDIRECT 90.0 05/21/2022   TRIG 282.0 (H) 05/21/2022    CHOLHDL 4 05/21/2022    --------------------------------------------------------------------------------------------------  ASSESSMENT AND PLAN: Coronary artery disease with stable angina: [name] reports exertional chest pain that began 6 to 7 months ago consistent with CCS class II angina.  However, he is convinced that his interstitial lung disease is responsible for this, though it is notable that he had similar chest discomfort leading up to his PTCA in 1993.  I have also reviewed his most recent chest CT, which in addition to fibrotic lung changes also shows extensive coronary artery calcification.  I have recommended ischemia testing with pharmacologic myocardial perfusion stress test or cardiac catheterization, as extensive coronary artery calcification would likely limit the utility of coronary CTA.  WMr. Christopher Oconnell does not wish to undergo any additional testing at this time despite my recommendation.  We have agreed to add isosorbide mononitrate 30 mg daily to see if this offers any relief.  I have also provided him with a prescription for sublingual nitroglycerin to be taken as needed.  Continue secondary prevention with aspirin and atorvastatin as well as antianginal and blood pressure control with amlodipine.  I advised him to contact us or seek immediate medical attention for worsening chest pain.  Hypertension: Blood pressure suboptimally controlled today.  Continue current regimen of amlodipine, enalapril, and triamterene HCTZ.  Add isosorbide mononitrate 30 mg daily for antianginal therapy and improved blood pressure control.  Hyperlipidemia associated with type 2 diabetes mellitus: LDL suboptimal on last check in March at 90 (goal less than 70).  Continue atorvastatin 40 mg daily for now; readdress escalation of atorvastatin at our next follow-up visit.  Interstitial lung disease: Ongoing management per pulmonology.  Follow-up: Return to clinic in 4 months (patient did not  want to be seen any sooner than this).  Yvonne Kendall, MD 08/30/2022 8:58 AM

## 2022-09-06 ENCOUNTER — Ambulatory Visit: Payer: Medicare HMO | Admitting: Dermatology

## 2022-09-06 VITALS — BP 122/66 | HR 72

## 2022-09-06 DIAGNOSIS — W908XXA Exposure to other nonionizing radiation, initial encounter: Secondary | ICD-10-CM

## 2022-09-06 DIAGNOSIS — L711 Rhinophyma: Secondary | ICD-10-CM

## 2022-09-06 DIAGNOSIS — L814 Other melanin hyperpigmentation: Secondary | ICD-10-CM

## 2022-09-06 DIAGNOSIS — L719 Rosacea, unspecified: Secondary | ICD-10-CM | POA: Diagnosis not present

## 2022-09-06 DIAGNOSIS — L570 Actinic keratosis: Secondary | ICD-10-CM

## 2022-09-06 DIAGNOSIS — L821 Other seborrheic keratosis: Secondary | ICD-10-CM

## 2022-09-06 DIAGNOSIS — Z1283 Encounter for screening for malignant neoplasm of skin: Secondary | ICD-10-CM | POA: Diagnosis not present

## 2022-09-06 DIAGNOSIS — L578 Other skin changes due to chronic exposure to nonionizing radiation: Secondary | ICD-10-CM

## 2022-09-06 DIAGNOSIS — Z85828 Personal history of other malignant neoplasm of skin: Secondary | ICD-10-CM

## 2022-09-06 DIAGNOSIS — Z7189 Other specified counseling: Secondary | ICD-10-CM

## 2022-09-06 DIAGNOSIS — B079 Viral wart, unspecified: Secondary | ICD-10-CM

## 2022-09-06 DIAGNOSIS — D229 Melanocytic nevi, unspecified: Secondary | ICD-10-CM | POA: Diagnosis not present

## 2022-09-06 DIAGNOSIS — L820 Inflamed seborrheic keratosis: Secondary | ICD-10-CM | POA: Diagnosis not present

## 2022-09-06 DIAGNOSIS — Z86018 Personal history of other benign neoplasm: Secondary | ICD-10-CM

## 2022-09-06 NOTE — Patient Instructions (Addendum)
Actinic keratoses are precancerous spots that appear secondary to cumulative UV radiation exposure/sun exposure over time. They are chronic with expected duration over 1 year. A portion of actinic keratoses will progress to squamous cell carcinoma of the skin. It is not possible to reliably predict which spots will progress to skin cancer and so treatment is recommended to prevent development of skin cancer.  Recommend daily broad spectrum sunscreen SPF 30+ to sun-exposed areas, reapply every 2 hours as needed.  Recommend staying in the shade or wearing long sleeves, sun glasses (UVA+UVB protection) and wide brim hats (4-inch brim around the entire circumference of the hat). Call for new or changing lesions.   Cryotherapy Aftercare  Wash gently with soap and water everyday.   Apply Vaseline and Band-Aid daily until healed.    Seborrheic Keratosis  What causes seborrheic keratoses? Seborrheic keratoses are harmless, common skin growths that first appear during adult life.  As time goes by, more growths appear.  Some people may develop a large number of them.  Seborrheic keratoses appear on both covered and uncovered body parts.  They are not caused by sunlight.  The tendency to develop seborrheic keratoses can be inherited.  They vary in color from skin-colored to gray, brown, or even black.  They can be either smooth or have a rough, warty surface.   Seborrheic keratoses are superficial and look as if they were stuck on the skin.  Under the microscope this type of keratosis looks like layers upon layers of skin.  That is why at times the top layer may seem to fall off, but the rest of the growth remains and re-grows.    Treatment Seborrheic keratoses do not need to be treated, but can easily be removed in the office.  Seborrheic keratoses often cause symptoms when they rub on clothing or jewelry.  Lesions can be in the way of shaving.  If they become inflamed, they can cause itching, soreness, or  burning.  Removal of a seborrheic keratosis can be accomplished by freezing, burning, or surgery. If any spot bleeds, scabs, or grows rapidly, please return to have it checked, as these can be an indication of a skin cancer.  Melanoma ABCDEs  Melanoma is the most dangerous type of skin cancer, and is the leading cause of death from skin disease.  You are more likely to develop melanoma if you: Have light-colored skin, light-colored eyes, or red or blond hair Spend a lot of time in the sun Tan regularly, either outdoors or in a tanning bed Have had blistering sunburns, especially during childhood Have a close family member who has had a melanoma Have atypical moles or large birthmarks  Early detection of melanoma is key since treatment is typically straightforward and cure rates are extremely high if we catch it early.   The first sign of melanoma is often a change in a mole or a new dark spot.  The ABCDE system is a way of remembering the signs of melanoma.  A for asymmetry:  The two halves do not match. B for border:  The edges of the growth are irregular. C for color:  A mixture of colors are present instead of an even brown color. D for diameter:  Melanomas are usually (but not always) greater than 6mm - the size of a pencil eraser. E for evolution:  The spot keeps changing in size, shape, and color.  Please check your skin once per month between visits. You can use a small   mirror in front and a large mirror behind you to keep an eye on the back side or your body.   If you see any new or changing lesions before your next follow-up, please call to schedule a visit.  Please continue daily skin protection including broad spectrum sunscreen SPF 30+ to sun-exposed areas, reapplying every 2 hours as needed when you're outdoors.   Staying in the shade or wearing long sleeves, sun glasses (UVA+UVB protection) and wide brim hats (4-inch brim around the entire circumference of the hat) are also  recommended for sun protection.    Due to recent changes in healthcare laws, you may see results of your pathology and/or laboratory studies on MyChart before the doctors have had a chance to review them. We understand that in some cases there may be results that are confusing or concerning to you. Please understand that not all results are received at the same time and often the doctors may need to interpret multiple results in order to provide you with the best plan of care or course of treatment. Therefore, we ask that you please give us 2 business days to thoroughly review all your results before contacting the office for clarification. Should we see a critical lab result, you will be contacted sooner.   If You Need Anything After Your Visit  If you have any questions or concerns for your doctor, please call our main line at [phone] and press option 4 to reach your doctor's medical assistant. If no one answers, please leave a voicemail as directed and we will return your call as soon as possible. Messages left after 4 pm will be answered the following business day.   You may also send us a message via MyChart. We typically respond to MyChart messages within 1-2 business days.  For prescription refills, please ask your pharmacy to contact our office. Our fax number is [phone].  If you have an urgent issue when the clinic is closed that cannot wait until the next business day, you can page your doctor at the number below.    Please note that while we do our best to be available for urgent issues outside of office hours, we are not available 24/7.   If you have an urgent issue and are unable to reach us, you may choose to seek medical care at your doctor's office, retail clinic, urgent care center, or emergency room.  If you have a medical emergency, please immediately call 911 or go to the emergency department.  Pager Numbers  - Dr. Kowalski: [phone]  - Dr. Moye:  [phone]  - Dr. Stewart: [phone]  In the event of inclement weather, please call our main line at [phone] for an update on the status of any delays or closures.  Dermatology Medication Tips: Please keep the boxes that topical medications come in in order to help keep track of the instructions about where and how to use these. Pharmacies typically print the medication instructions only on the boxes and not directly on the medication tubes.   If your medication is too expensive, please contact our office at [phone] option 4 or send us a message through MyChart.   We are unable to tell what your co-pay for medications will be in advance as this is different depending on your insurance coverage. However, we may be able to find a substitute medication at lower cost or fill out paperwork to get insurance to cover a needed medication.   If a prior authorization   is required to get your medication covered by your insurance company, please allow us 1-2 business days to complete this process.  Drug prices often vary depending on where the prescription is filled and some pharmacies may offer cheaper prices.  The website www.goodrx.com contains coupons for medications through different pharmacies. The prices here do not account for what the cost may be with help from insurance (it may be cheaper with your insurance), but the website can give you the price if you did not use any insurance.  - You can print the associated coupon and take it with your prescription to the pharmacy.  - You may also stop by our office during regular business hours and pick up a GoodRx coupon card.  - If you need your prescription sent electronically to a different pharmacy, notify our office through [facility] MyChart or by phone at [phone] option 4.     Si Usted Necesita Algo Despus de Su Visita  Tambin puede enviarnos un mensaje a travs de MyChart. Por lo general respondemos a los mensajes de  MyChart en el transcurso de 1 a 2 das hbiles.  Para renovar recetas, por favor pida a su farmacia que se ponga en contacto con nuestra oficina. Nuestro nmero de fax es el [phone].  Si tiene un asunto urgente cuando la clnica est cerrada y que no puede esperar hasta el siguiente da hbil, puede llamar/localizar a su doctor(a) al nmero que aparece a continuacin.   Por favor, tenga en cuenta que aunque hacemos todo lo posible para estar disponibles para asuntos urgentes fuera del horario de oficina, no estamos disponibles las 24 horas del da, los 7 das de la semana.   Si tiene un problema urgente y no puede comunicarse con nosotros, puede optar por buscar atencin mdica  en el consultorio de su doctor(a), en una clnica privada, en un centro de atencin urgente o en una sala de emergencias.  Si tiene una emergencia mdica, por favor llame inmediatamente al 911 o vaya a la sala de emergencias.  Nmeros de bper  - Dr. Kowalski: [phone]  - Dra. Moye: [phone]  - Dra. Stewart: [phone]  En caso de inclemencias del tiempo, por favor llame a nuestra lnea principal al [phone] para una actualizacin sobre el estado de cualquier retraso o cierre.  Consejos para la medicacin en dermatologa: Por favor, guarde las cajas en las que vienen los medicamentos de uso tpico para ayudarle a seguir las instrucciones sobre dnde y cmo usarlos. Las farmacias generalmente imprimen las instrucciones del medicamento slo en las cajas y no directamente en los tubos del medicamento.   Si su medicamento es muy caro, por favor, pngase en contacto con nuestra oficina llamando al [phone] y presione la opcin 4 o envenos un mensaje a travs de MyChart.   No podemos decirle cul ser su copago por los medicamentos por adelantado ya que esto es diferente dependiendo de la cobertura de su seguro. Sin embargo, es posible que podamos encontrar un medicamento sustituto a menor costo o  llenar un formulario para que el seguro cubra el medicamento que se considera necesario.   Si se requiere una autorizacin previa para que su compaa de seguros cubra su medicamento, por favor permtanos de 1 a 2 das hbiles para completar este proceso.  Los precios de los medicamentos varan con frecuencia dependiendo del lugar de dnde se surte la receta y alguna farmacias pueden ofrecer precios ms baratos.  El sitio web www.goodrx.com tiene cupones para medicamentos   de diferentes farmacias. Los precios aqu no tienen en cuenta lo que podra costar con la ayuda del seguro (puede ser ms barato con su seguro), pero el sitio web puede darle el precio si no utiliz ningn seguro.  - Puede imprimir el cupn correspondiente y llevarlo con su receta a la farmacia.  - Tambin puede pasar por nuestra oficina durante el horario de atencin regular y recoger una tarjeta de cupones de GoodRx.  - Si necesita que su receta se enve electrnicamente a una farmacia diferente, informe a nuestra oficina a travs de MyChart de [facility] o por telfono llamando al [phone] y presione la opcin 4.  

## 2022-09-06 NOTE — Progress Notes (Signed)
Follow-Up Visit   Subjective  Christopher Oconnell is a 72 y.o. male who presents for the following: Skin Cancer Screening and Full Body Skin Exam Some spots at temples he would like checked. Spot at back he would like checked.  The patient presents for Total-Body Skin Exam (TBSE) for skin cancer screening and mole check. The patient has spots, moles and lesions to be evaluated, some may be new or changing and the patient has concerns that these could be cancer.  The following portions of the chart were reviewed this encounter and updated as appropriate: medications, allergies, medical history  Review of Systems:  No other skin or systemic complaints except as noted in HPI or Assessment and Plan.  Objective  Well appearing patient in no apparent distress; mood and affect are within normal limits.  A full examination was performed including scalp, head, eyes, ears, nose, lips, neck, chest, axillae, abdomen, back, buttocks, bilateral upper extremities, bilateral lower extremities, hands, feet, fingers, toes, fingernails, and toenails. All findings within normal limits unless otherwise noted below.   Relevant physical exam findings are noted in the Assessment and Plan.  back x 2 (2) Erythematous stuck-on, waxy papule or plaque  Left Mid 3rd Finger x 1 Verrucous papules -- Discussed viral etiology and contagion.   b/l arms and face x 10, head and ears x 6 (16) Erythematous thin papules/macules with gritty scale.    Assessment & Plan   ROSACEA with rhinophyma  Exam Mid face erythema with telangiectasias +/- scattered inflammatory papules  Rosacea is a chronic progressive skin condition usually affecting the face of adults, causing redness and/or acne bumps. It is treatable but not curable. It sometimes affects the eyes (ocular rosacea) as well. It may respond to topical and/or systemic medication and can flare with stress, sun exposure, alcohol, exercise, topical steroids (including  hydrocortisone/cortisone 10) and some foods.  Daily application of broad spectrum spf 30+ sunscreen to face is recommended to reduce flares.  Patient denies grittiness of the eyes  Treatment Plan Patient declines treatment  LENTIGINES, SEBORRHEIC KERATOSES, HEMANGIOMAS - Benign normal skin lesions - Benign-appearing - Call for any changes  MELANOCYTIC NEVI - Tan-brown and/or pink-flesh-colored symmetric macules and papules - Benign appearing on exam today - Observation - Call clinic for new or changing moles - Recommend daily use of broad spectrum spf 30+ sunscreen to sun-exposed areas.   ACTINIC DAMAGE - Chronic condition, secondary to cumulative UV/sun exposure - diffuse scaly erythematous macules with underlying dyspigmentation - Recommend daily broad spectrum sunscreen SPF 30+ to sun-exposed areas, reapply every 2 hours as needed.  - Staying in the shade or wearing long sleeves, sun glasses (UVA+UVB protection) and wide brim hats (4-inch brim around the entire circumference of the hat) are also recommended for sun protection.  - Call for new or changing lesions.  HISTORY OF BASAL CELL CARCINOMA OF THE SKIN - No evidence of recurrence today - Recommend regular full body skin exams - Recommend daily broad spectrum sunscreen SPF 30+ to sun-exposed areas, reapply every 2 hours as needed.  - Call if any new or changing lesions are noted between office visits  HISTORY OF DYSPLASTIC NEVUS No evidence of recurrence today Recommend regular full body skin exams Recommend daily broad spectrum sunscreen SPF 30+ to sun-exposed areas, reapply every 2 hours as needed.  Call if any new or changing lesions are noted between office visits  SKIN CANCER SCREENING PERFORMED TODAY.  Inflamed seborrheic keratosis (2) back x 2  Symptomatic, irritating, patient would like treated.  Destruction of lesion - back x 2 Complexity: simple   Destruction method: cryotherapy   Informed consent:  discussed and consent obtained   Timeout:  patient name, date of birth, surgical site, and procedure verified Lesion destroyed using liquid nitrogen: Yes   Region frozen until ice ball extended beyond lesion: Yes   Outcome: patient tolerated procedure well with no complications   Post-procedure details: wound care instructions given    Viral warts, unspecified type Left Mid 3rd Finger x 1  Viral Wart (HPV) Counseling  Discussed viral / HPV (Human Papilloma Virus) etiology and risk of spread /infectivity to other areas of body as well as to other people.  Multiple treatments and methods may be required to clear warts and it is possible treatment may not be successful.  Treatment risks include discoloration; scarring and there is still potential for wart recurrence.  Destruction of lesion - Left Mid 3rd Finger x 1 Complexity: simple   Destruction method: cryotherapy   Informed consent: discussed and consent obtained   Timeout:  patient name, date of birth, surgical site, and procedure verified Lesion destroyed using liquid nitrogen: Yes   Region frozen until ice ball extended beyond lesion: Yes   Outcome: patient tolerated procedure well with no complications   Post-procedure details: wound care instructions given    Actinic keratosis (16) b/l arms and face x 10, head and ears x 6  Actinic keratoses are precancerous spots that appear secondary to cumulative UV radiation exposure/sun exposure over time. They are chronic with expected duration over 1 year. A portion of actinic keratoses will progress to squamous cell carcinoma of the skin. It is not possible to reliably predict which spots will progress to skin cancer and so treatment is recommended to prevent development of skin cancer.  Recommend daily broad spectrum sunscreen SPF 30+ to sun-exposed areas, reapply every 2 hours as needed.  Recommend staying in the shade or wearing long sleeves, sun glasses (UVA+UVB protection) and wide  brim hats (4-inch brim around the entire circumference of the hat). Call for new or changing lesions.  Destruction of lesion - b/l arms and face x 10, head and ears x 6 Complexity: simple   Destruction method: cryotherapy   Informed consent: discussed and consent obtained   Timeout:  patient name, date of birth, surgical site, and procedure verified Lesion destroyed using liquid nitrogen: Yes   Region frozen until ice ball extended beyond lesion: Yes   Outcome: patient tolerated procedure well with no complications   Post-procedure details: wound care instructions given    Skin cancer screening  Actinic skin damage  Lentigo  Melanocytic nevus, unspecified location  History of dysplastic nevus  History of basal cell carcinoma   Return in about 1 year (around 09/06/2023) for TBSE.  IAsher Muir, CMA, am acting as scribe for Armida Sans, MD.  Documentation: I have reviewed the above documentation for accuracy and completeness, and I agree with the above.  Armida Sans, MD

## 2022-09-07 ENCOUNTER — Encounter: Payer: Self-pay | Admitting: Dermatology

## 2022-09-20 ENCOUNTER — Other Ambulatory Visit: Payer: Self-pay | Admitting: Nurse Practitioner

## 2022-10-24 DIAGNOSIS — J84111 Idiopathic interstitial pneumonia, not otherwise specified: Secondary | ICD-10-CM | POA: Diagnosis not present

## 2022-10-24 DIAGNOSIS — J849 Interstitial pulmonary disease, unspecified: Secondary | ICD-10-CM | POA: Diagnosis not present

## 2022-10-25 ENCOUNTER — Encounter (INDEPENDENT_AMBULATORY_CARE_PROVIDER_SITE_OTHER): Payer: Self-pay

## 2022-11-14 ENCOUNTER — Other Ambulatory Visit: Payer: Self-pay | Admitting: Nurse Practitioner

## 2022-11-21 ENCOUNTER — Ambulatory Visit (INDEPENDENT_AMBULATORY_CARE_PROVIDER_SITE_OTHER): Payer: Medicare HMO | Admitting: Nurse Practitioner

## 2022-11-21 VITALS — BP 120/60 | HR 93 | Temp 98.8°F | Ht 68.0 in | Wt 203.0 lb

## 2022-11-21 DIAGNOSIS — E1149 Type 2 diabetes mellitus with other diabetic neurological complication: Secondary | ICD-10-CM

## 2022-11-21 DIAGNOSIS — I10 Essential (primary) hypertension: Secondary | ICD-10-CM | POA: Diagnosis not present

## 2022-11-21 DIAGNOSIS — S91302A Unspecified open wound, left foot, initial encounter: Secondary | ICD-10-CM | POA: Diagnosis not present

## 2022-11-21 LAB — POCT GLYCOSYLATED HEMOGLOBIN (HGB A1C): Hemoglobin A1C: 6.9 % — AB (ref 4.0–5.6)

## 2022-11-21 NOTE — Assessment & Plan Note (Signed)
Patient currently maintained on Maxide, enalapril, amlodipine, isosorbide.  Continue medication as prescribed

## 2022-11-21 NOTE — Assessment & Plan Note (Signed)
Patient currently maintained on metformin 500 mg twice daily.  If patient continues to have difficulty with metformin and antibiotic by pulmonology will switch patient to metformin 500 mg XR and to 2 tablets at 1 time or 1000 mg XR with 1 tablet at a time

## 2022-11-21 NOTE — Progress Notes (Signed)
Established Patient Office Visit  Subjective   Patient ID: Christopher Oconnell, male    DOB: [birth year]  Age: 72 y.o. MRN: [id number]  Chief Complaint  Patient presents with   Diabetes      DM2: patient is currently maintained on  metformin 500mg  bid  States that he is checking his sugar once a month. Lower 100s.  Denies hypoglycemia  HTN: patient is on maxzide, enalapril, amlodipine, isosorbide.  Patient tolerating medications well blood pressure under good limits denies lightheadedness or dizziness.  Lung: states that he is doing Kernoodle plum steroid, tregligiy and abx. States that his breathing has improved.  When he started the antibiotic and was taking it along with the metformin he experienced palpitations.  He has reached out to pulmonology in that regard.  States thathe would discontinue metformin.  Has reached out to pulmonology to discontinue the Bactrim at this current juncture.  If they decide to put patient back on we will switch patient from metformin 500 twice daily to metformin 1000 mg XR    Review of Systems  Constitutional:  Negative for chills and fever.  Respiratory:  Negative for shortness of breath (improved).   Cardiovascular:  Negative for chest pain.  Gastrointestinal:        Bm daily  Neurological:  Negative for tingling.  Psychiatric/Behavioral:  Negative for hallucinations and suicidal ideas.       Objective:     BP 120/60   Pulse 93   Temp 98.8 F (37.1 C) (Temporal)   Ht 5\' 8"  (1.727 m)   Wt 203 lb (92.1 kg)   SpO2 93%   BMI 30.87 kg/m    Physical Exam Vitals and nursing note reviewed.  Constitutional:      Appearance: Normal appearance.  Cardiovascular:     Rate and Rhythm: Normal rate and regular rhythm.     Heart sounds: Normal heart sounds.  Pulmonary:     Effort: Pulmonary effort is normal.     Breath sounds: Normal breath sounds.  Neurological:     Mental Status: He is alert.      Results for orders placed or  performed in visit on 11/21/22  POCT glycosylated hemoglobin (Hb A1C)  Result Value Ref Range   Hemoglobin A1C 6.9 (A) 4.0 - 5.6 %   HbA1c POC (<> result, manual entry)     HbA1c, POC (prediabetic range)     HbA1c, POC (controlled diabetic range)        The 10-year ASCVD risk score (Arnett DK, et al., 2019) is: 38.5%    Assessment & Plan:   Problem List Items Addressed This Visit       Cardiovascular and Mediastinum   Essential hypertension    Patient currently maintained on Maxide, enalapril, amlodipine, isosorbide.  Continue medication as prescribed        Endocrine   Type 2 diabetes mellitus with neurological complications (HCC)    Patient currently maintained on metformin 500 mg twice daily.  If patient continues to have difficulty with metformin and antibiotic by pulmonology will switch patient to metformin 500 mg XR and to 2 tablets at 1 time or 1000 mg XR with 1 tablet at a time      Relevant Orders   Ambulatory referral to Podiatry   POCT glycosylated hemoglobin (Hb A1C) (Completed)     Other   Open wound of left foot excluding one or more toes - Primary    Patient has calluses to bilateral medial great  toes.  Has been using some type of pumice stone to file down patient has area of seems to be eschar.  Ambulatory referral to podiatry encourage patient to discontinue using pumice stone      Relevant Orders   Ambulatory referral to Podiatry    Return in about 6 months (around 05/21/2023) for CPE and Labs.    Audria Nine, NP

## 2022-11-21 NOTE — Assessment & Plan Note (Signed)
Patient has calluses to bilateral medial great toes.  Has been using some type of pumice stone to file down patient has area of seems to be eschar.  Ambulatory referral to podiatry encourage patient to discontinue using pumice stone

## 2022-11-21 NOTE — Patient Instructions (Signed)
Nice to see you today  I want to see you in 6 months for your physical and labs, sooner if you need me

## 2022-11-29 ENCOUNTER — Ambulatory Visit: Payer: Self-pay | Admitting: Podiatry

## 2022-11-29 DIAGNOSIS — Q828 Other specified congenital malformations of skin: Secondary | ICD-10-CM

## 2022-11-29 NOTE — Progress Notes (Signed)
Subjective:  Patient ID: Christopher Oconnell, male    DOB: May 22, 1950,  MRN: [id number]  Chief Complaint  Patient presents with   Callouses    Bilateral hallux toes callus     72 y.o. male presents with the above complaint. Patient presents bilateral hallux IPJ hyperkeratotic lesion porokeratotic lesion painful to touch is progressive gotten worse worse with ambulation worse with pressure she would like to have it debrided down he has not seen and was prior to seeing me he is a diabetic with controlled A1c.  He denies any other acute complaints   Review of Systems: Negative except as noted in the HPI. Denies N/V/F/Ch.  Past Medical History:  Diagnosis Date   Arthritis    Asthma    Atypical mole 10/19/2020   R med bicep, mod to severe, exc 11/08/20   Basal cell carcinoma 11/05/2019   Right ear above crus. Nodular and infiltrative patterns. Excised 01/19/2020, margins free.   Coronary artery disease    Diabetes mellitus without complication (HCC)    Dysplastic nevus 11/08/2020   left med bicep at junction of L deltoid - moderate   Dysplastic nevus 11/08/2020   left med bicep at junction of L deltoid, moderate atypia   GERD (gastroesophageal reflux disease)    Hyperlipidemia    Hypertension     Current Outpatient Medications:    amLODipine (NORVASC) 10 MG tablet, Take 1 tablet (10 mg total) by mouth daily., Disp: 90 tablet, Rfl: 3   aspirin EC 81 MG tablet, Take 81 mg by mouth daily., Disp: , Rfl:    atorvastatin (LIPITOR) 40 MG tablet, Take 1 tablet (40 mg total) by mouth daily., Disp: 90 tablet, Rfl: 3   enalapril (VASOTEC) 20 MG tablet, Take 1 tablet (20 mg total) by mouth daily., Disp: 90 tablet, Rfl: 3   FREESTYLE LITE test strip, USE TO CHECK BLOOD GLUCOSE UP TO 3 TIMES DAILY., Disp: 100 strip, Rfl: 2   isosorbide mononitrate (IMDUR) 30 MG 24 hr tablet, Take 1 tablet (30 mg total) by mouth daily., Disp: 90 tablet, Rfl: 3   metFORMIN (GLUCOPHAGE) 500 MG tablet, TAKE 1 TABLET  BY MOUTH TWICE A DAY WITH FOOD, Disp: 180 tablet, Rfl: 1   Multiple Vitamin (MULTIVITAMIN) tablet, Take 1 tablet by mouth daily., Disp: , Rfl:    nitroGLYCERIN (NITROSTAT) 0.4 MG SL tablet, Place 1 tablet (0.4 mg total) under the tongue every 5 (five) minutes as needed for chest pain., Disp: 25 tablet, Rfl: 3   Omega-3 Fatty Acids (FISH OIL PO), Take 1,200 mg by mouth. Takes 1 tablet qd., Disp: , Rfl:    pantoprazole (PROTONIX) 40 MG tablet, Take 40 mg by mouth daily as needed., Disp: , Rfl:    Potassium Gluconate 550 MG TABS, Take by mouth daily as needed., Disp: , Rfl:    predniSONE (DELTASONE) 5 MG tablet, Take 5 mg by mouth daily., Disp: , Rfl:    sulfamethoxazole-trimethoprim (BACTRIM) 400-80 MG tablet, Take 1 tablet by mouth 3 (three) times a week., Disp: , Rfl:    TRELEGY ELLIPTA 200-62.5-25 MCG/ACT AEPB, Inhale 1 puff into the lungs daily., Disp: , Rfl:    triamterene-hydrochlorothiazide (MAXZIDE-25) 37.5-25 MG tablet, TAKE 1 TABLET BY MOUTH EVERY DAY IN THE MORNING, Disp: 90 tablet, Rfl: 1   Turmeric (QC TUMERIC COMPLEX PO), Take by mouth daily., Disp: , Rfl:   Social History   Tobacco Use  Smoking Status Former   Current packs/day: 0.00   Average packs/day: 3.5 packs/day  for 30.0 years (105.0 ttl pk-yrs)   Types: Cigarettes   Start date: 63   Quit date: 1993   Years since quitting: 31.7  Smokeless Tobacco Never    No Known Allergies Objective:  There were no vitals filed for this visit. There is no height or weight on file to calculate BMI. Constitutional Well developed. Well nourished.  Vascular Dorsalis pedis pulses palpable bilaterally. Posterior tibial pulses palpable bilaterally. Capillary refill normal to all digits.  No cyanosis or clubbing noted. Pedal hair growth normal.  Neurologic Normal speech. Oriented to person, place, and time. Epicritic sensation to light touch grossly present bilaterally.  Dermatologic Bilateral hallux IPJ hyperkeratotic lesion with  central nucleated core noted pain on palpation to the lesion.  Pinch callus noted.  Limited range of motion noted of the first metatarsophalangeal joint  Orthopedic: Normal joint ROM without pain or crepitus bilaterally. No visible deformities. No bony tenderness.   Radiographs: None Assessment:   1. Porokeratosis    Plan:  Patient was evaluated and treated and all questions answered.  Bilateral hallux porokeratosis IPJ -All questions and concerns were discussed with the patient in extensive detail given the amount of callus buildup there is patient would benefit from debridement of the lesion using chisel blade handle the lesion was debrided down to healthy dry tissue.  No complication noted no pinpoint bleeding noted.  No ulceration noted. -I discussed shoe gear modification.  No follow-ups on file.

## 2022-12-26 ENCOUNTER — Encounter: Payer: Self-pay | Admitting: Internal Medicine

## 2022-12-26 ENCOUNTER — Ambulatory Visit: Payer: Medicare HMO | Attending: Internal Medicine | Admitting: Internal Medicine

## 2022-12-26 VITALS — BP 144/75 | HR 84 | Ht 68.0 in | Wt 204.8 lb

## 2022-12-26 DIAGNOSIS — E1169 Type 2 diabetes mellitus with other specified complication: Secondary | ICD-10-CM | POA: Diagnosis not present

## 2022-12-26 DIAGNOSIS — J849 Interstitial pulmonary disease, unspecified: Secondary | ICD-10-CM

## 2022-12-26 DIAGNOSIS — I25118 Atherosclerotic heart disease of native coronary artery with other forms of angina pectoris: Secondary | ICD-10-CM

## 2022-12-26 DIAGNOSIS — I10 Essential (primary) hypertension: Secondary | ICD-10-CM | POA: Diagnosis not present

## 2022-12-26 DIAGNOSIS — E785 Hyperlipidemia, unspecified: Secondary | ICD-10-CM

## 2022-12-26 NOTE — Patient Instructions (Signed)

## 2022-12-26 NOTE — Progress Notes (Signed)
Cardiology Office Note:  .   Date:  12/26/2022  ID:  Christopher Oconnell, DOB 1950/10/14, MRN [id number] PCP: Christopher Emms, NP  [facility] Providers Cardiologist:  Christopher Kendall, MD     History of Present Illness: .   Christopher Oconnell is a 72 y.o. male with history of coronary artery disease status post angioplasty to D1 in 1993, stroke, hypertension, hyperlipidemia, type 2 diabetes mellitus, and interstitial lung disease, who presents for follow-up of coronary artery disease.  I last saw him in June, at which time he complained of regular episodes of chest tightness with modest activity that had been going on for 7 or 8 months.  He had just been diagnosed with interstitial lung disease and attributed some of the chest discomfort to that.  We discussed pharmacologic myocardial perfusion stress test and cardiac catheterization for further assessment, though Christopher Oconnell wished to defer testing.  We added isosorbide mononitrate for antianginal therapy.  Today, Christopher Oconnell reports that he has been feeling better.  He believes that his pulmonary treatments for his asthma and interstitial lung disease consisting of Trelegy, prednisone, and antibiotics, has helped tremendously.  He still gets some mild discomfort in his chest when he "overdoes it," though this is infrequent.  He has noted some sporadic headaches but overall is tolerating isosorbide mononitrate well.  His home blood pressures have been a little bit higher recently, which he attributes to prednisone.  He denies palpitations, lightheadedness, and edema.  His shortness of breath has improved somewhat with his pulmonary treatments as well.  ROS: See HPI  Studies Reviewed: .        Risk Assessment/Calculations:         Physical Exam:   VS:  BP (!) 144/75 (BP Location: Left Arm, Patient Position: Sitting, Cuff Size: Normal)   Pulse 84   Ht 5\' 8"  (1.727 m)   Wt 204 lb 12.8 oz (92.9 kg)   SpO2 96%   BMI 31.14 kg/m     Wt Readings from Last 3 Encounters:  12/26/22 204 lb 12.8 oz (92.9 kg)  11/21/22 203 lb (92.1 kg)  08/30/22 203 lb (92.1 kg)    General:  NAD. Neck: No JVD or HJR. Lungs: Clear to auscultation bilaterally without wheezes or crackles. Heart: Regular rate and rhythm without murmurs, rubs, or gallops. Abdomen: Soft, nontender, nondistended. Extremities: No lower extremity edema.  ASSESSMENT AND PLAN: .    Coronary artery disease with stable angina and hyperlipidemia associated with type 2 diabetes mellitus: Overall, Christopher Oconnell is doing well with fairly minimal exertional chest discomfort.  He attributes it primarily to his interstitial lung disease and asthma, though I suspect that some underlying ischemic heart disease is playing a role as well.  We again discussed further testing including coronary CTA, pharmacologic MPI, and cardiac catheterization.  Christopher Oconnell is most interested in coronary CTA, though given the severe calcification of his coronary arteries noted on prior CTs of the chest, I worry about the accuracy of this test.  He is not interested in pursuing a pharmacologic MPI or catheterization at this time.  I offered to order a coronary CTA today, though Christopher Oconnell wishes to defer this until he has completed his antibiotics and prednisone and has followed up with his pulmonologist again.  We will continue current dose of isosorbide mononitrate as well as aspirin and statin therapy.  Given that direct LDL was suboptimal on last check in March (90), I recommended escalation of atorvastatin  to 80 mg daily; Christopher Oconnell declined.  Lipids should be rechecked at next visit with Christopher Oconnell.  I will defer ongoing management of diabetes to Christopher Oconnell as well.  Hypertension: Blood pressure mildly elevated today but better at other repeat doctors visits as recently as early September.  Ongoing prednisone therapy may be contributing.  We discussed escalation of isosorbide mononitrate,  but Christopher Oconnell wishes to defer any medication changes today.  Asthma and interstitial lung disease: Continue current therapy and close follow-up with pulmonology.    Dispo: Return to clinic in 6 months.  Signed, Christopher Kendall, MD

## 2023-03-08 ENCOUNTER — Other Ambulatory Visit: Payer: Self-pay | Admitting: Pulmonary Disease

## 2023-03-08 DIAGNOSIS — J84111 Idiopathic interstitial pneumonia, not otherwise specified: Secondary | ICD-10-CM

## 2023-03-15 ENCOUNTER — Ambulatory Visit: Admission: RE | Admit: 2023-03-15 | Payer: PPO | Source: Ambulatory Visit

## 2023-03-17 ENCOUNTER — Other Ambulatory Visit: Payer: Self-pay | Admitting: Nurse Practitioner

## 2023-03-18 ENCOUNTER — Ambulatory Visit
Admission: RE | Admit: 2023-03-18 | Discharge: 2023-03-18 | Disposition: A | Discharge status: Home / Self Care | Payer: PPO | Source: Ambulatory Visit | Attending: Pulmonary Disease | Admitting: Pulmonary Disease

## 2023-03-18 DIAGNOSIS — R918 Other nonspecific abnormal finding of lung field: Secondary | ICD-10-CM | POA: Diagnosis not present

## 2023-03-18 DIAGNOSIS — J479 Bronchiectasis, uncomplicated: Secondary | ICD-10-CM | POA: Diagnosis not present

## 2023-03-18 DIAGNOSIS — J84111 Idiopathic interstitial pneumonia, not otherwise specified: Secondary | ICD-10-CM | POA: Diagnosis not present

## 2023-03-18 DIAGNOSIS — J984 Other disorders of lung: Secondary | ICD-10-CM | POA: Diagnosis not present

## 2023-04-05 ENCOUNTER — Other Ambulatory Visit: Payer: Self-pay | Admitting: Nurse Practitioner

## 2023-04-05 DIAGNOSIS — J8410 Pulmonary fibrosis, unspecified: Secondary | ICD-10-CM | POA: Diagnosis not present

## 2023-04-06 ENCOUNTER — Other Ambulatory Visit: Payer: Self-pay | Admitting: Nurse Practitioner

## 2023-04-23 ENCOUNTER — Telehealth: Payer: Self-pay | Admitting: Internal Medicine

## 2023-04-23 NOTE — Telephone Encounter (Signed)
I contacted patient, he states he would like to know if Dr.End recommended to have repeat blood work, as he mentioned at last visit increasing Atorvastatin to 80 mg. Last lab work in the system from 05/2022. Advised with patient I would be glad to check on this and let him know.  Thanks!

## 2023-04-23 NOTE — Telephone Encounter (Signed)
Attempted to contact patient to discuss.  Left call back number.

## 2023-04-23 NOTE — Telephone Encounter (Signed)
Patient called back, stated that he wanted Dr.End to review the Chest CT in epic- ordered by pulmonary, but wanted him to review it as well. Advised I would route as requested.   Patient verbalized understanding.

## 2023-04-23 NOTE — Telephone Encounter (Signed)
Please let the patient know that I have reviewed his recent chest CT.  It again shows coronary artery calcification and aortic atherosclerosis, similar to the last chest CT in 03/2022.  If he has worsening shortness of breath or chest pain, we will need to readdress stress test or catheter sedation to better assess his coronary arteries, as this noncontrasted CT of the chest provides only limited information about his heart.  Yvonne Kendall, MD [facility]

## 2023-04-23 NOTE — Telephone Encounter (Signed)
Pt requesting cb to discuss recent CT chest done and results, has some concerns regarding findings

## 2023-04-25 DIAGNOSIS — R0609 Other forms of dyspnea: Secondary | ICD-10-CM | POA: Diagnosis not present

## 2023-04-25 DIAGNOSIS — K219 Gastro-esophageal reflux disease without esophagitis: Secondary | ICD-10-CM | POA: Diagnosis not present

## 2023-04-25 DIAGNOSIS — E66811 Obesity, class 1: Secondary | ICD-10-CM | POA: Diagnosis not present

## 2023-04-25 DIAGNOSIS — J8489 Other specified interstitial pulmonary diseases: Secondary | ICD-10-CM | POA: Diagnosis not present

## 2023-04-25 DIAGNOSIS — J479 Bronchiectasis, uncomplicated: Secondary | ICD-10-CM | POA: Diagnosis not present

## 2023-04-25 DIAGNOSIS — J849 Interstitial pulmonary disease, unspecified: Secondary | ICD-10-CM | POA: Diagnosis not present

## 2023-05-03 ENCOUNTER — Ambulatory Visit: Payer: PPO

## 2023-05-03 VITALS — Ht 68.0 in | Wt 204.0 lb

## 2023-05-03 DIAGNOSIS — Z0000 Encounter for general adult medical examination without abnormal findings: Secondary | ICD-10-CM

## 2023-05-03 NOTE — Progress Notes (Signed)
 Subjective:   Christopher Oconnell is a 73 y.o. who presents for a Medicare Wellness preventive visit.  Visit Complete: Virtual I connected with  STEPHENS SHREVE on 05/03/23 by a audio enabled telemedicine application and verified that I am speaking with the correct person using two identifiers.  Patient Location: Home  Provider Location: Home Office  I discussed the limitations of evaluation and management by telemedicine. The patient expressed understanding and agreed to proceed.  Vital Signs: Because this visit was a virtual/telehealth visit, some criteria may be missing or patient reported. Any vitals not documented were not able to be obtained and vitals that have been documented are patient reported.  VideoDeclined- This patient declined Librarian, academic. Therefore the visit was completed with audio only.  AWV Questionnaire: Yes: Patient Medicare AWV questionnaire was completed by the patient on 05/02/2023; I have confirmed that all information answered by patient is correct and no changes since this date.  Cardiac Risk Factors include: advanced age (>52men, >70 women);diabetes mellitus;dyslipidemia;hypertension;male gender;obesity (BMI >30kg/m2);sedentary lifestyle     Objective:    Today's Vitals   05/03/23 0815  Weight: 204 lb (92.5 kg)  Height: 5\' 8"  (1.727 m)   Body mass index is 31.02 kg/m.     05/03/2023    8:23 AM 04/30/2022    8:12 AM 04/27/2021    8:22 AM 04/26/2020    8:12 AM  Advanced Directives  Does Patient Have a Medical Advance Directive? Yes Yes Yes No  Type of Estate agent of [place];Living will Healthcare Power of [place];Living will Healthcare Power of [place];Living will   Does patient want to make changes to medical advance directive?  No - Patient declined Yes (MAU/Ambulatory/Procedural Areas - Information given) No - Patient declined  Copy of Healthcare Power of Attorney in Chart? Yes -  validated most recent copy scanned in chart (See row information) Yes - validated most recent copy scanned in chart (See row information)      Current Medications (verified) Outpatient Encounter Medications as of 05/03/2023  Medication Sig   amLODipine (NORVASC) 10 MG tablet TAKE 1 TABLET BY MOUTH EVERY DAY   aspirin EC 81 MG tablet Take 81 mg by mouth daily.   atorvastatin (LIPITOR) 40 MG tablet Take 1 tablet (40 mg total) by mouth daily.   enalapril (VASOTEC) 20 MG tablet TAKE 1 TABLET BY MOUTH EVERY DAY   FREESTYLE LITE test strip USE TO CHECK BLOOD GLUCOSE UP TO 3 TIMES DAILY.   isosorbide mononitrate (IMDUR) 30 MG 24 hr tablet Take 1 tablet (30 mg total) by mouth daily.   metFORMIN (GLUCOPHAGE) 500 MG tablet TAKE 1 TABLET BY MOUTH TWICE A DAY WITH FOOD   Multiple Vitamin (MULTIVITAMIN) tablet Take 1 tablet by mouth daily.   nitroGLYCERIN (NITROSTAT) 0.4 MG SL tablet Place 1 tablet (0.4 mg total) under the tongue every 5 (five) minutes as needed for chest pain.   Omega-3 Fatty Acids (FISH OIL PO) Take 1,200 mg by mouth. Takes 1 tablet qd.   pantoprazole (PROTONIX) 40 MG tablet Take 40 mg by mouth daily as needed.   Potassium Gluconate 550 MG TABS Take by mouth daily as needed.   predniSONE (DELTASONE) 5 MG tablet Take 5 mg by mouth daily.   sulfamethoxazole-trimethoprim (BACTRIM) 400-80 MG tablet Take 1 tablet by mouth 3 (three) times a week.   TRELEGY ELLIPTA 200-62.5-25 MCG/ACT AEPB Inhale 1 puff into the lungs daily.   triamterene-hydrochlorothiazide (MAXZIDE-25) 37.5-25 MG tablet TAKE  1 TABLET BY MOUTH EVERY DAY IN THE MORNING   Turmeric (QC TUMERIC COMPLEX PO) Take by mouth daily.   No facility-administered encounter medications on file as of 05/03/2023.    Allergies (verified) Patient has no known allergies.   History: Past Medical History:  Diagnosis Date   Arthritis    Asthma    Atypical mole 10/19/2020   R med bicep, mod to severe, exc 11/08/20   Basal cell carcinoma  11/05/2019   Right ear above crus. Nodular and infiltrative patterns. Excised 01/19/2020, margins free.   Coronary artery disease    Diabetes mellitus without complication (HCC)    Dysplastic nevus 11/08/2020   left med bicep at junction of L deltoid - moderate   Dysplastic nevus 11/08/2020   left med bicep at junction of L deltoid, moderate atypia   GERD (gastroesophageal reflux disease)    Hyperlipidemia    Hypertension    Past Surgical History:  Procedure Laterality Date   CORONARY ANGIOPLASTY     No stents   NOSE SURGERY     SKIN CANCER EXCISION  01/2020   right ear    TONSILLECTOMY     Family History  Problem Relation Age of Onset   Dementia Mother    Coronary artery disease Father    Melanoma Father    Pancreatic cancer Father    Breast cancer Sister    Breast cancer Sister    Drug abuse Sister    Cancer Maternal Grandmother        unsure of the type   Heart attack Paternal Grandmother    Heart attack Paternal Grandfather    Social History   Socioeconomic History   Marital status: Married    Spouse name: [name]   Number of children: 0   Years of education: [employer] school   Highest education level: Associate degree: occupational, Scientist, product/process development, or vocational program  Occupational History   Not on file  Tobacco Use   Smoking status: Former    Current packs/day: 0.00    Average packs/day: 3.5 packs/day for 30.0 years (105.0 ttl pk-yrs)    Types: Cigarettes    Start date: 1963    Quit date: 1993    Years since quitting: 32.1   Smokeless tobacco: Never  Vaping Use   Vaping status: Never Used  Substance and Sexual Activity   Alcohol use: Not Currently    Comment: 15 beers on the weekends   Drug use: Never   Sexual activity: Not Currently  Other Topics Concern   Not on file  Social History Narrative   07/30/19   From: the area   Living: with wife, Christopher Oconnell (1986, together since 1981)   Work: Retired, Curator work in 2013      Family: sisters live nearby,  and in-laws are in [place]      Enjoys: hunting, fishing, golf (occasionally)      Exercise: daily activities, yard work   Diet: meat - primarily venison, salads, fish, chicken - no red meat      Safety   Seat belts: Yes    Guns: Yes  and not currently secure   Safe in relationships: Yes    Social Drivers of Corporate investment banker Strain: Patient Declined (05/02/2023)   Overall Financial Resource Strain (CARDIA)    Difficulty of Paying Living Expenses: Patient declined  Food Insecurity: Patient Declined (05/02/2023)   Hunger Vital Sign    Worried About Running Out of Food in the Last Year: Patient  declined    Barista in the Last Year: Patient declined  Transportation Needs: No Transportation Needs (05/02/2023)   PRAPARE - Administrator, Civil Service (Medical): No    Lack of Transportation (Non-Medical): No  Physical Activity: Unknown (05/02/2023)   Exercise Vital Sign    Days of Exercise per Week: Patient declined    Minutes of Exercise per Session: Not on file  Stress: No Stress Concern Present (05/02/2023)   [employer] of Occupational Health - Occupational Stress Questionnaire    Feeling of Stress : Not at all  Social Connections: Unknown (05/02/2023)   Social Connection and Isolation Panel [NHANES]    Frequency of Communication with Friends and Family: Once a week    Frequency of Social Gatherings with Friends and Family: Once a week    Attends Religious Services: Patient declined    Database administrator or Organizations: No    Attends Engineer, structural: Not on file    Marital Status: Married    Tobacco Counseling Counseling given: Not Answered   Clinical Intake:  Pre-visit preparation completed: Yes  Pain : No/denies pain    BMI - recorded: 31.02 Nutritional Status: BMI > 30  Obese Nutritional Risks: None Diabetes: Yes CBG done?: No Did pt. bring in CBG monitor from home?: No  How often do you need to have someone  help you when you read instructions, pamphlets, or other written materials from your doctor or pharmacy?: 1 - Never  Interpreter Needed?: No  Comments: lives with wife Information entered by :: B.Latrena Benegas,LPN   Activities of Daily Living     05/02/2023    4:58 PM  In your present state of health, do you have any difficulty performing the following activities:  Hearing? 0  Vision? 0  Difficulty concentrating or making decisions? 0  Walking or climbing stairs? 0  Dressing or bathing? 0  Doing errands, shopping? 0  Preparing Food and eating ? N  Using the Toilet? N  In the past six months, have you accidently leaked urine? N  Do you have problems with loss of bowel control? N  Managing your Medications? N  Managing your Finances? N  Housekeeping or managing your Housekeeping? N    Patient Care Team: Eden Emms, NP as PCP - General (Nurse Practitioner) End, Cristal Deer, MD as PCP - Cardiology (Cardiology) Vida Rigger, MD as Consulting Physician (Pulmonary Disease)  Indicate any recent Medical Services you may have received from other than [facility] providers in the past year (date may be approximate).     Assessment:   This is a routine wellness examination for Alika.  Hearing/Vision screen Hearing Screening - Comments:: Pt says his hearing is good;has slight ringing in ear Vision Screening - Comments:: Pt says his vision is good w/glasses (bifocals) Dr Willey Blade   Goals Addressed             This Visit's Progress    Patient Stated   On track    05/03/2023, I will maintain and continue medications as prescribed.      Patient Stated   On track    05/03/2023-Would like to maintain current routine     Patient Stated   On track    05/03/23-Enjoy Life.       Depression Screen     05/03/2023    8:21 AM 05/21/2022    2:37 PM 04/30/2022    8:09 AM 04/27/2021    8:25 AM 04/26/2020  8:15 AM 07/30/2019    8:49 AM  PHQ 2/9 Scores  PHQ - 2 Score 0 0 0 0 0 0   PHQ- 9 Score  2   0     Fall Risk     05/02/2023    4:58 PM 05/21/2022    2:36 PM 04/30/2022    8:03 AM 04/27/2021    8:24 AM 04/26/2020    8:14 AM  Fall Risk   Falls in the past year? 0 0 0 0 0  Number falls in past yr:  0 0 0 0  Injury with Fall? 0 0 0 0 0  Risk for fall due to : No Fall Risks No Fall Risks No Fall Risks No Fall Risks Medication side effect  Follow up Education provided;Falls prevention discussed Falls evaluation completed Falls prevention discussed;Falls evaluation completed Falls prevention discussed Falls evaluation completed;Falls prevention discussed    MEDICARE RISK AT HOME:  Medicare Risk at Home Any stairs in or around the home?: (Patient-Rptd) Yes If so, are there any without handrails?: (Patient-Rptd) No Home free of loose throw rugs in walkways, pet beds, electrical cords, etc?: (Patient-Rptd) Yes Adequate lighting in your home to reduce risk of falls?: (Patient-Rptd) Yes Life alert?: (Patient-Rptd) No Use of a cane, walker or w/c?: (Patient-Rptd) No Grab bars in the bathroom?: (Patient-Rptd) Yes Shower chair or bench in shower?: (Patient-Rptd) No Elevated toilet seat or a handicapped toilet?: (Patient-Rptd) No  TIMED UP AND GO:  Was the test performed?  No  Cognitive Function: 6CIT completed    04/26/2020    8:17 AM  MMSE - Mini Mental State Exam  Orientation to time 5  Orientation to Place 5  Registration 3  Attention/ Calculation 5  Recall 3  Language- repeat 1        05/03/2023    8:23 AM 04/30/2022    8:20 AM  6CIT Screen  What Year? 0 points 0 points  What month? 0 points 0 points  What time? 0 points 0 points  Count back from 20 0 points 0 points  Months in reverse 0 points 0 points  Repeat phrase 2 points 0 points  Total Score 2 points 0 points    Immunizations Immunization History  Administered Date(s) Administered   Fluad Quad(high Dose 65+) 12/15/2018, 12/29/2021, 01/23/2023   Influenza-Unspecified 12/14/2019,  01/05/2021   PFIZER(Purple Top)SARS-COV-2 Vaccination 04/27/2019, 05/18/2019, 05/17/2020   Pneumococcal Conjugate-13 09/21/2014   Pneumococcal Polysaccharide-23 09/16/2012, 05/10/2020   Tdap 11/20/2017   Zoster Recombinant(Shingrix) 07/24/2018, 09/29/2018    Screening Tests Health Maintenance  Topic Date Due   FOOT EXAM  05/11/2022   COVID-19 Vaccine (4 - 2024-25 season) 11/11/2022   Diabetic kidney evaluation - eGFR measurement  05/21/2023   Diabetic kidney evaluation - Urine ACR  05/21/2023   HEMOGLOBIN A1C  05/21/2023   OPHTHALMOLOGY EXAM  07/11/2023   Medicare Annual Wellness (AWV)  05/02/2024   Colonoscopy  05/01/2026   DTaP/Tdap/Td (2 - Td or Tdap) 11/21/2027   Pneumonia Vaccine 25+ Years old  Completed   INFLUENZA VACCINE  Completed   Hepatitis C Screening  Completed   Zoster Vaccines- Shingrix  Completed   HPV VACCINES  Aged Out    Health Maintenance  Health Maintenance Due  Topic Date Due   FOOT EXAM  05/11/2022   COVID-19 Vaccine (4 - 2024-25 season) 11/11/2022   Diabetic kidney evaluation - eGFR measurement  05/21/2023   Diabetic kidney evaluation - Urine ACR  05/21/2023   Health Maintenance  Items Addressed: yes, all complete   Additional Screening:  Vision Screening: Recommended annual ophthalmology exams for early detection of glaucoma and other disorders of the eye.  Dental Screening: Recommended annual dental exams for proper oral hygiene  Community Resource Referral / Chronic Care Management: CRR required this visit?  No   CCM required this visit?  No    Plan:     I have personally reviewed and noted the following in the patient's chart:   Medical and social history Use of alcohol, tobacco or illicit drugs  Current medications and supplements including opioid prescriptions. Patient is not currently taking opioid prescriptions. Functional ability and status Nutritional status Physical activity Advanced directives List of other  physicians Hospitalizations, surgeries, and ER visits in previous 12 months Vitals Screenings to include cognitive, depression, and falls Referrals and appointments  In addition, I have reviewed and discussed with patient certain preventive protocols, quality metrics, and best practice recommendations. A written personalized care plan for preventive services as well as general preventive health recommendations were provided to patient.    Sue Lush, LPN   1/61/0960   After Visit Summary: (MyChart) Due to this being a telephonic visit, the after visit summary with patients personalized plan was offered to patient via MyChart   Notes: Nothing significant to report at this time.

## 2023-05-03 NOTE — Patient Instructions (Signed)
[name] , Thank you for taking time to come for your Medicare Wellness Visit. I appreciate your ongoing commitment to your health goals. Please review the following plan we discussed and let me know if I can assist you in the future.   Referrals/Orders/Follow-Ups/Clinician Recommendations: none  This is a list of the screening recommended for you and due dates:  Health Maintenance  Topic Date Due   Complete foot exam   05/11/2022   Flu Shot  10/11/2022   COVID-19 Vaccine (4 - 2024-25 season) 11/11/2022   Yearly kidney function blood test for diabetes  05/21/2023   Yearly kidney health urinalysis for diabetes  05/21/2023   Hemoglobin A1C  05/21/2023   Eye exam for diabetics  07/11/2023   Medicare Annual Wellness Visit  05/02/2024   Colon Cancer Screening  05/01/2026   DTaP/Tdap/Td vaccine (2 - Td or Tdap) 11/21/2027   Pneumonia Vaccine  Completed   Hepatitis C Screening  Completed   Zoster (Shingles) Vaccine  Completed   HPV Vaccine  Aged Out    Advanced directives: (In Chart) A copy of your advanced directives are scanned into your chart should your provider ever need it.  Next Medicare Annual Wellness Visit scheduled for next year: Yes 05/04/2024 @ 8:10am televisit

## 2023-05-23 ENCOUNTER — Encounter: Payer: Medicare HMO | Admitting: Nurse Practitioner

## 2023-05-30 ENCOUNTER — Encounter: Payer: Self-pay | Admitting: Nurse Practitioner

## 2023-05-30 ENCOUNTER — Ambulatory Visit: Payer: Medicare HMO | Admitting: Nurse Practitioner

## 2023-05-30 VITALS — BP 122/60 | HR 76 | Temp 98.0°F | Ht 67.0 in | Wt 199.2 lb

## 2023-05-30 DIAGNOSIS — I10 Essential (primary) hypertension: Secondary | ICD-10-CM | POA: Diagnosis not present

## 2023-05-30 DIAGNOSIS — Z125 Encounter for screening for malignant neoplasm of prostate: Secondary | ICD-10-CM | POA: Diagnosis not present

## 2023-05-30 DIAGNOSIS — E669 Obesity, unspecified: Secondary | ICD-10-CM

## 2023-05-30 DIAGNOSIS — Z87891 Personal history of nicotine dependence: Secondary | ICD-10-CM | POA: Diagnosis not present

## 2023-05-30 DIAGNOSIS — Z79899 Other long term (current) drug therapy: Secondary | ICD-10-CM | POA: Diagnosis not present

## 2023-05-30 DIAGNOSIS — J849 Interstitial pulmonary disease, unspecified: Secondary | ICD-10-CM

## 2023-05-30 DIAGNOSIS — E785 Hyperlipidemia, unspecified: Secondary | ICD-10-CM

## 2023-05-30 DIAGNOSIS — Z7984 Long term (current) use of oral hypoglycemic drugs: Secondary | ICD-10-CM

## 2023-05-30 DIAGNOSIS — E1149 Type 2 diabetes mellitus with other diabetic neurological complication: Secondary | ICD-10-CM

## 2023-05-30 DIAGNOSIS — E1169 Type 2 diabetes mellitus with other specified complication: Secondary | ICD-10-CM | POA: Diagnosis not present

## 2023-05-30 DIAGNOSIS — Z0000 Encounter for general adult medical examination without abnormal findings: Secondary | ICD-10-CM

## 2023-05-30 LAB — LIPID PANEL
Cholesterol: 125 mg/dL (ref 0–200)
HDL: 38.4 mg/dL — ABNORMAL LOW (ref >39.00)
LDL Cholesterol: 64 mg/dL (ref 0–99)
NonHDL: 86.99
Total CHOL/HDL Ratio: 3
Triglycerides: 115 mg/dL (ref 0.0–149.0)
VLDL: 23 mg/dL (ref 0.0–40.0)

## 2023-05-30 LAB — CBC
HCT: 41.3 % (ref 39.0–52.0)
Hemoglobin: 14 g/dL (ref 13.0–17.0)
MCHC: 33.9 g/dL (ref 30.0–36.0)
MCV: 97.4 fl (ref 78.0–100.0)
Platelets: 299 10*3/uL (ref 150.0–400.0)
RBC: 4.23 Mil/uL (ref 4.22–5.81)
RDW: 12.5 % (ref 11.5–15.5)
WBC: 9.9 10*3/uL (ref 4.0–10.5)

## 2023-05-30 LAB — POCT GLYCOSYLATED HEMOGLOBIN (HGB A1C): Hemoglobin A1C: 6 % — AB (ref 4.0–5.6)

## 2023-05-30 LAB — MICROALBUMIN / CREATININE URINE RATIO
Creatinine,U: 97.2 mg/dL
Microalb Creat Ratio: 11.6 mg/g (ref 0.0–30.0)
Microalb, Ur: 1.1 mg/dL (ref 0.0–1.9)

## 2023-05-30 LAB — COMPREHENSIVE METABOLIC PANEL WITH GFR
ALT: 16 U/L (ref 0–53)
AST: 16 U/L (ref 0–37)
Albumin: 4.7 g/dL (ref 3.5–5.2)
Alkaline Phosphatase: 55 U/L (ref 39–117)
BUN: 19 mg/dL (ref 6–23)
CO2: 25 meq/L (ref 19–32)
Calcium: 9.8 mg/dL (ref 8.4–10.5)
Chloride: 104 meq/L (ref 96–112)
Creatinine, Ser: 1.25 mg/dL (ref 0.40–1.50)
GFR: 56.9 mL/min — ABNORMAL LOW (ref >60.00)
Glucose, Bld: 97 mg/dL (ref 70–99)
Potassium: 4.3 meq/L (ref 3.5–5.1)
Sodium: 139 meq/L (ref 135–145)
Total Bilirubin: 0.9 mg/dL (ref 0.2–1.2)
Total Protein: 6.8 g/dL (ref 6.0–8.3)

## 2023-05-30 LAB — URINALYSIS, MICROSCOPIC ONLY
RBC / HPF: NONE SEEN (ref 0–?)
WBC, UA: NONE SEEN (ref 0–?)

## 2023-05-30 LAB — TSH: TSH: 1.18 u[IU]/mL (ref 0.35–5.50)

## 2023-05-30 LAB — MAGNESIUM: Magnesium: 1.7 mg/dL (ref 1.5–2.5)

## 2023-05-30 LAB — PSA, MEDICARE: PSA: 0.84 ng/mL (ref 0.10–4.00)

## 2023-05-30 LAB — VITAMIN B12: Vitamin B-12: 248 pg/mL (ref 211–911)

## 2023-05-30 MED ORDER — AMLODIPINE BESYLATE 5 MG PO TABS
5.0000 mg | ORAL_TABLET | Freq: Every day | ORAL | 0 refills | Status: DC
Start: 2023-05-30 — End: 2023-06-27

## 2023-05-30 NOTE — Assessment & Plan Note (Signed)
 Discussed age-appropriate immunizations and screening exams.  Did review patient's personal, surgical, social, family histories.  Patient up-to-date on all age-appropriate vaccinations.  Patient up-to-date on CRC screening.  PSA today for prostate cancer screening.  Patient was given information at discharge about having healthcare maintenance with physical guidance.

## 2023-05-30 NOTE — Assessment & Plan Note (Signed)
 Patient currently maintained atorvastatin 40 mg and ASA.  Pending lipid panel

## 2023-05-30 NOTE — Patient Instructions (Signed)
 Nice to see you today I am changing amlodipine to 5mg  daily instead of 10 Follow up with me in 1 month for a BP recheck

## 2023-05-30 NOTE — Assessment & Plan Note (Signed)
 Patient currently maintained on metformin 500 mg twice daily.  A1c.  If reduction blood pressure medications affect lightheaded or dizziness consider reducing the amount of metformin

## 2023-05-30 NOTE — Progress Notes (Signed)
 Established Patient Office Visit  Subjective   Patient ID: Christopher Oconnell, male    DOB: 1950/07/10  Age: 73 y.o. MRN: [id number]  Chief Complaint  Patient presents with   Annual Exam    HPI  DM2: on metformin 500mg  BID. He will check it infrequently. The last time he checked it was 94 in the morning and then 104 in the evening  HLD: manitained on atorvastatin 40mg  and ASA  Interstitial lung disease: currently maintained on adviar and folowed by pulmonlogy. He was on trelegy but it got expensiive. He is folloed by Faylene Million, MD  HTN: currently maintained on amlodipine 10, enalapril 20, imdur 30, and maxzide. States that he has been having dizziness and lightheadedness. States that BP was 90-60s. States that he did drinks some coffee and brought it up. States that it has been at rest and with movement. States that he is drinking plenty of fluid   GERD: on protonix 40mg  a day and is followed by [facility]. He has been doing it prn and then went to daily then back to PRN   for complete physical and follow up of chronic conditions.  Immunizations: -Tetanus: Completed in 2019 -Influenza: 01/23/2023 -Shingles: Completed Shingrix series -Pneumonia: Completed 2022  Diet: Fair diet. He is eating 2 meals a day with some snacks. He has unsweet tea and ginger ale. He will drink water with mix ins Exercise: No regular exercise.  Eye exam: Completes annually. Scheduled in a month  Dental exam: Completes semi-annually    Colonoscopy: Completed in  20/20/2023 [facility], recall in 5 years  Lung Cancer Screening: former smoker with 105 pack history been quit over 20 years   PSA: Due  Advance directive: he does have a living will   Sleep: goes to bed around 9 and will wake up around 530-630. Does not feel rested often. States that he has nocturia x1. Has tried melatonin that was not effective. Takes ibuprofen beofre bed and goes to bed       Review of Systems  Constitutional:   Negative for chills and fever.  Respiratory:  Positive for shortness of breath (DOE).   Cardiovascular:  Negative for chest pain and leg swelling.  Gastrointestinal:  Negative for abdominal pain, blood in stool, constipation, diarrhea, nausea and vomiting.       Bm daily   Genitourinary:  Negative for dysuria and hematuria.       Weaker stream  Neurological:  Negative for tingling and headaches.  Psychiatric/Behavioral:  Negative for hallucinations and suicidal ideas.       Objective:     BP 122/60   Pulse 76   Temp 98 F (36.7 C) (Oral)   Ht 5\' 7"  (1.702 m)   Wt 199 lb 3.2 oz (90.4 kg)   SpO2 96%   BMI 31.20 kg/m  BP Readings from Last 3 Encounters:  05/30/23 122/60  12/26/22 (!) 144/75  11/21/22 120/60   Wt Readings from Last 3 Encounters:  05/30/23 199 lb 3.2 oz (90.4 kg)  05/03/23 204 lb (92.5 kg)  12/26/22 204 lb 12.8 oz (92.9 kg)   SpO2 Readings from Last 3 Encounters:  05/30/23 96%  12/26/22 96%  11/21/22 93%      Physical Exam Vitals and nursing note reviewed.  Constitutional:      Appearance: Normal appearance.  HENT:     Right Ear: Tympanic membrane, ear canal and external ear normal.     Left Ear: Tympanic membrane, ear canal  and external ear normal.     Mouth/Throat:     Mouth: Mucous membranes are moist.     Pharynx: Oropharynx is clear.  Eyes:     Extraocular Movements: Extraocular movements intact.     Pupils: Pupils are equal, round, and reactive to light.  Cardiovascular:     Rate and Rhythm: Normal rate and regular rhythm.     Pulses: Normal pulses.     Heart sounds: Normal heart sounds.  Pulmonary:     Effort: Pulmonary effort is normal.     Breath sounds: Normal breath sounds.  Abdominal:     General: Bowel sounds are normal. There is no distension.     Palpations: There is no mass.     Tenderness: There is no abdominal tenderness.     Hernia: No hernia is present.  Musculoskeletal:     Right lower leg: No edema.     Left lower  leg: No edema.  Lymphadenopathy:     Cervical: No cervical adenopathy.  Skin:    General: Skin is warm.  Neurological:     General: No focal deficit present.     Mental Status: He is alert.     Deep Tendon Reflexes:     Reflex Scores:      Bicep reflexes are 2+ on the right side and 2+ on the left side.      Patellar reflexes are 2+ on the right side and 2+ on the left side.    Comments: Bilateral upper and lower extremity strength 5/5  Psychiatric:        Mood and Affect: Mood normal.        Behavior: Behavior normal.        Thought Content: Thought content normal.        Judgment: Judgment normal.      Results for orders placed or performed in visit on 05/30/23  POCT glycosylated hemoglobin (Hb A1C)  Result Value Ref Range   Hemoglobin A1C 6.0 (A) 4.0 - 5.6 %   HbA1c POC (<> result, manual entry)     HbA1c, POC (prediabetic range)     HbA1c, POC (controlled diabetic range)        The 10-year ASCVD risk score (Arnett DK, et al., 2019) is: 41.7%    Assessment & Plan:   Problem List Items Addressed This Visit       Cardiovascular and Mediastinum   Essential hypertension   Patient currently maintained on amlodipine 10 mg, enalapril 20 mg, Imdur 30 mg, and Maxzide.  Patient having hypotension at home along with some lightheadedness or dizziness.  Will discontinue amlodipine 10 mg and reduce that to amlodipine 5 mg.  Follow-up 1 month      Relevant Medications   amLODipine (NORVASC) 5 MG tablet   Other Relevant Orders   CBC   Comprehensive metabolic panel   Lipid panel   TSH     Respiratory   ILD (interstitial lung disease) (HCC)   Follow-up pulmonology on Advair.  Continue        Endocrine   Type 2 diabetes mellitus with neurological complications (HCC)   Patient currently maintained on metformin 500 mg twice daily.  A1c.  If reduction blood pressure medications affect lightheaded or dizziness consider reducing the amount of metformin      Relevant Orders    POCT glycosylated hemoglobin (Hb A1C) (Completed)   CBC   Comprehensive metabolic panel   Lipid panel   Microalbumin / creatinine urine ratio  Vitamin B12   Hyperlipidemia associated with type 2 diabetes mellitus (HCC)   Patient currently maintained atorvastatin 40 mg and ASA.  Pending lipid panel      Relevant Medications   amLODipine (NORVASC) 5 MG tablet   Other Relevant Orders   Lipid panel     Other   Former smoker   Does not qualify for LDCT.  Pending urine microscopy rule out microscopic hematuria      Relevant Orders   Urine Microscopic   Preventative health care - Primary   Discussed age-appropriate immunizations and screening exams.  Did review patient's personal, surgical, social, family histories.  Patient up-to-date on all age-appropriate vaccinations.  Patient up-to-date on CRC screening.  PSA today for prostate cancer screening.  Patient was given information at discharge about having healthcare maintenance with physical guidance.      Current use of proton pump inhibitor   Patient having some cramping when using GI metformin check magnesium and B12 patient should continue adequate hydration      Relevant Orders   Magnesium   Other Visit Diagnoses       Obesity (BMI 30-39.9)         Screening for prostate cancer       Relevant Orders   PSA, Medicare       Return in about 4 weeks (around 06/27/2023) for BP recheck/lightheadedness.    Audria Nine, NP

## 2023-05-30 NOTE — Assessment & Plan Note (Signed)
 Follow-up pulmonology on Advair.  Continue

## 2023-05-30 NOTE — Assessment & Plan Note (Signed)
 Patient currently maintained on amlodipine 10 mg, enalapril 20 mg, Imdur 30 mg, and Maxzide.  Patient having hypotension at home along with some lightheadedness or dizziness.  Will discontinue amlodipine 10 mg and reduce that to amlodipine 5 mg.  Follow-up 1 month

## 2023-05-30 NOTE — Assessment & Plan Note (Signed)
 Patient having some cramping when using GI metformin check magnesium and B12 patient should continue adequate hydration

## 2023-05-30 NOTE — Assessment & Plan Note (Signed)
 Does not qualify for LDCT.  Pending urine microscopy rule out microscopic hematuria

## 2023-05-31 ENCOUNTER — Encounter: Payer: Self-pay | Admitting: Nurse Practitioner

## 2023-06-27 ENCOUNTER — Ambulatory Visit (INDEPENDENT_AMBULATORY_CARE_PROVIDER_SITE_OTHER): Admitting: Nurse Practitioner

## 2023-06-27 VITALS — BP 120/60 | HR 65 | Temp 97.7°F | Ht 67.0 in | Wt 199.4 lb

## 2023-06-27 DIAGNOSIS — N289 Disorder of kidney and ureter, unspecified: Secondary | ICD-10-CM | POA: Diagnosis not present

## 2023-06-27 DIAGNOSIS — R42 Dizziness and giddiness: Secondary | ICD-10-CM | POA: Diagnosis not present

## 2023-06-27 DIAGNOSIS — I10 Essential (primary) hypertension: Secondary | ICD-10-CM | POA: Diagnosis not present

## 2023-06-27 DIAGNOSIS — E1149 Type 2 diabetes mellitus with other diabetic neurological complication: Secondary | ICD-10-CM | POA: Diagnosis not present

## 2023-06-27 DIAGNOSIS — Z7984 Long term (current) use of oral hypoglycemic drugs: Secondary | ICD-10-CM | POA: Diagnosis not present

## 2023-06-27 LAB — BASIC METABOLIC PANEL WITH GFR
BUN: 16 mg/dL (ref 6–23)
CO2: 30 meq/L (ref 19–32)
Calcium: 9.8 mg/dL (ref 8.4–10.5)
Chloride: 102 meq/L (ref 96–112)
Creatinine, Ser: 1.1 mg/dL (ref 0.40–1.50)
GFR: 66.3 mL/min (ref >60.00)
Glucose, Bld: 127 mg/dL — ABNORMAL HIGH (ref 70–99)
Potassium: 4.8 meq/L (ref 3.5–5.1)
Sodium: 139 meq/L (ref 135–145)

## 2023-06-27 NOTE — Progress Notes (Signed)
 Established Patient Office Visit  Subjective   Patient ID: Christopher Oconnell, male    DOB: [birth year]  Age: 73 y.o. MRN: [id number]  Chief Complaint  Patient presents with   Follow-up    Bp reading this morning was 126/67. Pt states bp has been running low recently. States he feels lightheaded sometimes.     HPI  HTN: patient was seen by me on 05/30/2023 for cpe and was having hypotension at home and lightheadedness. He was on enalapril 20mg , imdur 30mg , maxzide and amlodipine. I reduced the amlodipine form 10mg  to 5mg  and he is here for a follow up. He is also a diabetic that could be affecting him. States that Tuesday mornin ghe felt light headed and dizzzy. States that he check Bp and the bottom number was 59 and the top was bleow 100.  He has had the episodes approx 2 times since reducing the dose of the amlodipine. This is an improvement since the last office visit  He mentions that he is not taking sny vitmains outside of B12,  magnesium, over the counter potassium.   Review of Systems  Constitutional:  Negative for chills and fever.  Respiratory:  Positive for shortness of breath (baseline).   Cardiovascular:  Negative for chest pain.  Neurological:  Positive for dizziness. Negative for headaches.      Objective:     BP 120/60   Pulse 65   Temp 97.7 F (36.5 C) (Oral)   Ht 5\' 7"  (1.702 m)   Wt 199 lb 6.4 oz (90.4 kg)   SpO2 97%   BMI 31.23 kg/m  BP Readings from Last 3 Encounters:  06/27/23 120/60  05/30/23 122/60  12/26/22 (!) 144/75   Wt Readings from Last 3 Encounters:  06/27/23 199 lb 6.4 oz (90.4 kg)  05/30/23 199 lb 3.2 oz (90.4 kg)  05/03/23 204 lb (92.5 kg)   SpO2 Readings from Last 3 Encounters:  06/27/23 97%  05/30/23 96%  12/26/22 96%      Physical Exam Vitals and nursing note reviewed.  Constitutional:      Appearance: Normal appearance.  Cardiovascular:     Rate and Rhythm: Normal rate and regular rhythm.     Heart sounds: Normal  heart sounds.  Pulmonary:     Effort: Pulmonary effort is normal.     Breath sounds: Normal breath sounds.  Neurological:     Mental Status: He is alert.      No results found for any visits on 06/27/23.    The ASCVD Risk score (Arnett DK, et al., 2019) failed to calculate for the following reasons:   The valid total cholesterol range is 130 to 320 mg/dL    Assessment & Plan:   Problem List Items Addressed This Visit       Cardiovascular and Mediastinum   Essential hypertension - Primary   Patient currently maintained on Imdur 30, Maxide, enalapril 20 mg.  We will discontinue amlodipine altogether as patient is still experiencing lightheadedness.  Patient will check blood pressure at home.  Today's blood pressure was prior to any antihypertensives.  He has a follow-up with cardiology next week keep that.  Patient will check blood pressure at home and let me know if he exceeds the parameters given.        Endocrine   Type 2 diabetes mellitus with neurological complications [facility])   Patient still taking his metformin but he is having resolution of some of the symptoms do think this is  secondary to overmedication of blood pressure.  Continue metformin as prescribed        Other   Decreased renal function   Decreased renal function.  Patient is already on hydration recheck renal labs today      Relevant Orders   Basic metabolic panel with GFR   Lightheadedness   Improvement with reduction of amlodipine.  Will discontinue amlodipine altogether patient will continue checking blood pressure at home       Return in about 2 months (around 08/27/2023) for BP recheck.    Margarie Shay, NP

## 2023-06-27 NOTE — Assessment & Plan Note (Signed)
 Decreased renal function.  Patient is already on hydration recheck renal labs today

## 2023-06-27 NOTE — Assessment & Plan Note (Signed)
 Patient still taking his metformin but he is having resolution of some of the symptoms do think this is secondary to overmedication of blood pressure.  Continue metformin as prescribed

## 2023-06-27 NOTE — Patient Instructions (Signed)
 Nice to see you today We will discontinue the amlodipine Follow up with me in 2 months Check blood pressure at home  I want the top below 140 and the bottom below 90.

## 2023-06-27 NOTE — Assessment & Plan Note (Signed)
 Improvement with reduction of amlodipine.  Will discontinue amlodipine altogether patient will continue checking blood pressure at home

## 2023-06-27 NOTE — Assessment & Plan Note (Signed)
 Patient currently maintained on Imdur 30, Maxide, enalapril 20 mg.  We will discontinue amlodipine altogether as patient is still experiencing lightheadedness.  Patient will check blood pressure at home.  Today's blood pressure was prior to any antihypertensives.  He has a follow-up with cardiology next week keep that.  Patient will check blood pressure at home and let me know if he exceeds the parameters given.

## 2023-07-01 ENCOUNTER — Encounter: Payer: Self-pay | Admitting: Nurse Practitioner

## 2023-07-03 ENCOUNTER — Other Ambulatory Visit: Payer: Self-pay | Admitting: Nurse Practitioner

## 2023-07-03 ENCOUNTER — Other Ambulatory Visit: Payer: Self-pay | Admitting: Internal Medicine

## 2023-07-03 DIAGNOSIS — E785 Hyperlipidemia, unspecified: Secondary | ICD-10-CM

## 2023-07-05 ENCOUNTER — Ambulatory Visit: Payer: PPO | Attending: Internal Medicine | Admitting: Internal Medicine

## 2023-07-05 VITALS — BP 146/68 | HR 66 | Ht 68.0 in | Wt 199.2 lb

## 2023-07-05 DIAGNOSIS — I25118 Atherosclerotic heart disease of native coronary artery with other forms of angina pectoris: Secondary | ICD-10-CM

## 2023-07-05 DIAGNOSIS — J849 Interstitial pulmonary disease, unspecified: Secondary | ICD-10-CM | POA: Diagnosis not present

## 2023-07-05 DIAGNOSIS — E785 Hyperlipidemia, unspecified: Secondary | ICD-10-CM

## 2023-07-05 DIAGNOSIS — R079 Chest pain, unspecified: Secondary | ICD-10-CM

## 2023-07-05 DIAGNOSIS — E1169 Type 2 diabetes mellitus with other specified complication: Secondary | ICD-10-CM | POA: Diagnosis not present

## 2023-07-05 DIAGNOSIS — I2510 Atherosclerotic heart disease of native coronary artery without angina pectoris: Secondary | ICD-10-CM

## 2023-07-05 DIAGNOSIS — I10 Essential (primary) hypertension: Secondary | ICD-10-CM | POA: Diagnosis not present

## 2023-07-05 DIAGNOSIS — I25110 Atherosclerotic heart disease of native coronary artery with unstable angina pectoris: Secondary | ICD-10-CM | POA: Diagnosis not present

## 2023-07-05 DIAGNOSIS — I200 Unstable angina: Secondary | ICD-10-CM

## 2023-07-05 NOTE — Progress Notes (Signed)
 Cardiology Office Note:  .   Date:  07/06/2023  ID:  ATIKSH GANDEE, DOB 1950-06-24, MRN [id number] PCP: Dorothe Gaster, NP  [facility] Providers Cardiologist:  Sammy Crisp, MD     History of Present Illness: .   Christopher Oconnell is a 73 y.o. male with history of coronary artery disease status post angioplasty to D1 in 1993, stroke, hypertension, hyperlipidemia, type 2 diabetes mellitus, and interstitial lung disease, who presents for follow-up of coronary artery disease.  I last saw him in 12/2018 for, at which time he was feeling better after adjustment of his pulmonary treatments for his ILD.  He noted infrequent chest discomfort when overdoing it.  We discussed pursuing ischemia testing though Mr. Kareen Osier ultimately declined, wishing to continue his lung therapy to see if his symptoms would continue to improve.  Today, Mr. Arrick reports that he continues to have dyspnea on exertion and associated tightness in the chest that has gotten gradually worse since our last visit in June.  He is currently only using Advair but is concerned that heart disease may be contributing to some of his symptoms.  He typically notices his discomfort and dyspnea when walking up an incline, though he noticed some mild shortness of breath walking across the parking lot to our office today.  He has not had any dyspnea or chest pain at rest.  He has occasional lightheadedness, which has resolved since he was weaned off amlodipine  by [name], citing blood pressures as low as 97/59 at home while on amlodipine .  This seems to have helped his dizziness.  He denies edema.  Mr. Shahan reports that his home blood pressures are typically better than today's reading.  ROS: See HPI  Studies Reviewed: Aaron Aas   EKG Interpretation Date/Time:  Friday July 05 2023 08:34:02 EDT Ventricular Rate:  66 PR Interval:  144 QRS Duration:  82 QT Interval:  382 QTC Calculation: 400 R Axis:   0  Text  Interpretation: Sinus rhythm with Premature supraventricular complexes T wave abnormality, consider inferior ischemia Abnormal ECG When compared with ECG of 30-Aug-2022 Premature supraventricular complexes are now Present Confirmed by Zamya Culhane, Veryl Gottron [phone]) on 07/06/2023 6:40:12 PM    Risk Assessment/Calculations:     HYPERTENSION CONTROL Vitals:   07/05/23 0826 07/05/23 0852  BP: (!) 142/70 (!) 146/68    The patient's blood pressure is elevated above target today.  In order to address the patient's elevated BP: Blood pressure will be monitored at home to determine if medication changes need to be made.          Physical Exam:   VS:  BP (!) 146/68 (BP Location: Left Arm, Cuff Size: Normal)   Pulse 66   Ht 5\' 8"  (1.727 m)   Wt 199 lb 3.2 oz (90.4 kg)   SpO2 97%   BMI 30.29 kg/m    Wt Readings from Last 3 Encounters:  07/05/23 199 lb 3.2 oz (90.4 kg)  06/27/23 199 lb 6.4 oz (90.4 kg)  05/30/23 199 lb 3.2 oz (90.4 kg)    General:  NAD. Neck: No JVD or HJR. Lungs: Clear to auscultation bilaterally without wheezes or crackles. Heart: Regular rate and rhythm without murmurs, rubs, or gallops. Abdomen: Soft, nontender, nondistended. Extremities: No lower extremity edema.  ASSESSMENT AND PLAN: .    Coronary artery disease with accelerating angina: Mr. Carrara has a history of CAD with remote PTCA of a diagonal branch.  He reports progressive exertional dyspnea and chest  tightness that resolves promptly with rest over the last year.  He has not had any symptoms at rest.  I am concerned that he is experiencing accelerating angina.  We discussed further evaluation options and have agreed to pursue a myocardial PET/CT, which will offer better sensitivity and specificity over a SPECT study given his history of CAD with prior intervention as well as diabetes mellitus.  We will defer medication changes in the meantime, continuing antianginal therapy with isosorbide  mononitrate 30 mg  daily..  Interstitial lung disease: Currently only on Advair.  Continue this with ongoing management per pulmonology.  Hyperlipidemia associated with type 2 diabetes mellitus: LDL reasonable at 64 on last lipid panel in March.  Continue atorvastatin  40 mg daily as well as fish oil.  Hypertension: Blood pressure mildly elevated today but typically better at home.  In fact, Mr. Vandecar reports some symptomatic borderline hypotension prompting cessation of amlodipine .  I will defer medication changes today but have asked Mr. Kareen Osier to keep monitoring his blood pressure at home.  If it trends up and is consistently above 130/80, we may need to consider increasing one of his current medications such as enalapril  or isosorbide  mononitrate versus resuming a lower dose amlodipine .    Informed Consent   Shared Decision Making/Informed Consent The risks [chest pain, shortness of breath, cardiac arrhythmias, dizziness, blood pressure fluctuations, myocardial infarction, stroke/transient ischemic attack, nausea, vomiting, allergic reaction, radiation exposure, metallic taste sensation and life-threatening complications (estimated to be 1 in 10,000)], benefits (risk stratification, diagnosing coronary artery disease, treatment guidance) and alternatives of a cardiac PET stress test were discussed in detail with Mr. Lajara and he agrees to proceed.     Dispo: Return to clinic in 1 month.  Signed, Sammy Crisp, MD

## 2023-07-05 NOTE — Patient Instructions (Addendum)
 Medication Instructions:  Your Physician recommend you continue on your current medication as directed.    *If you need a refill on your cardiac medications before your next appointment, please call your pharmacy*  Testing/Procedures:   Please report to Radiology at [facility] Main Entrance, medical mall, 30 mins prior to your test.  [address]  [place], [place]  How to Prepare for Your Cardiac PET/CT Stress Test:  Nothing to eat or drink, except water, 3 hours prior to arrival time.  NO caffeine/decaffeinated products, or chocolate 12 hours prior to arrival. (Please note decaffeinated beverages (teas/coffees) still contain caffeine).  If you have caffeine within 12 hours prior, the test will need to be rescheduled.  Medication instructions: Do not take erectile dysfunction medications for 72 hours prior to test (sildenafil, tadalafil) Do not take nitrates (isosorbide  mononitrate) the day before or day of test Do not take tamsulosin the day before or morning of test Hold theophylline containing medications for 12 hours. Hold Dipyridamole 48 hours prior to the test.  Diabetic Preparation: If able to eat breakfast prior to 3 hour fasting, you may take all medications, including your insulin.  Do not worry if you miss your breakfast dose of insulin - start at your next meal. If you do not eat prior to 3 hour fast-Hold all diabetes (oral and insulin) medications. (HOLD METFORMIN )  Patients who wear a continuous glucose monitor MUST remove the device prior to scanning.  You may take your remaining medications with water.  NO perfume, cologne or lotion on chest or abdomen area. FEMALES - Please avoid wearing dresses to this appointment.  Total time is 1 to 2 hours; you may want to bring reading material for the waiting time.  IF YOU THINK YOU MAY BE PREGNANT, OR ARE NURSING PLEASE INFORM THE TECHNOLOGIST.  In preparation for your appointment, medication  and supplies will be purchased.  Appointment availability is limited, so if you need to cancel or reschedule, please call the Radiology Department Scheduler at [phone] 24 hours in advance to avoid a cancellation fee of $100.00  What to Expect When you Arrive:  Once you arrive and check in for your appointment, you will be taken to a preparation room within the Radiology Department.  A technologist or Nurse will obtain your medical history, verify that you are correctly prepped for the exam, and explain the procedure.  Afterwards, an IV will be started in your arm and electrodes will be placed on your skin for EKG monitoring during the stress portion of the exam. Then you will be escorted to the PET/CT scanner.  There, staff will get you positioned on the scanner and obtain a blood pressure and EKG.  During the exam, you will continue to be connected to the EKG and blood pressure machines.  A small, safe amount of a radioactive tracer will be injected in your IV to obtain a series of pictures of your heart along with an injection of a stress agent.    After your Exam:  It is recommended that you eat a meal and drink a caffeinated beverage to counter act any effects of the stress agent.  Drink plenty of fluids for the remainder of the day and urinate frequently for the first couple of hours after the exam.  Your doctor will inform you of your test results within 7-10 business days.  For more information and frequently asked questions, please visit our website: [email or web address]  For questions about your test  or how to prepare for your test, please call: Cardiac Imaging Nurse Navigators Office: [phone]   Follow-Up: At [facility], you and your health needs are our priority.  As part of our continuing mission to provide you with exceptional heart care, our providers are all part of one team.  This team includes your primary Cardiologist (physician) and Advanced  Practice Providers or APPs (Physician Assistants and Nurse Practitioners) who all work together to provide you with the care you need, when you need it.  Your next appointment:   1 month(s)  Provider:   You may see Sammy Crisp, MD or one of the following Advanced Practice Providers on your designated Care Team:   Laneta Pintos, NP Gildardo Labrador, PA-C Varney Gentleman, PA-C Cadence Nederland, PA-C Ronald Cockayne, NP Morey Ar, NP    We recommend signing up for the patient portal called "MyChart".  Sign up information is provided on this After Visit Summary.  MyChart is used to connect with patients for Virtual Visits (Telemedicine).  Patients are able to view lab/test results, encounter notes, upcoming appointments, etc.  Non-urgent messages can be sent to your provider as well.   To learn more about what you can do with MyChart, go to [email or web address].   Other Instructions

## 2023-07-06 ENCOUNTER — Encounter: Payer: Self-pay | Admitting: Internal Medicine

## 2023-07-06 DIAGNOSIS — I200 Unstable angina: Secondary | ICD-10-CM | POA: Insufficient documentation

## 2023-07-17 DIAGNOSIS — H2513 Age-related nuclear cataract, bilateral: Secondary | ICD-10-CM | POA: Diagnosis not present

## 2023-07-17 DIAGNOSIS — E119 Type 2 diabetes mellitus without complications: Secondary | ICD-10-CM | POA: Diagnosis not present

## 2023-07-17 LAB — HM DIABETES EYE EXAM

## 2023-07-31 DIAGNOSIS — H2512 Age-related nuclear cataract, left eye: Secondary | ICD-10-CM | POA: Diagnosis not present

## 2023-07-31 DIAGNOSIS — H2511 Age-related nuclear cataract, right eye: Secondary | ICD-10-CM | POA: Diagnosis not present

## 2023-08-07 ENCOUNTER — Ambulatory Visit: Admitting: Physician Assistant

## 2023-08-13 ENCOUNTER — Encounter (HOSPITAL_COMMUNITY): Payer: Self-pay

## 2023-08-15 ENCOUNTER — Ambulatory Visit
Admission: RE | Admit: 2023-08-15 | Discharge: 2023-08-15 | Disposition: A | Discharge status: Home / Self Care | Source: Ambulatory Visit | Attending: Internal Medicine | Admitting: Internal Medicine

## 2023-08-15 DIAGNOSIS — J8489 Other specified interstitial pulmonary diseases: Secondary | ICD-10-CM | POA: Diagnosis not present

## 2023-08-15 DIAGNOSIS — I200 Unstable angina: Secondary | ICD-10-CM

## 2023-08-15 DIAGNOSIS — I700 Atherosclerosis of aorta: Secondary | ICD-10-CM | POA: Diagnosis not present

## 2023-08-15 DIAGNOSIS — K449 Diaphragmatic hernia without obstruction or gangrene: Secondary | ICD-10-CM | POA: Diagnosis not present

## 2023-08-15 DIAGNOSIS — J479 Bronchiectasis, uncomplicated: Secondary | ICD-10-CM | POA: Diagnosis not present

## 2023-08-15 MED ORDER — RUBIDIUM RB82 GENERATOR (RUBYFILL)
25.0000 | PACK | Freq: Once | INTRAVENOUS | Status: AC
  Administered 2023-08-15: 23.47 via INTRAVENOUS

## 2023-08-15 MED ORDER — REGADENOSON 0.4 MG/5ML IV SOLN
0.4000 mg | Freq: Once | INTRAVENOUS | Status: AC
  Administered 2023-08-15: 0.4 mg via INTRAVENOUS
  Filled 2023-08-15: qty 5

## 2023-08-15 MED ORDER — RUBIDIUM RB82 GENERATOR (RUBYFILL)
25.0000 | PACK | Freq: Once | INTRAVENOUS | Status: AC
  Administered 2023-08-15: 23.42 via INTRAVENOUS

## 2023-08-16 ENCOUNTER — Ambulatory Visit: Payer: Self-pay | Admitting: Internal Medicine

## 2023-08-16 LAB — NM PET CT CARDIAC PERFUSION MULTI W/ABSOLUTE BLOODFLOW
LV dias vol: 98 mL (ref 62–150)
LV sys vol: 43 mL
MBFR: 1.92
Nuc Rest EF: 45 %
Nuc Stress EF: 58 %
Peak HR: 86 {beats}/min
Rest HR: 67 {beats}/min
Rest MBF: 0.63 ml/g/min
Rest Nuclear Isotope Dose: 23.4 mCi
SRS: 4
SSS: 22
ST Depression (mm): 0 mm
Stress MBF: 1.21 ml/g/min
Stress Nuclear Isotope Dose: 23.5 mCi
TID: 1.03

## 2023-08-21 NOTE — H&P (View-Only) (Signed)
 Cardiology Office Note    Date:  08/22/2023   ID:  Christopher Oconnell, DOB 1950-09-11, MRN [id number]  PCP:  Dorothe Gaster, NP  Cardiologist:  Sammy Crisp, MD  Electrophysiologist:  None   Chief Complaint: Follow up  History of Present Illness:   Christopher Oconnell is a 73 y.o. male with history of CAD status post PTCA to D1 in 1993, CVA, DM2, ILD, HTN, and HLD who presents for follow-up of myocardial PET/CT.  He was previously followed by Dr. Anastasia Balo with transition in his care to Dr. Nolan Battle in 02/2019.  Recently evaluated in the office in 06/2023 noting progression of longstanding exertional dyspnea with associated chest tightness.  Headedness that resolved after discontinuing amlodipine  in setting of soft BP in the 90s over 50s at home while on amlodipine .  EKG showed sinus rhythm with PACs and T wave abnormality in the leads.  He underwent myocardial PET/CT on 08/15/2023 that showed a medium defect with moderate reduction in uptake present in the apical to basal anterior and anterolateral locations that was partially reversible.  Normal wall motion in the defect area.'s were consistent with ischemia.  There was a second defect present in the apical to basal inferior locations that was reversible with abnormal wall motion in the defect area that was also consistent with ischemia.  Rest EF of 45% with a stress EF of 58%.  Overall, findings were consistent with ischemia and this was a high risk study.  He comes in today accompanied by his wife and continues to note intermittent exertional chest tightness that is unchanged when compared to his prior visit.  Symptoms improved with rest.  He does continue to feel like his chronic dyspnea is related to underlying pulmonary disease.  No dizziness, presyncope, or syncope.  No falls or symptoms concerning for bleeding.   Labs independently reviewed: 06/2023 - potassium 4.8, BUN 16, serum creatinine 1.1 05/2023 - magnesium 1.7, TSH normal, TC 125, TG  115, HDL 38, LDL 64, albumin 4.7, AST/ALT normal, Hgb 14, PLT 299  Past Medical History:  Diagnosis Date   Arthritis    Asthma    Atypical mole 10/19/2020   R med bicep, mod to severe, exc 11/08/20   Basal cell carcinoma 11/05/2019   Right ear above crus. Nodular and infiltrative patterns. Excised 01/19/2020, margins free.   Coronary artery disease    Diabetes mellitus without complication (HCC)    Dysplastic nevus 11/08/2020   left med bicep at junction of L deltoid - moderate   Dysplastic nevus 11/08/2020   left med bicep at junction of L deltoid, moderate atypia   GERD (gastroesophageal reflux disease)    Hyperlipidemia    Hypertension     Past Surgical History:  Procedure Laterality Date   CORONARY ANGIOPLASTY     No stents   NOSE SURGERY     SKIN CANCER EXCISION  01/2020   right ear    TONSILLECTOMY      Current Medications: Current Meds  Medication Sig   aspirin EC 81 MG tablet Take 81 mg by mouth daily.   atorvastatin  (LIPITOR) 40 MG tablet TAKE 1 TABLET BY MOUTH EVERY DAY   CINNAMON PO Take 200 mg by mouth daily as needed.   enalapril  (VASOTEC ) 20 MG tablet TAKE 1 TABLET BY MOUTH EVERY DAY   fluticasone-salmeterol (ADVAIR) 250-50 MCG/ACT AEPB Inhale into the lungs.   isosorbide  mononitrate (IMDUR ) 30 MG 24 hr tablet TAKE 1 TABLET BY MOUTH EVERY DAY  metFORMIN  (GLUCOPHAGE ) 500 MG tablet TAKE 1 TABLET BY MOUTH TWICE A DAY WITH FOOD   Multiple Vitamin (MULTIVITAMIN) tablet Take 1 tablet by mouth daily as needed.   nitroGLYCERIN  (NITROSTAT ) 0.4 MG SL tablet Place 1 tablet (0.4 mg total) under the tongue every 5 (five) minutes as needed for chest pain.   Omega-3 Fatty Acids (FISH OIL PO) Take 1,200 mg by mouth daily as needed. Takes 1 tablet   pantoprazole (PROTONIX) 40 MG tablet Take 40 mg by mouth daily as needed.   Potassium Gluconate 550 MG TABS Take by mouth daily as needed.   triamterene -hydrochlorothiazide (MAXZIDE-25) 37.5-25 MG tablet TAKE 1 TABLET BY MOUTH  EVERY DAY IN THE MORNING   Turmeric (QC TUMERIC COMPLEX PO) Take by mouth daily.    Allergies:   Patient has no known allergies.   Social History   Socioeconomic History   Marital status: Married    Spouse name: [name]   Number of children: 0   Years of education: [employer] school   Highest education level: Associate degree: occupational, Scientist, product/process development, or vocational program  Occupational History   Not on file  Tobacco Use   Smoking status: Former    Current packs/day: 0.00    Average packs/day: 3.5 packs/day for 30.0 years (105.0 ttl pk-yrs)    Types: Cigarettes    Start date: 1963    Quit date: 1993    Years since quitting: 32.4   Smokeless tobacco: Never  Vaping Use   Vaping status: Never Used  Substance and Sexual Activity   Alcohol use: Not Currently    Comment: 15 beers on the weekends   Drug use: Never   Sexual activity: Not Currently  Other Topics Concern   Not on file  Social History Narrative   07/30/19   From: the area   Living: with wife, Stephenie Einstein (1986, together since 1981)   Work: Retired, Curator work in 2013      Family: sisters live nearby, and in-laws are in [place]      Enjoys: hunting, fishing, golf (occasionally)      Exercise: daily activities, yard work   Diet: meat - primarily venison, salads, fish, chicken - no red meat      Safety   Seat belts: Yes    Guns: Yes  and not currently secure   Safe in relationships: Yes    Social Drivers of Corporate investment banker Strain: Patient Declined (05/02/2023)   Overall Financial Resource Strain (CARDIA)    Difficulty of Paying Living Expenses: Patient declined  Food Insecurity: Patient Declined (05/02/2023)   Hunger Vital Sign    Worried About Running Out of Food in the Last Year: Patient declined    Ran Out of Food in the Last Year: Patient declined  Transportation Needs: No Transportation Needs (05/02/2023)   PRAPARE - Administrator, Civil Service (Medical): No    Lack of  Transportation (Non-Medical): No  Physical Activity: Unknown (05/02/2023)   Exercise Vital Sign    Days of Exercise per Week: Patient declined    Minutes of Exercise per Session: Not on file  Stress: No Stress Concern Present (05/02/2023)   [employer] of Occupational Health - Occupational Stress Questionnaire    Feeling of Stress : Not at all  Social Connections: Unknown (05/02/2023)   Social Connection and Isolation Panel    Frequency of Communication with Friends and Family: Once a week    Frequency of Social Gatherings with Friends and Family: Once  a week    Attends Religious Services: Patient declined    Active Member of Clubs or Organizations: No    Attends Engineer, structural: Not on file    Marital Status: Married     Family History:  The patient's family history includes Breast cancer in his sister and sister; Cancer in his maternal grandmother; Coronary artery disease in his father; Dementia in his mother; Drug abuse in his sister; Heart attack in his paternal grandfather and paternal grandmother; Melanoma in his father; Pancreatic cancer in his father.  ROS:   12-point review of systems is negative unless otherwise noted in the HPI.   EKGs/Labs/Other Studies Reviewed:    Studies reviewed were summarized above. The additional studies were reviewed today:  Myocardial PET/CT 08/15/2023:   LV perfusion is abnormal. There is evidence of ischemia. Defect 1: There is a medium defect with moderate reduction in uptake present in the apical to basal anterior and anterolateral location(s) that is partially reversible. There is normal wall motion in the defect area. Consistent with ischemia. Defect 2: There is a medium defect with absent uptake present in the apical to basal inferior location(s) that is reversible. There is abnormal wall motion in the defect area. Consistent with ischemia.   Rest left ventricular function is abnormal. Rest global function is mildly reduced.  There was a single regional abnormality. Rest EF: 45%. Stress left ventricular function is normal. Stress EF: 58%. End diastolic cavity size is normal.   Myocardial blood flow was computed to be 0.56ml/g/min at rest and 1.21ml/g/min at stress. Global myocardial blood flow reserve was 1.92 and was mildly abnormal.   Coronary calcium  was present on the attenuation correction CT images. Severe coronary calcifications were present. Coronary calcifications were present in the left anterior descending artery, left circumflex artery and right coronary artery distribution(s).   Findings are consistent with ischemia. The study is high risk.   EKG:  EKG is ordered today.  The EKG ordered today demonstrates NSR, 73 bpm, inferior T wave inversion consistent with prior tracing  Recent Labs: 05/30/2023: ALT 16; Hemoglobin 14.0; Magnesium 1.7; Platelets 299.0; TSH 1.18 06/27/2023: BUN 16; Creatinine, Ser 1.10; Potassium 4.8 Slight hemolysis seen; Sodium 139  Recent Lipid Panel    Component Value Date/Time   CHOL 125 05/30/2023 0923   TRIG 115.0 05/30/2023 0923   HDL 38.40 (L) 05/30/2023 0923   CHOLHDL 3 05/30/2023 0923   VLDL 23.0 05/30/2023 0923   LDLCALC 64 05/30/2023 0923   LDLDIRECT 90.0 05/21/2022 1513    PHYSICAL EXAM:    VS:  BP 120/60 (BP Location: Left Arm, Patient Position: Sitting)   Pulse 73   Ht 5' 8 (1.727 m)   Wt 204 lb 9.6 oz (92.8 kg)   SpO2 97%   BMI 31.11 kg/m   BMI: Body mass index is 31.11 kg/m.  Physical Exam Vitals reviewed.  Constitutional:      Appearance: He is well-developed.  HENT:     Head: Normocephalic and atraumatic.   Eyes:     General:        Right eye: No discharge.        Left eye: No discharge.    Cardiovascular:     Rate and Rhythm: Normal rate and regular rhythm.     Heart sounds: Normal heart sounds, S1 normal and S2 normal. Heart sounds not distant. No midsystolic click and no opening snap. No murmur heard.    No friction rub.  Pulmonary:  Effort: Pulmonary effort is normal. No respiratory distress.     Breath sounds: Normal breath sounds. No decreased breath sounds, wheezing or rales.     Comments: Very faint coarse breath sounds along the bilateral bases.  Chest:     Chest wall: No tenderness.   Musculoskeletal:     Cervical back: Normal range of motion.     Right lower leg: No edema.     Left lower leg: No edema.   Skin:    General: Skin is warm and dry.     Nails: There is no clubbing.   Neurological:     Mental Status: He is alert and oriented to person, place, and time.   Psychiatric:        Speech: Speech normal.        Behavior: Behavior normal.        Thought Content: Thought content normal.        Judgment: Judgment normal.     Wt Readings from Last 3 Encounters:  08/22/23 204 lb 9.6 oz (92.8 kg)  07/05/23 199 lb 3.2 oz (90.4 kg)  06/27/23 199 lb 6.4 oz (90.4 kg)     ASSESSMENT & PLAN:   CAD involving the native coronary arteries with exertional angina: Currently without symptoms of angina or cardiac decompensation.  Recent myocardial PET/CT high risk as outlined above.  Schedule LHC with primary cardiologist.  Patient does agree to rescind DNR for cardiac catheterization.  Continue aggressive risk factor modification and secondary prevention including aspirin 81 mg, Imdur  30 mg, and atorvastatin  40 mg.  Obtain echo to confirm EF noted on myocardial PET/CT.  HTN: Blood pressure is well-controlled in the office today.  He remains on enalapril  20 mg, Imdur  30 mg, and Maxide 25.  Followed by PCP.  HLD: LDL 64 in 05/2023 with normal AST/ALT at that time.  He remains on atorvastatin  40 mg.  ILD: Per pulmonology.   Informed Consent   Shared Decision Making/Informed Consent{  The risks [stroke (1 in 1000), death (1 in 1000), kidney failure [usually temporary] (1 in 500), bleeding (1 in 200), allergic reaction [possibly serious] (1 in 200)], benefits (diagnostic support and management of coronary artery  disease) and alternatives of a cardiac catheterization were discussed in detail with Mr. Oyama and he is willing to proceed.        Disposition: F/u with Dr. Nolan Battle or an APP in 2-3 weeks.   Medication Adjustments/Labs and Tests Ordered: Current medicines are reviewed at length with the patient today.  Concerns regarding medicines are outlined above. Medication changes, Labs and Tests ordered today are summarized above and listed in the Patient Instructions accessible in Encounters.   Signed, Varney Gentleman, PA-C 08/22/2023 1:22 PM     [facility] [place] [address] [place], [place] [address] [phone]

## 2023-08-21 NOTE — Progress Notes (Signed)
 Cardiology Office Note    Date:  08/22/2023   ID:  Christopher Oconnell, DOB 1950-09-11, MRN [id number]  PCP:  Dorothe Gaster, NP  Cardiologist:  Sammy Crisp, MD  Electrophysiologist:  None   Chief Complaint: Follow up  History of Present Illness:   Christopher Oconnell is a 73 y.o. male with history of CAD status post PTCA to D1 in 1993, CVA, DM2, ILD, HTN, and HLD who presents for follow-up of myocardial PET/CT.  He was previously followed by Dr. Anastasia Balo with transition in his care to Dr. Nolan Battle in 02/2019.  Recently evaluated in the office in 06/2023 noting progression of longstanding exertional dyspnea with associated chest tightness.  Headedness that resolved after discontinuing amlodipine  in setting of soft BP in the 90s over 50s at home while on amlodipine .  EKG showed sinus rhythm with PACs and T wave abnormality in the leads.  He underwent myocardial PET/CT on 08/15/2023 that showed a medium defect with moderate reduction in uptake present in the apical to basal anterior and anterolateral locations that was partially reversible.  Normal wall motion in the defect area.'s were consistent with ischemia.  There was a second defect present in the apical to basal inferior locations that was reversible with abnormal wall motion in the defect area that was also consistent with ischemia.  Rest EF of 45% with a stress EF of 58%.  Overall, findings were consistent with ischemia and this was a high risk study.  He comes in today accompanied by his wife and continues to note intermittent exertional chest tightness that is unchanged when compared to his prior visit.  Symptoms improved with rest.  He does continue to feel like his chronic dyspnea is related to underlying pulmonary disease.  No dizziness, presyncope, or syncope.  No falls or symptoms concerning for bleeding.   Labs independently reviewed: 06/2023 - potassium 4.8, BUN 16, serum creatinine 1.1 05/2023 - magnesium 1.7, TSH normal, TC 125, TG  115, HDL 38, LDL 64, albumin 4.7, AST/ALT normal, Hgb 14, PLT 299  Past Medical History:  Diagnosis Date   Arthritis    Asthma    Atypical mole 10/19/2020   R med bicep, mod to severe, exc 11/08/20   Basal cell carcinoma 11/05/2019   Right ear above crus. Nodular and infiltrative patterns. Excised 01/19/2020, margins free.   Coronary artery disease    Diabetes mellitus without complication (HCC)    Dysplastic nevus 11/08/2020   left med bicep at junction of L deltoid - moderate   Dysplastic nevus 11/08/2020   left med bicep at junction of L deltoid, moderate atypia   GERD (gastroesophageal reflux disease)    Hyperlipidemia    Hypertension     Past Surgical History:  Procedure Laterality Date   CORONARY ANGIOPLASTY     No stents   NOSE SURGERY     SKIN CANCER EXCISION  01/2020   right ear    TONSILLECTOMY      Current Medications: Current Meds  Medication Sig   aspirin EC 81 MG tablet Take 81 mg by mouth daily.   atorvastatin  (LIPITOR) 40 MG tablet TAKE 1 TABLET BY MOUTH EVERY DAY   CINNAMON PO Take 200 mg by mouth daily as needed.   enalapril  (VASOTEC ) 20 MG tablet TAKE 1 TABLET BY MOUTH EVERY DAY   fluticasone-salmeterol (ADVAIR) 250-50 MCG/ACT AEPB Inhale into the lungs.   isosorbide  mononitrate (IMDUR ) 30 MG 24 hr tablet TAKE 1 TABLET BY MOUTH EVERY DAY  metFORMIN  (GLUCOPHAGE ) 500 MG tablet TAKE 1 TABLET BY MOUTH TWICE A DAY WITH FOOD   Multiple Vitamin (MULTIVITAMIN) tablet Take 1 tablet by mouth daily as needed.   nitroGLYCERIN  (NITROSTAT ) 0.4 MG SL tablet Place 1 tablet (0.4 mg total) under the tongue every 5 (five) minutes as needed for chest pain.   Omega-3 Fatty Acids (FISH OIL PO) Take 1,200 mg by mouth daily as needed. Takes 1 tablet   pantoprazole (PROTONIX) 40 MG tablet Take 40 mg by mouth daily as needed.   Potassium Gluconate 550 MG TABS Take by mouth daily as needed.   triamterene -hydrochlorothiazide (MAXZIDE-25) 37.5-25 MG tablet TAKE 1 TABLET BY MOUTH  EVERY DAY IN THE MORNING   Turmeric (QC TUMERIC COMPLEX PO) Take by mouth daily.    Allergies:   Patient has no known allergies.   Social History   Socioeconomic History   Marital status: Married    Spouse name: [name]   Number of children: 0   Years of education: [employer] school   Highest education level: Associate degree: occupational, Scientist, product/process development, or vocational program  Occupational History   Not on file  Tobacco Use   Smoking status: Former    Current packs/day: 0.00    Average packs/day: 3.5 packs/day for 30.0 years (105.0 ttl pk-yrs)    Types: Cigarettes    Start date: 1963    Quit date: 1993    Years since quitting: 32.4   Smokeless tobacco: Never  Vaping Use   Vaping status: Never Used  Substance and Sexual Activity   Alcohol use: Not Currently    Comment: 15 beers on the weekends   Drug use: Never   Sexual activity: Not Currently  Other Topics Concern   Not on file  Social History Narrative   07/30/19   From: the area   Living: with wife, Stephenie Einstein (1986, together since 1981)   Work: Retired, Curator work in 2013      Family: sisters live nearby, and in-laws are in [place]      Enjoys: hunting, fishing, golf (occasionally)      Exercise: daily activities, yard work   Diet: meat - primarily venison, salads, fish, chicken - no red meat      Safety   Seat belts: Yes    Guns: Yes  and not currently secure   Safe in relationships: Yes    Social Drivers of Corporate investment banker Strain: Patient Declined (05/02/2023)   Overall Financial Resource Strain (CARDIA)    Difficulty of Paying Living Expenses: Patient declined  Food Insecurity: Patient Declined (05/02/2023)   Hunger Vital Sign    Worried About Running Out of Food in the Last Year: Patient declined    Ran Out of Food in the Last Year: Patient declined  Transportation Needs: No Transportation Needs (05/02/2023)   PRAPARE - Administrator, Civil Service (Medical): No    Lack of  Transportation (Non-Medical): No  Physical Activity: Unknown (05/02/2023)   Exercise Vital Sign    Days of Exercise per Week: Patient declined    Minutes of Exercise per Session: Not on file  Stress: No Stress Concern Present (05/02/2023)   [employer] of Occupational Health - Occupational Stress Questionnaire    Feeling of Stress : Not at all  Social Connections: Unknown (05/02/2023)   Social Connection and Isolation Panel    Frequency of Communication with Friends and Family: Once a week    Frequency of Social Gatherings with Friends and Family: Once  a week    Attends Religious Services: Patient declined    Active Member of Clubs or Organizations: No    Attends Engineer, structural: Not on file    Marital Status: Married     Family History:  The patient's family history includes Breast cancer in his sister and sister; Cancer in his maternal grandmother; Coronary artery disease in his father; Dementia in his mother; Drug abuse in his sister; Heart attack in his paternal grandfather and paternal grandmother; Melanoma in his father; Pancreatic cancer in his father.  ROS:   12-point review of systems is negative unless otherwise noted in the HPI.   EKGs/Labs/Other Studies Reviewed:    Studies reviewed were summarized above. The additional studies were reviewed today:  Myocardial PET/CT 08/15/2023:   LV perfusion is abnormal. There is evidence of ischemia. Defect 1: There is a medium defect with moderate reduction in uptake present in the apical to basal anterior and anterolateral location(s) that is partially reversible. There is normal wall motion in the defect area. Consistent with ischemia. Defect 2: There is a medium defect with absent uptake present in the apical to basal inferior location(s) that is reversible. There is abnormal wall motion in the defect area. Consistent with ischemia.   Rest left ventricular function is abnormal. Rest global function is mildly reduced.  There was a single regional abnormality. Rest EF: 45%. Stress left ventricular function is normal. Stress EF: 58%. End diastolic cavity size is normal.   Myocardial blood flow was computed to be 0.56ml/g/min at rest and 1.21ml/g/min at stress. Global myocardial blood flow reserve was 1.92 and was mildly abnormal.   Coronary calcium  was present on the attenuation correction CT images. Severe coronary calcifications were present. Coronary calcifications were present in the left anterior descending artery, left circumflex artery and right coronary artery distribution(s).   Findings are consistent with ischemia. The study is high risk.   EKG:  EKG is ordered today.  The EKG ordered today demonstrates NSR, 73 bpm, inferior T wave inversion consistent with prior tracing  Recent Labs: 05/30/2023: ALT 16; Hemoglobin 14.0; Magnesium 1.7; Platelets 299.0; TSH 1.18 06/27/2023: BUN 16; Creatinine, Ser 1.10; Potassium 4.8 Slight hemolysis seen; Sodium 139  Recent Lipid Panel    Component Value Date/Time   CHOL 125 05/30/2023 0923   TRIG 115.0 05/30/2023 0923   HDL 38.40 (L) 05/30/2023 0923   CHOLHDL 3 05/30/2023 0923   VLDL 23.0 05/30/2023 0923   LDLCALC 64 05/30/2023 0923   LDLDIRECT 90.0 05/21/2022 1513    PHYSICAL EXAM:    VS:  BP 120/60 (BP Location: Left Arm, Patient Position: Sitting)   Pulse 73   Ht 5' 8 (1.727 m)   Wt 204 lb 9.6 oz (92.8 kg)   SpO2 97%   BMI 31.11 kg/m   BMI: Body mass index is 31.11 kg/m.  Physical Exam Vitals reviewed.  Constitutional:      Appearance: He is well-developed.  HENT:     Head: Normocephalic and atraumatic.   Eyes:     General:        Right eye: No discharge.        Left eye: No discharge.    Cardiovascular:     Rate and Rhythm: Normal rate and regular rhythm.     Heart sounds: Normal heart sounds, S1 normal and S2 normal. Heart sounds not distant. No midsystolic click and no opening snap. No murmur heard.    No friction rub.  Pulmonary:  Effort: Pulmonary effort is normal. No respiratory distress.     Breath sounds: Normal breath sounds. No decreased breath sounds, wheezing or rales.     Comments: Very faint coarse breath sounds along the bilateral bases.  Chest:     Chest wall: No tenderness.   Musculoskeletal:     Cervical back: Normal range of motion.     Right lower leg: No edema.     Left lower leg: No edema.   Skin:    General: Skin is warm and dry.     Nails: There is no clubbing.   Neurological:     Mental Status: He is alert and oriented to person, place, and time.   Psychiatric:        Speech: Speech normal.        Behavior: Behavior normal.        Thought Content: Thought content normal.        Judgment: Judgment normal.     Wt Readings from Last 3 Encounters:  08/22/23 204 lb 9.6 oz (92.8 kg)  07/05/23 199 lb 3.2 oz (90.4 kg)  06/27/23 199 lb 6.4 oz (90.4 kg)     ASSESSMENT & PLAN:   CAD involving the native coronary arteries with exertional angina: Currently without symptoms of angina or cardiac decompensation.  Recent myocardial PET/CT high risk as outlined above.  Schedule LHC with primary cardiologist.  Patient does agree to rescind DNR for cardiac catheterization.  Continue aggressive risk factor modification and secondary prevention including aspirin 81 mg, Imdur  30 mg, and atorvastatin  40 mg.  Obtain echo to confirm EF noted on myocardial PET/CT.  HTN: Blood pressure is well-controlled in the office today.  He remains on enalapril  20 mg, Imdur  30 mg, and Maxide 25.  Followed by PCP.  HLD: LDL 64 in 05/2023 with normal AST/ALT at that time.  He remains on atorvastatin  40 mg.  ILD: Per pulmonology.   Informed Consent   Shared Decision Making/Informed Consent{  The risks [stroke (1 in 1000), death (1 in 1000), kidney failure [usually temporary] (1 in 500), bleeding (1 in 200), allergic reaction [possibly serious] (1 in 200)], benefits (diagnostic support and management of coronary artery  disease) and alternatives of a cardiac catheterization were discussed in detail with Mr. Oyama and he is willing to proceed.        Disposition: F/u with Dr. Nolan Battle or an APP in 2-3 weeks.   Medication Adjustments/Labs and Tests Ordered: Current medicines are reviewed at length with the patient today.  Concerns regarding medicines are outlined above. Medication changes, Labs and Tests ordered today are summarized above and listed in the Patient Instructions accessible in Encounters.   Signed, Varney Gentleman, PA-C 08/22/2023 1:22 PM     [facility] [place] [address] [place], [place] [address] [phone]

## 2023-08-22 ENCOUNTER — Ambulatory Visit: Attending: Physician Assistant | Admitting: Physician Assistant

## 2023-08-22 ENCOUNTER — Encounter: Payer: Self-pay | Admitting: Physician Assistant

## 2023-08-22 ENCOUNTER — Telehealth: Payer: Self-pay | Admitting: Physician Assistant

## 2023-08-22 VITALS — BP 120/60 | HR 73 | Ht 68.0 in | Wt 204.6 lb

## 2023-08-22 DIAGNOSIS — R9439 Abnormal result of other cardiovascular function study: Secondary | ICD-10-CM | POA: Diagnosis not present

## 2023-08-22 DIAGNOSIS — J849 Interstitial pulmonary disease, unspecified: Secondary | ICD-10-CM | POA: Diagnosis not present

## 2023-08-22 DIAGNOSIS — R0602 Shortness of breath: Secondary | ICD-10-CM | POA: Diagnosis not present

## 2023-08-22 DIAGNOSIS — E785 Hyperlipidemia, unspecified: Secondary | ICD-10-CM | POA: Diagnosis not present

## 2023-08-22 DIAGNOSIS — I25118 Atherosclerotic heart disease of native coronary artery with other forms of angina pectoris: Secondary | ICD-10-CM

## 2023-08-22 DIAGNOSIS — I2089 Other forms of angina pectoris: Secondary | ICD-10-CM

## 2023-08-22 DIAGNOSIS — I10 Essential (primary) hypertension: Secondary | ICD-10-CM | POA: Diagnosis not present

## 2023-08-22 NOTE — Telephone Encounter (Signed)
 Christopher Oconnell is requesting a callback regarding her wanting clarity on if pt needs a clearance or not before having cataracts surgery. Please advise

## 2023-08-22 NOTE — Telephone Encounter (Signed)
 Spoke with Christopher Oconnell at [facility]. She inquired about clearance and advised to just send that over and we will take care of it. She verbalized understanding with no further questions at this time.

## 2023-08-22 NOTE — Patient Instructions (Addendum)
 Medication Instructions:   Your physician recommends that you continue on your current medications as directed. Please refer to the Current Medication list given to you today.   *If you need a refill on your cardiac medications before your next appointment, please call your pharmacy*  Lab Work:  Your provider would like for you to have following labs drawn today CBC, BMET.   If you have labs (blood work) drawn today and your tests are completely normal, you will receive your results only by: MyChart Message (if you have MyChart) OR A paper copy in the mail If you have any lab test that is abnormal or we need to change your treatment, we will call you to review the results.  Testing/Procedures: Your physician has requested that you have an echocardiogram. Echocardiography is a painless test that uses sound waves to create images of your heart. It provides your doctor with information about the size and shape of your heart and how well your heart's chambers and valves are working.   You may receive an ultrasound enhancing agent through an IV if needed to better visualize your heart during the echo. This procedure takes approximately one hour.  There are no restrictions for this procedure.  This will take place at [address] [facility]) [address], [place] [address]  Please note: We ask at that you not bring children with you during ultrasound (echo/ vascular) testing. Due to room size and safety concerns, children are not allowed in the ultrasound rooms during exams. Our front office staff cannot provide observation of children in our lobby area while testing is being conducted. An adult accompanying a patient to their appointment will only be allowed in the ultrasound room at the discretion of the ultrasound technician under special circumstances. We apologize for any inconvenience.   Follow-Up: At [facility], you and your health needs are our priority.  As part of  our continuing mission to provide you with exceptional heart care, our providers are all part of one team.  This team includes your primary Cardiologist (physician) and Advanced Practice Providers or APPs (Physician Assistants and Nurse Practitioners) who all work together to provide you with the care you need, when you need it.  Your next appointment:   2-3 week(s)  Provider:   Sammy Crisp, MD or Varney Gentleman, PA-C       [facility] A DEPT OF [facility] [address] [place], [address] [place] [address] Dept: [phone] Loc: [phone]  Christopher Oconnell  08/22/2023  You are scheduled for a Cardiac Catheterization on Monday, June 23 with Dr. Veryl Gottron End.  1. Please arrive at the [facility] (Main Entrance A) at [facility]: [address] [place], [place] [address] at 5:30 AM (This time is 2 hour(s) before your procedure to ensure your preparation).   Free valet parking service is available. You will check in at ADMITTING. The support person will be asked to wait in the waiting room.  It is OK to have someone drop you off and come back when you are ready to be discharged.    Special note: Every effort is made to have your procedure done on time. Please understand that emergencies sometimes delay scheduled procedures.  2. Diet: Do not eat solid foods after midnight.  The patient may have clear liquids until 5am upon the day of the procedure.  3. Labs: You will need to have blood  drawn on today.  4. Medication instructions in preparation for your procedure:   Contrast Allergy: No  On the morning of your procedure, take your Aspirin 81 mg.  Hold all other medications until after procedure.  HOLD METFORMIN  2 Days after procedure.  5. Plan to go home the same day, you will only stay overnight if medically necessary. 6. Bring a current list of your medications and current insurance cards. 7.  You MUST have a responsible person to drive you home. 8. Someone MUST be with you the first 24 hours after you arrive home or your discharge will be delayed. 9. Please wear clothes that are easy to get on and off and wear slip-on shoes.  Thank you for allowing us  to care for you!   -- [facility]

## 2023-08-23 ENCOUNTER — Ambulatory Visit: Payer: Self-pay | Admitting: *Deleted

## 2023-08-23 LAB — CBC

## 2023-08-24 ENCOUNTER — Other Ambulatory Visit: Payer: Self-pay | Admitting: Nurse Practitioner

## 2023-08-24 DIAGNOSIS — I10 Essential (primary) hypertension: Secondary | ICD-10-CM

## 2023-08-24 LAB — CBC
Hematocrit: 46.6 % (ref 37.5–51.0)
Hemoglobin: 15.2 g/dL (ref 13.0–17.7)
MCH: 32.5 pg (ref 26.6–33.0)
MCHC: 32.6 g/dL (ref 31.5–35.7)
MCV: 100 fL — ABNORMAL HIGH (ref 79–97)
Platelets: 302 10*3/uL (ref 150–450)
RBC: 4.67 x10E6/uL (ref 4.14–5.80)
RDW: 11.9 % (ref 11.6–15.4)
WBC: 10.4 10*3/uL (ref 3.4–10.8)

## 2023-08-24 LAB — BASIC METABOLIC PANEL WITH GFR
BUN/Creatinine Ratio: 11 (ref 10–24)
BUN: 12 mg/dL (ref 8–27)
CO2: 20 mmol/L (ref 20–29)
Calcium: 10.2 mg/dL (ref 8.6–10.2)
Chloride: 101 mmol/L (ref 96–106)
Creatinine, Ser: 1.05 mg/dL (ref 0.76–1.27)
Glucose: 204 mg/dL — ABNORMAL HIGH (ref 70–99)
Potassium: 5.3 mmol/L — ABNORMAL HIGH (ref 3.5–5.2)
Sodium: 139 mmol/L (ref 134–144)
eGFR: 74 mL/min/{1.73_m2} (ref >59)

## 2023-08-26 ENCOUNTER — Ambulatory Visit: Attending: Physician Assistant

## 2023-08-26 ENCOUNTER — Ambulatory Visit: Payer: Self-pay | Admitting: Physician Assistant

## 2023-08-26 ENCOUNTER — Other Ambulatory Visit
Admission: RE | Admit: 2023-08-26 | Discharge: 2023-08-26 | Disposition: A | Discharge status: Home / Self Care | Source: Ambulatory Visit | Attending: Physician Assistant | Admitting: Physician Assistant

## 2023-08-26 ENCOUNTER — Other Ambulatory Visit: Payer: Self-pay | Admitting: Emergency Medicine

## 2023-08-26 DIAGNOSIS — R0602 Shortness of breath: Secondary | ICD-10-CM

## 2023-08-26 DIAGNOSIS — Z79899 Other long term (current) drug therapy: Secondary | ICD-10-CM | POA: Insufficient documentation

## 2023-08-26 DIAGNOSIS — I340 Nonrheumatic mitral (valve) insufficiency: Secondary | ICD-10-CM

## 2023-08-26 DIAGNOSIS — I5189 Other ill-defined heart diseases: Secondary | ICD-10-CM

## 2023-08-26 DIAGNOSIS — I2089 Other forms of angina pectoris: Secondary | ICD-10-CM | POA: Diagnosis not present

## 2023-08-26 HISTORY — DX: Nonrheumatic mitral (valve) insufficiency: I34.0

## 2023-08-26 HISTORY — DX: Other ill-defined heart diseases: I51.89

## 2023-08-26 LAB — ECHOCARDIOGRAM COMPLETE
AR max vel: 2.32 cm2
AV Area VTI: 2.27 cm2
AV Area mean vel: 2.24 cm2
AV Mean grad: 6 mmHg
AV Peak grad: 12.3 mmHg
Ao pk vel: 1.75 m/s
Area-P 1/2: 2.5 cm2
Calc EF: 60.5 %
S' Lateral: 3.5 cm
Single Plane A2C EF: 59 %
Single Plane A4C EF: 63.4 %

## 2023-08-26 LAB — POTASSIUM: Potassium: 4.1 mmol/L (ref 3.5–5.1)

## 2023-08-27 ENCOUNTER — Ambulatory Visit (INDEPENDENT_AMBULATORY_CARE_PROVIDER_SITE_OTHER): Admitting: Nurse Practitioner

## 2023-08-27 VITALS — BP 120/52 | HR 87 | Temp 97.8°F | Ht 68.0 in | Wt 197.2 lb

## 2023-08-27 DIAGNOSIS — I10 Essential (primary) hypertension: Secondary | ICD-10-CM | POA: Diagnosis not present

## 2023-08-27 DIAGNOSIS — R42 Dizziness and giddiness: Secondary | ICD-10-CM

## 2023-08-27 NOTE — Progress Notes (Signed)
 New Patient Office Visit  Subjective    Patient ID: Christopher Oconnell, male    DOB: 10/03/1950  Age: 73 y.o. MRN: [id number]  CC:  Chief Complaint  Patient presents with   Blood Pressure Check    HPI [name] presents to establish care   HTN: Patient was seen by me on 05/30/2023 at that juncture he was having lightheadedness.  We decreased amlodipine  from 10 mg to 5 mg.  Patient followed up on 06/27/2023 still experiencing some lightheadedness and blood pressure within normal limits.  We discontinued amlodipine  altogether.  He is currently maintained on enalapril  20 mg, Imdur  30 mg, and Maxide.  He is here for follow-up.  States that he did stop taking the amlodipine . He has been feeling good. He has had an improvement with the light headedness/dizziness    Outpatient Encounter Medications as of 08/27/2023  Medication Sig   aspirin EC 81 MG tablet Take 81 mg by mouth daily.   atorvastatin  (LIPITOR) 40 MG tablet TAKE 1 TABLET BY MOUTH EVERY DAY   CINNAMON PO Take 200 mg by mouth daily as needed.   enalapril  (VASOTEC ) 20 MG tablet TAKE 1 TABLET BY MOUTH EVERY DAY   fluticasone-salmeterol (ADVAIR) 250-50 MCG/ACT AEPB Inhale into the lungs.   isosorbide  mononitrate (IMDUR ) 30 MG 24 hr tablet TAKE 1 TABLET BY MOUTH EVERY DAY   Lancets (FREESTYLE) lancets Use 1 lancet as directed fingerstick Once a day to test blood sugar for Dx: E11.9   metFORMIN  (GLUCOPHAGE ) 500 MG tablet TAKE 1 TABLET BY MOUTH TWICE A DAY WITH FOOD   Multiple Vitamin (MULTIVITAMIN) tablet Take 1 tablet by mouth daily as needed.   nitroGLYCERIN  (NITROSTAT ) 0.4 MG SL tablet Place 1 tablet (0.4 mg total) under the tongue every 5 (five) minutes as needed for chest pain.   Omega-3 Fatty Acids (FISH OIL PO) Take 1,200 mg by mouth daily as needed. Takes 1 tablet   pantoprazole (PROTONIX) 40 MG tablet Take 40 mg by mouth daily as needed.   Potassium Gluconate 550 MG TABS Take by mouth daily as needed.    triamterene -hydrochlorothiazide (MAXZIDE-25) 37.5-25 MG tablet TAKE 1 TABLET BY MOUTH EVERY DAY IN THE MORNING   Turmeric (QC TUMERIC COMPLEX PO) Take by mouth daily.   No facility-administered encounter medications on file as of 08/27/2023.    Past Medical History:  Diagnosis Date   Arthritis    Asthma    Atypical mole 10/19/2020   R med bicep, mod to severe, exc 11/08/20   Basal cell carcinoma 11/05/2019   Right ear above crus. Nodular and infiltrative patterns. Excised 01/19/2020, margins free.   Coronary artery disease    Diabetes mellitus without complication (HCC)    Dysplastic nevus 11/08/2020   left med bicep at junction of L deltoid - moderate   Dysplastic nevus 11/08/2020   left med bicep at junction of L deltoid, moderate atypia   GERD (gastroesophageal reflux disease)    Hyperlipidemia    Hypertension     Past Surgical History:  Procedure Laterality Date   CORONARY ANGIOPLASTY     No stents   NOSE SURGERY     SKIN CANCER EXCISION  01/2020   right ear    TONSILLECTOMY      Family History  Problem Relation Age of Onset   Dementia Mother    Coronary artery disease Father    Melanoma Father    Pancreatic cancer Father    Breast cancer Sister  Breast cancer Sister    Drug abuse Sister    Cancer Maternal Grandmother        unsure of the type   Heart attack Paternal Grandmother    Heart attack Paternal Grandfather     Social History   Socioeconomic History   Marital status: Married    Spouse name: Christopher Oconnell   Number of children: 0   Years of education: [employer] school   Highest education level: Associate degree: occupational, Scientist, product/process development, or vocational program  Occupational History   Not on file  Tobacco Use   Smoking status: Former    Current packs/day: 0.00    Average packs/day: 3.5 packs/day for 30.0 years (105.0 ttl pk-yrs)    Types: Cigarettes    Start date: 1963    Quit date: 1993    Years since quitting: 32.4   Smokeless tobacco: Never   Vaping Use   Vaping status: Never Used  Substance and Sexual Activity   Alcohol use: Not Currently    Comment: 15 beers on the weekends   Drug use: Never   Sexual activity: Not Currently  Other Topics Concern   Not on file  Social History Narrative   07/30/19   From: the area   Living: with wife, Christopher Oconnell (1986, together since 1981)   Work: Retired, Curator work in 2013      Family: sisters live nearby, and in-laws are in [place]      Enjoys: hunting, fishing, golf (occasionally)      Exercise: daily activities, yard work   Diet: meat - primarily venison, salads, fish, chicken - no red meat      Safety   Seat belts: Yes    Guns: Yes  and not currently secure   Safe in relationships: Yes    Social Drivers of Corporate investment banker Strain: Patient Declined (08/26/2023)   Overall Financial Resource Strain (CARDIA)    Difficulty of Paying Living Expenses: Patient declined  Food Insecurity: Patient Declined (08/26/2023)   Hunger Vital Sign    Worried About Running Out of Food in the Last Year: Patient declined    Ran Out of Food in the Last Year: Patient declined  Transportation Needs: No Transportation Needs (08/26/2023)   PRAPARE - Administrator, Civil Service (Medical): No    Lack of Transportation (Non-Medical): No  Physical Activity: Inactive (08/26/2023)   Exercise Vital Sign    Days of Exercise per Week: 0 days    Minutes of Exercise per Session: Not on file  Stress: No Stress Concern Present (08/26/2023)   [employer] of Occupational Health - Occupational Stress Questionnaire    Feeling of Stress: Not at all  Social Connections: Unknown (08/26/2023)   Social Connection and Isolation Panel    Frequency of Communication with Friends and Family: Patient declined    Frequency of Social Gatherings with Friends and Family: Patient declined    Attends Religious Services: Patient declined    Database administrator or Organizations: Patient declined     Attends Banker Meetings: Not on file    Marital Status: Married  Intimate Partner Violence: Not At Risk (05/03/2023)   Humiliation, Afraid, Rape, and Kick questionnaire    Fear of Current or Ex-Partner: No    Emotionally Abused: No    Physically Abused: No    Sexually Abused: No    Review of Systems  Constitutional:  Negative for chills and fever.  HENT:         +  runny nose   Respiratory:  Negative for shortness of breath.   Cardiovascular:  Negative for chest pain.  Neurological:  Negative for dizziness and headaches.        Objective    BP (!) 120/52   Pulse 87   Temp 97.8 F (36.6 C) (Oral)   Ht 5' 8 (1.727 m)   Wt 197 lb 3.2 oz (89.4 kg)   SpO2 95%   BMI 29.98 kg/m   Physical Exam Vitals and nursing note reviewed.  Constitutional:      Appearance: Normal appearance.   Cardiovascular:     Rate and Rhythm: Normal rate and regular rhythm.     Heart sounds: Normal heart sounds.  Pulmonary:     Effort: Pulmonary effort is normal.     Breath sounds: Normal breath sounds.   Neurological:     Mental Status: He is alert.         Assessment & Plan:   Problem List Items Addressed This Visit       Cardiovascular and Mediastinum   Essential hypertension - Primary   Patient currently maintained on enalapril  20 mg daily, isosorbide  mononitrate 30 mg daily, and triamterene -hydrochlorothiazide 37.5-25 mg daily.  Patient's lightheaded has resolved.  Patient's blood pressure was on the lower end of normal.  States he has not had any to eat today and it traditionally is that way patient asymptomatic we will continue medications as is        Other   Lightheadedness   Resolved with reduction and removal of amlodipine        Return in about 4 months (around 12/27/2023) for DM recheck.   Margarie Shay, NP

## 2023-08-27 NOTE — Patient Instructions (Signed)
 Nice to see you today I want to see you in 4 months to recheck your A1C, sooner if you need me  You can use plain mucinex to help thin the mucous in your chest

## 2023-08-27 NOTE — Assessment & Plan Note (Signed)
 Patient currently maintained on enalapril  20 mg daily, isosorbide  mononitrate 30 mg daily, and triamterene -hydrochlorothiazide 37.5-25 mg daily.  Patient's lightheaded has resolved.  Patient's blood pressure was on the lower end of normal.  States he has not had any to eat today and it traditionally is that way patient asymptomatic we will continue medications as is

## 2023-08-27 NOTE — Assessment & Plan Note (Signed)
 Resolved with reduction and removal of amlodipine 

## 2023-08-28 ENCOUNTER — Telehealth: Payer: Self-pay | Admitting: *Deleted

## 2023-08-28 NOTE — Telephone Encounter (Signed)
 Cardiac Catheterization scheduled at [facility] for: Monday September 02, 2023 7:30 AM Arrival time [facility] Main Entrance A at: 5:30 AM  Nothing to eat after midnight prior to procedure, clear liquids until 5 AM day of procedure.  Medication instructions: -Hold:  Metformin -day of procedure and 48 hours post procedure  Triamterene /hydrochlorothiazide/KCl -AM of procedure -Other usual morning medications can be taken with sips of water including aspirin 81 mg.  Plan to go home the same day, you will only stay overnight if medically necessary.  You must have responsible adult to drive you home.  Someone must be with you the first 24 hours after you arrive home.  Reviewed procedure instructions with patient.

## 2023-08-29 ENCOUNTER — Encounter: Payer: Self-pay | Admitting: Anesthesiology

## 2023-08-31 ENCOUNTER — Other Ambulatory Visit: Payer: Self-pay | Admitting: Nurse Practitioner

## 2023-09-02 ENCOUNTER — Encounter (HOSPITAL_COMMUNITY)
Admission: RE | Disposition: A | Discharge status: Home / Self Care | Payer: Self-pay | Source: Home / Self Care | Attending: Internal Medicine

## 2023-09-02 ENCOUNTER — Ambulatory Visit (HOSPITAL_COMMUNITY)
Admission: RE | Admit: 2023-09-02 | Discharge: 2023-09-02 | Disposition: A | Discharge status: Home / Self Care | Attending: Internal Medicine | Admitting: Internal Medicine

## 2023-09-02 ENCOUNTER — Other Ambulatory Visit: Payer: Self-pay

## 2023-09-02 DIAGNOSIS — I2510 Atherosclerotic heart disease of native coronary artery without angina pectoris: Secondary | ICD-10-CM

## 2023-09-02 DIAGNOSIS — E119 Type 2 diabetes mellitus without complications: Secondary | ICD-10-CM | POA: Insufficient documentation

## 2023-09-02 DIAGNOSIS — Z87891 Personal history of nicotine dependence: Secondary | ICD-10-CM | POA: Diagnosis not present

## 2023-09-02 DIAGNOSIS — I10 Essential (primary) hypertension: Secondary | ICD-10-CM | POA: Diagnosis not present

## 2023-09-02 DIAGNOSIS — E785 Hyperlipidemia, unspecified: Secondary | ICD-10-CM | POA: Diagnosis not present

## 2023-09-02 DIAGNOSIS — I2584 Coronary atherosclerosis due to calcified coronary lesion: Secondary | ICD-10-CM | POA: Insufficient documentation

## 2023-09-02 DIAGNOSIS — Z79899 Other long term (current) drug therapy: Secondary | ICD-10-CM | POA: Diagnosis not present

## 2023-09-02 DIAGNOSIS — Z7984 Long term (current) use of oral hypoglycemic drugs: Secondary | ICD-10-CM | POA: Diagnosis not present

## 2023-09-02 DIAGNOSIS — R9439 Abnormal result of other cardiovascular function study: Secondary | ICD-10-CM | POA: Diagnosis present

## 2023-09-02 DIAGNOSIS — J849 Interstitial pulmonary disease, unspecified: Secondary | ICD-10-CM | POA: Diagnosis not present

## 2023-09-02 HISTORY — PX: LEFT HEART CATH AND CORONARY ANGIOGRAPHY: CATH118249

## 2023-09-02 LAB — GLUCOSE, CAPILLARY
Glucose-Capillary: 130 mg/dL — ABNORMAL HIGH (ref 70–99)
Glucose-Capillary: 116 mg/dL — ABNORMAL HIGH (ref 70–99)

## 2023-09-02 SURGERY — LEFT HEART CATH AND CORONARY ANGIOGRAPHY
Anesthesia: LOCAL

## 2023-09-02 MED ORDER — MIDAZOLAM HCL 2 MG/2ML IJ SOLN
INTRAMUSCULAR | Status: DC | PRN
Start: 2023-09-02 — End: 2023-09-02
  Administered 2023-09-02: 1 mg via INTRAVENOUS

## 2023-09-02 MED ORDER — SODIUM CHLORIDE 0.9 % WEIGHT BASED INFUSION: 1.0000 mL/kg/h | INTRAVENOUS | Status: DC

## 2023-09-02 MED ORDER — HEPARIN SODIUM (PORCINE) 1000 UNIT/ML IJ SOLN
INTRAMUSCULAR | Status: AC
  Filled 2023-09-02: qty 10

## 2023-09-02 MED ORDER — IOHEXOL 350 MG/ML SOLN
INTRAVENOUS | Status: DC | PRN
  Administered 2023-09-02: 50 mL

## 2023-09-02 MED ORDER — LIDOCAINE HCL (PF) 1 % IJ SOLN
INTRAMUSCULAR | Status: DC | PRN
  Administered 2023-09-02: 2 mL

## 2023-09-02 MED ORDER — VERAPAMIL HCL 2.5 MG/ML IV SOLN
INTRAVENOUS | Status: AC
  Filled 2023-09-02: qty 2

## 2023-09-02 MED ORDER — SODIUM CHLORIDE 0.9 % IV SOLN: INTRAVENOUS | Status: DC

## 2023-09-02 MED ORDER — FENTANYL CITRATE (PF) 100 MCG/2ML IJ SOLN
INTRAMUSCULAR | Status: AC
  Filled 2023-09-02: qty 2

## 2023-09-02 MED ORDER — FENTANYL CITRATE (PF) 100 MCG/2ML IJ SOLN
INTRAMUSCULAR | Status: DC | PRN
  Administered 2023-09-02: 25 ug via INTRAVENOUS

## 2023-09-02 MED ORDER — SODIUM CHLORIDE 0.9% FLUSH
3.0000 mL | Freq: Two times a day (BID) | INTRAVENOUS | Status: DC
Start: 2023-09-02 — End: 2023-09-02

## 2023-09-02 MED ORDER — MIDAZOLAM HCL 2 MG/2ML IJ SOLN
INTRAMUSCULAR | Status: AC
  Filled 2023-09-02: qty 2

## 2023-09-02 MED ORDER — SODIUM CHLORIDE 0.9 % WEIGHT BASED INFUSION
3.0000 mL/kg/h | INTRAVENOUS | Status: AC
Start: 2023-09-02 — End: 2023-09-02

## 2023-09-02 MED ORDER — SODIUM CHLORIDE 0.9 % IV SOLN
250.0000 mL | INTRAVENOUS | Status: DC | PRN
Start: 2023-09-02 — End: 2023-09-02

## 2023-09-02 MED ORDER — ONDANSETRON HCL 4 MG/2ML IJ SOLN: 4.0000 mg | Freq: Four times a day (QID) | INTRAMUSCULAR | Status: DC | PRN

## 2023-09-02 MED ORDER — LABETALOL HCL 5 MG/ML IV SOLN: 10.0000 mg | INTRAVENOUS | Status: DC | PRN

## 2023-09-02 MED ORDER — ASPIRIN 81 MG PO CHEW: 81.0000 mg | CHEWABLE_TABLET | ORAL | Status: AC

## 2023-09-02 MED ORDER — VERAPAMIL HCL 2.5 MG/ML IV SOLN
INTRAVENOUS | Status: DC | PRN
  Administered 2023-09-02: 10 mL via INTRA_ARTERIAL

## 2023-09-02 MED ORDER — ISOSORBIDE MONONITRATE ER 30 MG PO TB24: 60.0000 mg | ORAL_TABLET | Freq: Every day | ORAL | Status: DC

## 2023-09-02 MED ORDER — SODIUM CHLORIDE 0.9% FLUSH: 3.0000 mL | INTRAVENOUS | Status: DC | PRN

## 2023-09-02 MED ORDER — HEPARIN (PORCINE) IN NACL 1000-0.9 UT/500ML-% IV SOLN
INTRAVENOUS | Status: DC | PRN
  Administered 2023-09-02 (×2): 500 mL

## 2023-09-02 MED ORDER — HYDRALAZINE HCL 20 MG/ML IJ SOLN: 10.0000 mg | INTRAMUSCULAR | Status: DC | PRN

## 2023-09-02 MED ORDER — LIDOCAINE HCL (PF) 1 % IJ SOLN
INTRAMUSCULAR | Status: AC
  Filled 2023-09-02: qty 30

## 2023-09-02 MED ORDER — ACETAMINOPHEN 325 MG PO TABS: 650.0000 mg | ORAL_TABLET | ORAL | Status: DC | PRN

## 2023-09-02 MED ORDER — HEPARIN SODIUM (PORCINE) 1000 UNIT/ML IJ SOLN
INTRAMUSCULAR | Status: DC | PRN
  Administered 2023-09-02: 4500 [IU] via INTRAVENOUS

## 2023-09-02 SURGICAL SUPPLY — 7 items
CATH 5FR JL3.5 JR4 ANG PIG MP (CATHETERS) IMPLANT
DEVICE RAD COMP TR BAND LRG (VASCULAR PRODUCTS) IMPLANT
GLIDESHEATH SLEND SS 6F .021 (SHEATH) IMPLANT
GUIDEWIRE INQWIRE 1.5J.035X260 (WIRE) IMPLANT
KIT SYRINGE INJ CVI SPIKEX1 (MISCELLANEOUS) IMPLANT
PACK CARDIAC CATHETERIZATION (CUSTOM PROCEDURE TRAY) ×1 IMPLANT
SET ATX-X65L (MISCELLANEOUS) IMPLANT

## 2023-09-02 NOTE — Interval H&P Note (Signed)
 History and Physical Interval Note:  09/02/2023 7:14 AM  Christopher Oconnell  has presented today for surgery, with the diagnosis of chest pain, shortness of breath.  The various methods of treatment have been discussed with the patient and family. After consideration of risks, benefits and other options for treatment, the patient has consented to  Procedure(s): LEFT HEART CATH AND CORONARY ANGIOGRAPHY (N/A) as a surgical intervention.  The patient's history has been reviewed, patient examined, no change in status, stable for surgery.  I have reviewed the patient's chart and labs.  Questions were answered to the patient's satisfaction.    Cath Lab Visit (complete for each Cath Lab visit)  Clinical Evaluation Leading to the Procedure:   ACS: No.  Non-ACS:    Anginal Classification: CCS III  Anti-ischemic medical therapy: Minimal Therapy (1 class of medications)  Non-Invasive Test Results: High-risk stress test findings: cardiac mortality >3%/year  Prior CABG: No previous CABG  Christopher Oconnell

## 2023-09-02 NOTE — Discharge Instructions (Signed)
 Radial Site Care  This sheet gives you information about how to care for yourself after your procedure. Your health care provider may also give you more specific instructions. If you have problems or questions, contact your health care provider. What can I expect after the procedure? After the procedure, it is common to have: Bruising and tenderness at the catheter insertion area. Follow these instructions at home: Medicines Take over-the-counter and prescription medicines only as told by your health care provider. Insertion site care Follow instructions from your health care provider about how to take care of your insertion site. Make sure you: Wash your hands with soap and water before you remove your bandage (dressing). If soap and water are not available, use hand sanitizer. May remove dressing in 24 hours. Check your insertion site every day for signs of infection. Check for: Redness, swelling, or pain. Fluid or blood. Pus or a bad smell. Warmth. Do no take baths, swim, or use a hot tub for 5 days. You may shower 24-48 hours after the procedure. Remove the dressing and gently wash the site with plain soap and water. Pat the area dry with a clean towel. Do not rub the site. That could cause bleeding. Do not apply powder or lotion to the site. Activity  For 24 hours after the procedure, or as directed by your health care provider: Do not flex or bend the affected arm. Do not push or pull heavy objects with the affected arm. Do not drive yourself home from the hospital or clinic. You may drive 24 hours after the procedure. Do not operate machinery or power tools. KEEP ARM ELEVATED THE REMAINDER OF THE DAY. Do not push, pull or lift anything that is heavier than 10 lb for 5 days. Ask your health care provider when it is okay to: Return to work or school. Resume usual physical activities or sports. Resume sexual activity. General instructions If the catheter site starts to  bleed, raise your arm and put firm pressure on the site. If the bleeding does not stop, get help right away. This is a medical emergency. DRINK PLENTY OF FLUIDS FOR THE NEXT 2-3 DAYS. No alcohol consumption for 24 hours after receiving sedation. If you went home on the same day as your procedure, a responsible adult should be with you for the first 24 hours after you arrive home. Keep all follow-up visits as told by your health care provider. This is important. Contact a health care provider if: You have a fever. You have redness, swelling, or yellow drainage around your insertion site. Get help right away if: You have unusual pain at the radial site. The catheter insertion area swells very fast. The insertion area is bleeding, and the bleeding does not stop when you hold steady pressure on the area. Your arm or hand becomes pale, cool, tingly, or numb. These symptoms may represent a serious problem that is an emergency. Do not wait to see if the symptoms will go away. Get medical help right away. Call your local emergency services (911 in the [place] Do not drive yourself to the hospital. Summary After the procedure, it is common to have bruising and tenderness at the site. Follow instructions from your health care provider about how to take care of your radial site wound. Check the wound every day for signs of infection.  This information is not intended to replace advice given to you by your health care provider. Make sure you discuss any questions you have with  your health care provider. Document Revised: 04/03/2017 Document Reviewed: 04/03/2017 Elsevier Patient Education  2020 [employer].

## 2023-09-03 ENCOUNTER — Encounter (HOSPITAL_COMMUNITY): Payer: Self-pay | Admitting: Internal Medicine

## 2023-09-05 ENCOUNTER — Encounter: Payer: Self-pay | Admitting: Physician Assistant

## 2023-09-06 ENCOUNTER — Telehealth: Payer: Self-pay | Admitting: Internal Medicine

## 2023-09-06 NOTE — Telephone Encounter (Signed)
 I spoke with Christopher Oconnell about his catheterization earlier this week and the Heart Team discussion this morning.  Consensus from the Heart Team is that CABG would be the preferred revascularization strategy if his underlying interstitial lung disease does not preclude this.  If he is deemed a poor surgical candidate, high risk PCI with atherectomy to the LCx (this would likely require bifurcation stenting into LMCA and LAD) would be the alternative.  Christopher Oconnell would like to proceed with revascularization as soon as possible.  He denies frank chest pain but has had intermittent tingling in the chest as well as headaches since increasing Imdur  earlier this week (headaches are improving).  I have reached out to Dr. Parris, Christopher Oconnell's pulmonologist, for his thoughts.  I will place a cardiac surgery referral as soon as I have heard back from him.  Lonni Hanson, MD [facility]

## 2023-09-09 ENCOUNTER — Encounter: Admission: RE | Payer: Self-pay | Source: Home / Self Care

## 2023-09-09 ENCOUNTER — Ambulatory Visit: Admission: RE | Admit: 2023-09-09 | Source: Home / Self Care | Admitting: Ophthalmology

## 2023-09-09 SURGERY — PHACOEMULSIFICATION, CATARACT, WITH IOL INSERTION
Anesthesia: Topical | Laterality: Right

## 2023-09-10 ENCOUNTER — Other Ambulatory Visit: Payer: Self-pay

## 2023-09-10 DIAGNOSIS — I25118 Atherosclerotic heart disease of native coronary artery with other forms of angina pectoris: Secondary | ICD-10-CM

## 2023-09-10 NOTE — Telephone Encounter (Signed)
 Mr. Nevin's case was reviewed with Dr. Parris, his pulmonologist.  Per Dr. 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 Patient may safely proceed with CABG.  I will forward these recommendations to Dr. Shyrl, whom Mr. Ficek is seeing later this month in consultation for surgical revascularization of his CAD.  Lonni Hanson, MD [facility]

## 2023-09-12 ENCOUNTER — Ambulatory Visit: Admitting: Physician Assistant

## 2023-09-17 ENCOUNTER — Encounter: Payer: Self-pay | Admitting: Dermatology

## 2023-09-17 ENCOUNTER — Ambulatory Visit: Admitting: Dermatology

## 2023-09-17 DIAGNOSIS — D1801 Hemangioma of skin and subcutaneous tissue: Secondary | ICD-10-CM | POA: Diagnosis not present

## 2023-09-17 DIAGNOSIS — Z1283 Encounter for screening for malignant neoplasm of skin: Secondary | ICD-10-CM

## 2023-09-17 DIAGNOSIS — L719 Rosacea, unspecified: Secondary | ICD-10-CM | POA: Diagnosis not present

## 2023-09-17 DIAGNOSIS — L814 Other melanin hyperpigmentation: Secondary | ICD-10-CM

## 2023-09-17 DIAGNOSIS — Z86018 Personal history of other benign neoplasm: Secondary | ICD-10-CM

## 2023-09-17 DIAGNOSIS — Z7189 Other specified counseling: Secondary | ICD-10-CM | POA: Diagnosis not present

## 2023-09-17 DIAGNOSIS — L821 Other seborrheic keratosis: Secondary | ICD-10-CM | POA: Diagnosis not present

## 2023-09-17 DIAGNOSIS — D225 Melanocytic nevi of trunk: Secondary | ICD-10-CM | POA: Diagnosis not present

## 2023-09-17 DIAGNOSIS — D229 Melanocytic nevi, unspecified: Secondary | ICD-10-CM

## 2023-09-17 DIAGNOSIS — Z85828 Personal history of other malignant neoplasm of skin: Secondary | ICD-10-CM

## 2023-09-17 DIAGNOSIS — W908XXA Exposure to other nonionizing radiation, initial encounter: Secondary | ICD-10-CM

## 2023-09-17 DIAGNOSIS — D492 Neoplasm of unspecified behavior of bone, soft tissue, and skin: Secondary | ICD-10-CM

## 2023-09-17 DIAGNOSIS — L578 Other skin changes due to chronic exposure to nonionizing radiation: Secondary | ICD-10-CM

## 2023-09-17 DIAGNOSIS — L570 Actinic keratosis: Secondary | ICD-10-CM

## 2023-09-17 DIAGNOSIS — L820 Inflamed seborrheic keratosis: Secondary | ICD-10-CM | POA: Diagnosis not present

## 2023-09-17 NOTE — Patient Instructions (Addendum)

## 2023-09-17 NOTE — Progress Notes (Signed)
 Follow-Up Visit   Subjective  Christopher Oconnell is a 73 y.o. male who presents for the following: Skin Cancer Screening and Full Body Skin Exam hx of BCC, Dysplastic Nevi, AKs  The patient presents for Total-Body Skin Exam (TBSE) for skin cancer screening and mole check. The patient has spots, moles and lesions to be evaluated, some may be new or changing and the patient may have concern these could be cancer.  The following portions of the chart were reviewed this encounter and updated as appropriate: medications, allergies, medical history  Review of Systems:  No other skin or systemic complaints except as noted in HPI or Assessment and Plan.  Objective  Well appearing patient in no apparent distress; mood and affect are within normal limits.  A full examination was performed including scalp, head, eyes, ears, nose, lips, neck, chest, axillae, abdomen, back, buttocks, bilateral upper extremities, bilateral lower extremities, hands, feet, fingers, toes, fingernails, and toenails. All findings within normal limits unless otherwise noted below.   Relevant physical exam findings are noted in the Assessment and Plan.  face, ears, arms, hands x 20 (20) Pink scaly macules R sup scapula medial Irregular brown macule 0.6cm  R sup scapula lateral Irregular brown macule 0.6cm  R mid to low back Irregular brown macule 0.6cm  arms, hands x 15 (15) Stuck on waxy paps with erythema  Assessment & Plan   SKIN CANCER SCREENING PERFORMED TODAY.  ACTINIC DAMAGE - Chronic condition, secondary to cumulative UV/sun exposure - diffuse scaly erythematous macules with underlying dyspigmentation - Recommend daily broad spectrum sunscreen SPF 30+ to sun-exposed areas, reapply every 2 hours as needed.  - Staying in the shade or wearing long sleeves, sun glasses (UVA+UVB protection) and wide brim hats (4-inch brim around the entire circumference of the hat) are also recommended for sun protection.   - Call for new or changing lesions.  LENTIGINES, SEBORRHEIC KERATOSES, HEMANGIOMAS - Benign normal skin lesions - Benign-appearing - Call for any changes  MELANOCYTIC NEVI - Tan-brown and/or pink-flesh-colored symmetric macules and papules - Benign appearing on exam today - Observation - Call clinic for new or changing moles - Recommend daily use of broad spectrum spf 30+ sunscreen to sun-exposed areas.   HISTORY OF BASAL CELL CARCINOMA OF THE SKIN - No evidence of recurrence today - Recommend regular full body skin exams - Recommend daily broad spectrum sunscreen SPF 30+ to sun-exposed areas, reapply every 2 hours as needed.  - Call if any new or changing lesions are noted between office visits  - R ear above crus  HISTORY OF DYSPLASTIC NEVUS No evidence of recurrence today Recommend regular full body skin exams Recommend daily broad spectrum sunscreen SPF 30+ to sun-exposed areas, reapply every 2 hours as needed.  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 Condition is bothersome/symptomatic for patient. Currently flared. Rosacea is a chronic progressive skin condition usually affecting the face of adults, causing redness and/or acne bumps. It is treatable but not curable. It sometimes affects the eyes (ocular rosacea) as well. It may respond to topical and/or systemic medication and can flare with stress, sun exposure, alcohol, exercise, topical steroids (including hydrocortisone/cortisone 10) and some foods.  Daily application of broad spectrum spf 30+ sunscreen to  face is recommended to reduce flares. Treatment Plan Discussed SM Triple cream, pt declines Discussed Doxycycline but recommend topical first Cont otc  [facility] Counseling for BBL / IPL / Laser and Coordination of Care Discussed the treatment option of Broad Band Light (BBL) /Intense Pulsed Light (IPL)/ Laser for skin discoloration, including brown spots and redness.  Typically we recommend at least 1-3 treatment sessions about 5-8 weeks apart for best results.  Cannot have tanned skin when BBL performed, and regular use of sunscreen/photoprotection is advised after the procedure to help maintain results. The patient's condition may also require maintenance treatments in the future.  The fee for BBL / laser treatments is $350 per treatment session for the whole face.  A fee can be quoted for other parts of the body.  Insurance typically does not pay for BBL/laser treatments and therefore the fee is an out-of-pocket cost. Recommend prophylactic valtrex treatment. Once scheduled for procedure, will send Rx in prior to patient's appointment.    NEVUS ANEMICUS R upper back Exam: patch of blanched skin R upper back Treatment Plan: Benign-appearing.  Observation.  Call clinic for new or changing moles.  Recommend daily use of broad spectrum spf 30+ sunscreen to sun-exposed areas.    AK (ACTINIC KERATOSIS) (20) face, ears, arms, hands x 20 (20) Actinic keratoses are precancerous spots that appear secondary to cumulative UV radiation exposure/sun exposure over time. They are chronic with expected duration over 1 year. A portion of actinic keratoses will progress to squamous cell carcinoma of the skin. It is not possible to reliably predict which spots will progress to skin cancer and so treatment is recommended to prevent development of skin cancer.  Recommend daily broad spectrum sunscreen SPF 30+ to sun-exposed areas, reapply every 2 hours as needed.  Recommend staying in the shade or wearing long sleeves, sun glasses (UVA+UVB protection) and wide brim hats (4-inch brim around the entire circumference of the hat). Call for new or changing  lesions. Destruction of lesion - face, ears, arms, hands x 20 (20) Complexity: simple   Destruction method: cryotherapy   Informed consent: discussed and consent obtained   Timeout:  patient name, date of birth, surgical site, and procedure verified Lesion destroyed using liquid nitrogen: Yes   Region frozen until ice ball extended beyond lesion: Yes   Outcome: patient tolerated procedure well with no complications   Post-procedure details: wound care instructions given    NEOPLASM OF SKIN (3) R sup scapula medial Epidermal / dermal shaving  Lesion diameter (cm):  0.6 Informed consent: discussed and consent obtained   Timeout: patient name, date of birth, surgical site, and procedure verified   Procedure prep:  Patient was prepped and draped in usual sterile fashion Prep type:  Isopropyl alcohol Anesthesia: the lesion was anesthetized in a standard fashion   Anesthetic:  1% lidocaine  w/ epinephrine 1-100,000 buffered w/ 8.4% NaHCO3 Instrument used: flexible razor blade   Hemostasis achieved with: pressure, aluminum chloride and electrodesiccation   Outcome: patient tolerated procedure well   Post-procedure details: sterile dressing applied and wound care instructions given   Dressing type: bandage and bacitracin    Specimen 1 - Surgical pathology Differential Diagnosis: Nevus vs Dysplastic Nevus  Check Margins: yes Irregular brown macule 0.6cm R sup scapula lateral Epidermal / dermal shaving  Lesion diameter (cm):  0.6 Informed consent: discussed and consent obtained   Timeout: patient name, date of birth, surgical site, and procedure verified   Procedure prep:  Patient was prepped and  draped in usual sterile fashion Prep type:  Isopropyl alcohol Anesthesia: the lesion was anesthetized in a standard fashion   Anesthetic:  1% lidocaine  w/ epinephrine 1-100,000 buffered w/ 8.4% NaHCO3 Instrument used: flexible razor blade   Hemostasis achieved with: pressure, aluminum  chloride and electrodesiccation   Outcome: patient tolerated procedure well   Post-procedure details: sterile dressing applied and wound care instructions given   Dressing type: bandage and bacitracin    Specimen 2 - Surgical pathology Differential Diagnosis:  Nevus vs Dysplastic Nevus  Check Margins: yes Irregular brown macule 0.6cm R mid to low back Epidermal / dermal shaving  Lesion diameter (cm):  0.6 Informed consent: discussed and consent obtained   Timeout: patient name, date of birth, surgical site, and procedure verified   Procedure prep:  Patient was prepped and draped in usual sterile fashion Prep type:  Isopropyl alcohol Anesthesia: the lesion was anesthetized in a standard fashion   Anesthetic:  1% lidocaine  w/ epinephrine 1-100,000 buffered w/ 8.4% NaHCO3 Instrument used: flexible razor blade   Hemostasis achieved with: pressure, aluminum chloride and electrodesiccation   Outcome: patient tolerated procedure well   Post-procedure details: sterile dressing applied and wound care instructions given   Dressing type: bandage and bacitracin    Specimen 3 - Surgical pathology Differential Diagnosis:  Nevus vs Dysplastic Nevus  Check Margins: yes Irregular brown macule 0.6cm INFLAMED SEBORRHEIC KERATOSIS (15) arms, hands x 15 (15) Symptomatic, irritating, patient would like treated. Destruction of lesion - arms, hands x 15 (15) Complexity: simple   Destruction method: cryotherapy   Informed consent: discussed and consent obtained   Timeout:  patient name, date of birth, surgical site, and procedure verified Lesion destroyed using liquid nitrogen: Yes   Region frozen until ice ball extended beyond lesion: Yes   Outcome: patient tolerated procedure well with no complications   Post-procedure details: wound care instructions given    Return in about 1 year (around 09/16/2024) for TBSE, Hx of BCC, Hx of Dysplastic nevi, Hx of AKs.  I, Grayce Saunas, RMA, am acting as  scribe for Alm Rhyme, MD .   Documentation: I have reviewed the above documentation for accuracy and completeness, and I agree with the above.  Alm Rhyme, MD

## 2023-09-18 ENCOUNTER — Ambulatory Visit: Payer: Medicare HMO | Admitting: Dermatology

## 2023-09-19 ENCOUNTER — Ambulatory Visit: Payer: Self-pay | Admitting: Dermatology

## 2023-09-19 LAB — SURGICAL PATHOLOGY

## 2023-09-23 ENCOUNTER — Encounter: Payer: Self-pay | Admitting: Dermatology

## 2023-09-23 NOTE — Telephone Encounter (Signed)
-----   Message from Alm Rhyme sent at 09/19/2023  7:48 PM EDT ----- FINAL DIAGNOSIS        1. Skin, R sup scapula medial :       DYSPLASTIC JUNCTIONAL NEVUS WITH MODERATE ATYPIA, CLOSE TO MARGIN        2. Skin, R sup scapula lateral :       PIGMENTED SEBORRHEIC KERATOSIS, EARLY        3. Skin, R mid to low back :       DYSPLASTIC JUNCTIONAL LENTIGINOUS NEVUS WITH MODERATE ATYPIA, PERIPHERAL MARGIN       INVOLVED   1&3 - both Moderate Dysplastic Recheck next visit 2- benign keratosis No further treatment needed ----- Message ----- From: Interface, [facility] In [facility] Sent: 09/19/2023   5:36 PM EDT To: Alm JAYSON Rhyme, MD

## 2023-09-23 NOTE — Telephone Encounter (Signed)
 Patient informed of pathology results

## 2023-09-25 NOTE — Progress Notes (Unsigned)
 [address]       [place] [address]             [phone]        [name] [facility] Medical Record #[id number] Date of Birth: 1950-03-15  Referring: Mady Bruckner, MD Primary Care: Wendee Lynwood HERO, NP Primary Cardiologist:Christopher End, MD  Chief Complaint:   No chief complaint on file.   History of Present Illness:     Christopher Oconnell is a 73 y.o. male who presents for surgical evaluation of ***   Past Medical and Surgical History: Previous Chest Surgery: *** Previous Chest Radiation: *** Diabetes Mellitus: ***.  HbA1C *** Creatinine:  Lab Results  Component Value Date   CREATININE 1.05 08/22/2023   CREATININE 1.10 06/27/2023   CREATININE 1.25 05/30/2023     Past Medical History:  Diagnosis Date   Acne    Arthritis    Asthma    Atypical mole 10/19/2020   R med bicep, mod to severe, exc 11/08/20   Basal cell carcinoma 11/05/2019   Right ear above crus. Nodular and infiltrative patterns. Excised 01/19/2020, margins free.   Coronary artery disease    Diabetes mellitus without complication (HCC)    Dysplastic nevus 11/08/2020   left med bicep at junction of L deltoid, moderate atypia   Dysplastic nevus 09/17/2023   R mid to low back - moderate   Dysplastic nevus 09/17/2023   R sup scapula medial - moderate   GERD (gastroesophageal reflux disease)    Hyperlipidemia    Hypertension     Past Surgical History:  Procedure Laterality Date   CORONARY ANGIOPLASTY     No stents   LEFT HEART CATH AND CORONARY ANGIOGRAPHY N/A 09/02/2023   Procedure: LEFT HEART CATH AND CORONARY ANGIOGRAPHY;  Surgeon: Mady Bruckner, MD;  Location: [facility];  Service: Cardiovascular;  Laterality: N/A;   NOSE SURGERY     SKIN CANCER EXCISION  01/2020   right ear    TONSILLECTOMY      Social History:  Social History   Tobacco Use  Smoking Status Former   Current packs/day: 0.00   Average packs/day: 3.5 packs/day for  30.0 years (105.0 ttl pk-yrs)   Types: Cigarettes   Start date: 1963   Quit date: 1993   Years since quitting: 32.5  Smokeless Tobacco Never    Social History   Substance and Sexual Activity  Alcohol Use Not Currently   Comment: 15 beers on the weekends     No Known Allergies  Medications: Asprin: *** Statin: *** Beta Blocker: *** Ace Inhibitor: *** Anti-Coagulation: ***  Current Outpatient Medications  Medication Sig Dispense Refill   aspirin  EC 81 MG tablet Take 81 mg by mouth daily.     atorvastatin  (LIPITOR) 40 MG tablet TAKE 1 TABLET BY MOUTH EVERY DAY 90 tablet 3   CINNAMON PO Take 200 mg by mouth 2 (two) times a week.     enalapril  (VASOTEC ) 20 MG tablet TAKE 1 TABLET BY MOUTH EVERY DAY 90 tablet 3   fluticasone-salmeterol (ADVAIR) 250-50 MCG/ACT AEPB Inhale 1 puff into the lungs in the morning and at bedtime.     isosorbide  mononitrate (IMDUR ) 30 MG 24 hr tablet Take 2 tablets (60 mg total) by mouth daily.     Lancets (FREESTYLE) lancets Use 1 lancet as directed fingerstick Once a day to test blood sugar for Dx: E11.9     metFORMIN  (GLUCOPHAGE ) 500 MG  tablet TAKE 1 TABLET BY MOUTH TWICE A DAY WITH FOOD 180 tablet 1   Multiple Vitamin (MULTIVITAMIN) tablet Take 1 tablet by mouth 2 (two) times a week.     nitroGLYCERIN  (NITROSTAT ) 0.4 MG SL tablet Place 1 tablet (0.4 mg total) under the tongue every 5 (five) minutes as needed for chest pain. 25 tablet 3   Omega-3 Fatty Acids (FISH OIL PO) Take 1,200 mg by mouth 2 (two) times a week. Takes 1 tablet     pantoprazole (PROTONIX) 40 MG tablet Take 40 mg by mouth daily as needed (heart burn).     Potassium Gluconate 550 MG TABS Take 1 tablet by mouth 2 (two) times a week.     triamterene -hydrochlorothiazide (MAXZIDE-25) 37.5-25 MG tablet TAKE 1 TABLET BY MOUTH EVERY DAY IN THE MORNING 90 tablet 1   Turmeric (QC TUMERIC COMPLEX PO) Take 1 capsule by mouth 2 (two) times a week.     No current facility-administered medications  for this visit.    (Not in a hospital admission)   Family History  Problem Relation Age of Onset   Dementia Mother    Coronary artery disease Father    Melanoma Father    Pancreatic cancer Father    Breast cancer Sister    Breast cancer Sister    Drug abuse Sister    Cancer Maternal Grandmother        unsure of the type   Heart attack Paternal Grandmother    Heart attack Paternal Grandfather      Review of Systems:   ROS    Physical Exam: There were no vitals taken for this visit. Physical Exam    Diagnostic Studies & Laboratory data:   Intervention Cardiac Studies & Procedures   ______________________________________________________________________________________________ CARDIAC CATHETERIZATION  CARDIAC CATHETERIZATION 09/02/2023  Conclusion Conclusions: Severe two-vessel coronary artery disease with 99% mid RCA and 90% ostial LCx stenoses.  There is also moderate diffuse LMCA and LAD disease with heavy calcification. Mildly elevated left ventricular filling pressure (LVEDP 20 mmHg).  Recommendations: Increase isosorbide  mononitrate to 60 mg daily. Review images at Heart Team meeting. Aggressive secondary prevention of coronary artery disease.  Lonni Hanson, MD [facility]  Findings Coronary Findings Diagnostic  Dominance: Right  Left Main Vessel is large. There is moderate diffuse disease throughout the vessel.  Left Anterior Descending Vessel is moderate in size. There is moderate diffuse disease throughout the vessel.  First Diagonal Branch Vessel is moderate in size. 1st Diag lesion is 60% stenosed. The lesion was previously treated using angioplasty and atherectomy .  Left Circumflex Ost Cx to Prox Cx lesion is 90% stenosed. The lesion is severely calcified.  First Obtuse Marginal Branch Vessel is moderate in size.  Second Obtuse Marginal Branch Vessel is moderate in size. 2nd Mrg lesion is 30% stenosed.  Right Coronary  Artery Vessel is moderate in size. There is moderate diffuse disease throughout the vessel. Prox RCA lesion is 99% stenosed. The lesion is severely calcified. Mid RCA to Dist RCA lesion is 70% stenosed.  Right Posterior Descending Artery Vessel is moderate in size.  Right Posterior Atrioventricular Artery Vessel is small in size.  First Right Posterolateral Branch Vessel is small in size.  Intervention  No interventions have been documented.   STRESS TESTS  NM PET CT CARDIAC PERFUSION MULTI W/ABSOLUTE BLOODFLOW 08/15/2023  Narrative   LV perfusion is abnormal. There is evidence of ischemia. Defect 1: There is a medium defect with moderate reduction in uptake present in  the apical to basal anterior and anterolateral location(s) that is partially reversible. There is normal wall motion in the defect area. Consistent with ischemia. Defect 2: There is a medium defect with absent uptake present in the apical to basal inferior location(s) that is reversible. There is abnormal wall motion in the defect area. Consistent with ischemia.   Rest left ventricular function is abnormal. Rest global function is mildly reduced. There was a single regional abnormality. Rest EF: 45%. Stress left ventricular function is normal. Stress EF: 58%. End diastolic cavity size is normal.   Myocardial blood flow was computed to be 0.45ml/g/min at rest and 1.21ml/g/min at stress. Global myocardial blood flow reserve was 1.92 and was mildly abnormal.   Coronary calcium  was present on the attenuation correction CT images. Severe coronary calcifications were present. Coronary calcifications were present in the left anterior descending artery, left circumflex artery and right coronary artery distribution(s).   Findings are consistent with ischemia. The study is high risk.   Electronically signed by: Lonni Hanson, MD.  CLINICAL DATA:  This over-read does not include interpretation of cardiac or coronary anatomy or  pathology. The Cardiac PET CT interpretation by the cardiologist is attached.  COMPARISON:  03/18/2023 chest CT  FINDINGS: No pleural fluid. Relatively similar appearance of subpleural predominant ground-glass, architectural distortion, and traction bronchiectasis/bronchiolectasis. More apparent in the upper lobes.  Aortic atherosclerosis.  No imaged thoracic adenopathy.  Tiny hiatal hernia.  Normal imaged portions of the liver, spleen, pancreas, gallbladder, adrenal glands.  No acute osseous abnormality.  IMPRESSION: 1.  No acute findings in the imaged extracardiac chest. 2. Similar upper lobe predominant subpleural ground-glass and architectural distortion, favoring nonspecific interstitial pneumonitis. 3.  Aortic Atherosclerosis (ICD10-I70.0). 4.  Tiny hiatal hernia.   Electronically Signed By: Rockey Kilts M.D. On: 08/15/2023 15:51   ECHOCARDIOGRAM  ECHOCARDIOGRAM COMPLETE 08/26/2023  Narrative ECHOCARDIOGRAM REPORT    Patient Name:   Christopher Oconnell Date of Exam: 08/26/2023 Medical Rec #:  [id number]           Height:       68.0 in Accession #:    [id number]          Weight:       204.6 lb Date of Birth:  Jan 28, 1951           BSA:          2.064 m Patient Age:    73 years            BP:           120/60 mmHg Patient Gender: M                   HR:           71 bpm. Exam Location:    Procedure: 2D Echo, Cardiac Doppler and Color Doppler (Both Spectral and Color Flow Doppler were utilized during procedure).  Indications:    I20.89 Exertional angina; ; ; R06.02 SOB  History:        Patient has no prior history of Echocardiogram examinations. CAD, Signs/Symptoms:Dyspnea and Dizziness/Lightheadedness; Risk Factors:Hypertension, Diabetes, Dyslipidemia and Former Smoker.  Sonographer:    Alm Minerva Referring Phys: [id number] RYAN M DUNN  IMPRESSIONS   1. Left ventricular ejection fraction, by estimation, is 60 to 65%. Left ventricular ejection  fraction by 2D MOD biplane is 60.5 %. The left ventricle has normal function. The left ventricle has no regional wall motion abnormalities. Left ventricular diastolic parameters are consistent  with Grade I diastolic dysfunction (impaired relaxation). 2. Right ventricular systolic function is normal. The right ventricular size is normal. 3. The mitral valve is normal in structure. Mild mitral valve regurgitation. 4. The aortic valve is tricuspid. Aortic valve regurgitation is not visualized. Aortic valve sclerosis/calcification is present, without any evidence of aortic stenosis. Aortic valve mean gradient measures 6.0 mmHg. 5. The inferior vena cava is normal in size with greater than 50% respiratory variability, suggesting right atrial pressure of 3 mmHg.  FINDINGS Left Ventricle: Left ventricular ejection fraction, by estimation, is 60 to 65%. Left ventricular ejection fraction by 2D MOD biplane is 60.5 %. The left ventricle has normal function. The left ventricle has no regional wall motion abnormalities. The left ventricular internal cavity size was normal in size. There is no left ventricular hypertrophy. Left ventricular diastolic parameters are consistent with Grade I diastolic dysfunction (impaired relaxation).  Right Ventricle: The right ventricular size is normal. No increase in right ventricular wall thickness. Right ventricular systolic function is normal.  Left Atrium: Left atrial size was normal in size.  Right Atrium: Right atrial size was normal in size.  Pericardium: There is no evidence of pericardial effusion.  Mitral Valve: The mitral valve is normal in structure. Mild mitral valve regurgitation.  Tricuspid Valve: The tricuspid valve is normal in structure. Tricuspid valve regurgitation is mild.  Aortic Valve: The aortic valve is tricuspid. Aortic valve regurgitation is not visualized. Aortic valve sclerosis/calcification is present, without any evidence of aortic stenosis.  Aortic valve mean gradient measures 6.0 mmHg. Aortic valve peak gradient measures 12.2 mmHg. Aortic valve area, by VTI measures 2.27 cm.  Pulmonic Valve: The pulmonic valve was normal in structure. Pulmonic valve regurgitation is not visualized.  Aorta: The aortic root and ascending aorta are structurally normal, with no evidence of dilitation.  Venous: The inferior vena cava is normal in size with greater than 50% respiratory variability, suggesting right atrial pressure of 3 mmHg.  IAS/Shunts: No atrial level shunt detected by color flow Doppler.   LEFT VENTRICLE PLAX 2D                        Biplane EF (MOD) LVIDd:         5.00 cm         LV Biplane EF:   Left LVIDs:         3.50 cm                          ventricular LV PW:         0.90 cm                          ejection LV IVS:        0.90 cm                          fraction by LVOT diam:     2.20 cm                          2D MOD LV SV:         79                               biplane is LV SV Index:  38                               60.5 %. LVOT Area:     3.80 cm Diastology LV e' medial:    6.42 cm/s LV Volumes (MOD)               LV E/e' medial:  10.1 LV vol d, MOD    75.7 ml       LV e' lateral:   8.81 cm/s A2C:                           LV E/e' lateral: 7.3 LV vol d, MOD    97.3 ml A4C: LV vol s, MOD    31.0 ml A2C: LV vol s, MOD    35.6 ml A4C: LV SV MOD A2C:   44.7 ml LV SV MOD A4C:   97.3 ml LV SV MOD BP:    56.3 ml  RIGHT VENTRICLE RV Basal diam:  2.90 cm RV Mid diam:    2.90 cm RV S prime:     10.40 cm/s TAPSE (M-mode): 2.0 cm  LEFT ATRIUM             Index        RIGHT ATRIUM           Index LA Vol (A2C):   71.6 ml 34.70 ml/m  RA Area:     16.70 cm LA Vol (A4C):   63.3 ml 30.67 ml/m  RA Volume:   43.40 ml  21.03 ml/m LA Biplane Vol: 67.9 ml 32.90 ml/m AORTIC VALVE AV Area (Vmax):    2.32 cm AV Area (Vmean):   2.24 cm AV Area (VTI):     2.27 cm AV Vmax:           175.00 cm/s AV  Vmean:          117.000 cm/s AV VTI:            0.348 m AV Peak Grad:      12.2 mmHg AV Mean Grad:      6.0 mmHg LVOT Vmax:         107.00 cm/s LVOT Vmean:        68.800 cm/s LVOT VTI:          0.208 m LVOT/AV VTI ratio: 0.60  AORTA Ao Sinus diam: 3.30 cm Ao STJ diam:   2.6 cm Ao Asc diam:   3.00 cm  MITRAL VALVE               TRICUSPID VALVE MV Area (PHT): 2.50 cm    TR Peak grad:   13.1 mmHg MV Decel Time: 303 msec    TR Vmax:        181.00 cm/s MV E velocity: 64.60 cm/s MV A velocity: 84.10 cm/s  SHUNTS MV E/A ratio:  0.77        Systemic VTI:  0.21 m Systemic Diam: 2.20 cm  Redell Cave MD Electronically signed by Redell Cave MD Signature Date/Time: 08/26/2023/4:26:01 PM    Final          ______________________________________________________________________________________________    ***   EKG: *** I have independently reviewed the above radiologic studies and discussed with the patient   Recent Lab Findings: Lab Results  Component Value Date   WBC 10.4 08/22/2023   HGB 15.2 08/22/2023   HCT 46.6  08/22/2023   PLT 302 08/22/2023   GLUCOSE 204 (H) 08/22/2023   CHOL 125 05/30/2023   TRIG 115.0 05/30/2023   HDL 38.40 (L) 05/30/2023   LDLDIRECT 90.0 05/21/2022   LDLCALC 64 05/30/2023   ALT 16 05/30/2023   AST 16 05/30/2023   NA 139 08/22/2023   K 4.1 08/26/2023   CL 101 08/22/2023   CREATININE 1.05 08/22/2023   BUN 12 08/22/2023   CO2 20 08/22/2023   TSH 1.18 05/30/2023   HGBA1C 6.0 (A) 05/30/2023      Assessment / Plan:   73 y.o. male with ***     I  spent {CHL ONC TIME VISIT - DTPQU:[id number]} counseling the patient face to face.   Linnie MALVA Rayas 09/25/2023 4:14 PM

## 2023-09-25 NOTE — H&P (View-Only) (Signed)
 [address]       [place] [address]             [phone]        [name] [facility] Medical Record #[id number] Date of Birth: 1950-03-15  Referring: Mady Bruckner, MD Primary Care: Wendee Lynwood HERO, NP Primary Cardiologist:Christopher End, MD  Chief Complaint:   No chief complaint on file.   History of Present Illness:     Christopher Oconnell is a 73 y.o. male who presents for surgical evaluation of ***   Past Medical and Surgical History: Previous Chest Surgery: *** Previous Chest Radiation: *** Diabetes Mellitus: ***.  HbA1C *** Creatinine:  Lab Results  Component Value Date   CREATININE 1.05 08/22/2023   CREATININE 1.10 06/27/2023   CREATININE 1.25 05/30/2023     Past Medical History:  Diagnosis Date   Acne    Arthritis    Asthma    Atypical mole 10/19/2020   R med bicep, mod to severe, exc 11/08/20   Basal cell carcinoma 11/05/2019   Right ear above crus. Nodular and infiltrative patterns. Excised 01/19/2020, margins free.   Coronary artery disease    Diabetes mellitus without complication (HCC)    Dysplastic nevus 11/08/2020   left med bicep at junction of L deltoid, moderate atypia   Dysplastic nevus 09/17/2023   R mid to low back - moderate   Dysplastic nevus 09/17/2023   R sup scapula medial - moderate   GERD (gastroesophageal reflux disease)    Hyperlipidemia    Hypertension     Past Surgical History:  Procedure Laterality Date   CORONARY ANGIOPLASTY     No stents   LEFT HEART CATH AND CORONARY ANGIOGRAPHY N/A 09/02/2023   Procedure: LEFT HEART CATH AND CORONARY ANGIOGRAPHY;  Surgeon: Mady Bruckner, MD;  Location: [facility] INVASIVE CV LAB;  Service: Cardiovascular;  Laterality: N/A;   NOSE SURGERY     SKIN CANCER EXCISION  01/2020   right ear    TONSILLECTOMY      Social History:  Social History   Tobacco Use  Smoking Status Former   Current packs/day: 0.00   Average packs/day: 3.5 packs/day for  30.0 years (105.0 ttl pk-yrs)   Types: Cigarettes   Start date: 1963   Quit date: 1993   Years since quitting: 32.5  Smokeless Tobacco Never    Social History   Substance and Sexual Activity  Alcohol Use Not Currently   Comment: 15 beers on the weekends     No Known Allergies  Medications: Asprin: *** Statin: *** Beta Blocker: *** Ace Inhibitor: *** Anti-Coagulation: ***  Current Outpatient Medications  Medication Sig Dispense Refill   aspirin  EC 81 MG tablet Take 81 mg by mouth daily.     atorvastatin  (LIPITOR) 40 MG tablet TAKE 1 TABLET BY MOUTH EVERY DAY 90 tablet 3   CINNAMON PO Take 200 mg by mouth 2 (two) times a week.     enalapril  (VASOTEC ) 20 MG tablet TAKE 1 TABLET BY MOUTH EVERY DAY 90 tablet 3   fluticasone-salmeterol (ADVAIR) 250-50 MCG/ACT AEPB Inhale 1 puff into the lungs in the morning and at bedtime.     isosorbide  mononitrate (IMDUR ) 30 MG 24 hr tablet Take 2 tablets (60 mg total) by mouth daily.     Lancets (FREESTYLE) lancets Use 1 lancet as directed fingerstick Once a day to test blood sugar for Dx: E11.9     metFORMIN  (GLUCOPHAGE ) 500 MG  tablet TAKE 1 TABLET BY MOUTH TWICE A DAY WITH FOOD 180 tablet 1   Multiple Vitamin (MULTIVITAMIN) tablet Take 1 tablet by mouth 2 (two) times a week.     nitroGLYCERIN  (NITROSTAT ) 0.4 MG SL tablet Place 1 tablet (0.4 mg total) under the tongue every 5 (five) minutes as needed for chest pain. 25 tablet 3   Omega-3 Fatty Acids (FISH OIL PO) Take 1,200 mg by mouth 2 (two) times a week. Takes 1 tablet     pantoprazole (PROTONIX) 40 MG tablet Take 40 mg by mouth daily as needed (heart burn).     Potassium Gluconate 550 MG TABS Take 1 tablet by mouth 2 (two) times a week.     triamterene -hydrochlorothiazide (MAXZIDE-25) 37.5-25 MG tablet TAKE 1 TABLET BY MOUTH EVERY DAY IN THE MORNING 90 tablet 1   Turmeric (QC TUMERIC COMPLEX PO) Take 1 capsule by mouth 2 (two) times a week.     No current facility-administered medications  for this visit.    (Not in a hospital admission)   Family History  Problem Relation Age of Onset   Dementia Mother    Coronary artery disease Father    Melanoma Father    Pancreatic cancer Father    Breast cancer Sister    Breast cancer Sister    Drug abuse Sister    Cancer Maternal Grandmother        unsure of the type   Heart attack Paternal Grandmother    Heart attack Paternal Grandfather      Review of Systems:   ROS    Physical Exam: There were no vitals taken for this visit. Physical Exam    Diagnostic Studies & Laboratory data:   Intervention Cardiac Studies & Procedures   ______________________________________________________________________________________________ CARDIAC CATHETERIZATION  CARDIAC CATHETERIZATION 09/02/2023  Conclusion Conclusions: Severe two-vessel coronary artery disease with 99% mid RCA and 90% ostial LCx stenoses.  There is also moderate diffuse LMCA and LAD disease with heavy calcification. Mildly elevated left ventricular filling pressure (LVEDP 20 mmHg).  Recommendations: Increase isosorbide  mononitrate to 60 mg daily. Review images at Heart Team meeting. Aggressive secondary prevention of coronary artery disease.  Lonni Hanson, MD [facility]  Findings Coronary Findings Diagnostic  Dominance: Right  Left Main Vessel is large. There is moderate diffuse disease throughout the vessel.  Left Anterior Descending Vessel is moderate in size. There is moderate diffuse disease throughout the vessel.  First Diagonal Branch Vessel is moderate in size. 1st Diag lesion is 60% stenosed. The lesion was previously treated using angioplasty and atherectomy .  Left Circumflex Ost Cx to Prox Cx lesion is 90% stenosed. The lesion is severely calcified.  First Obtuse Marginal Branch Vessel is moderate in size.  Second Obtuse Marginal Branch Vessel is moderate in size. 2nd Mrg lesion is 30% stenosed.  Right Coronary  Artery Vessel is moderate in size. There is moderate diffuse disease throughout the vessel. Prox RCA lesion is 99% stenosed. The lesion is severely calcified. Mid RCA to Dist RCA lesion is 70% stenosed.  Right Posterior Descending Artery Vessel is moderate in size.  Right Posterior Atrioventricular Artery Vessel is small in size.  First Right Posterolateral Branch Vessel is small in size.  Intervention  No interventions have been documented.   STRESS TESTS  NM PET CT CARDIAC PERFUSION MULTI W/ABSOLUTE BLOODFLOW 08/15/2023  Narrative   LV perfusion is abnormal. There is evidence of ischemia. Defect 1: There is a medium defect with moderate reduction in uptake present in  the apical to basal anterior and anterolateral location(s) that is partially reversible. There is normal wall motion in the defect area. Consistent with ischemia. Defect 2: There is a medium defect with absent uptake present in the apical to basal inferior location(s) that is reversible. There is abnormal wall motion in the defect area. Consistent with ischemia.   Rest left ventricular function is abnormal. Rest global function is mildly reduced. There was a single regional abnormality. Rest EF: 45%. Stress left ventricular function is normal. Stress EF: 58%. End diastolic cavity size is normal.   Myocardial blood flow was computed to be 0.45ml/g/min at rest and 1.21ml/g/min at stress. Global myocardial blood flow reserve was 1.92 and was mildly abnormal.   Coronary calcium  was present on the attenuation correction CT images. Severe coronary calcifications were present. Coronary calcifications were present in the left anterior descending artery, left circumflex artery and right coronary artery distribution(s).   Findings are consistent with ischemia. The study is high risk.   Electronically signed by: Lonni Hanson, MD.  CLINICAL DATA:  This over-read does not include interpretation of cardiac or coronary anatomy or  pathology. The Cardiac PET CT interpretation by the cardiologist is attached.  COMPARISON:  03/18/2023 chest CT  FINDINGS: No pleural fluid. Relatively similar appearance of subpleural predominant ground-glass, architectural distortion, and traction bronchiectasis/bronchiolectasis. More apparent in the upper lobes.  Aortic atherosclerosis.  No imaged thoracic adenopathy.  Tiny hiatal hernia.  Normal imaged portions of the liver, spleen, pancreas, gallbladder, adrenal glands.  No acute osseous abnormality.  IMPRESSION: 1.  No acute findings in the imaged extracardiac chest. 2. Similar upper lobe predominant subpleural ground-glass and architectural distortion, favoring nonspecific interstitial pneumonitis. 3.  Aortic Atherosclerosis (ICD10-I70.0). 4.  Tiny hiatal hernia.   Electronically Signed By: Rockey Kilts M.D. On: 08/15/2023 15:51   ECHOCARDIOGRAM  ECHOCARDIOGRAM COMPLETE 08/26/2023  Narrative ECHOCARDIOGRAM REPORT    Patient Name:   Christopher Oconnell Date of Exam: 08/26/2023 Medical Rec #:  [id number]           Height:       68.0 in Accession #:    [id number]          Weight:       204.6 lb Date of Birth:  Jan 28, 1951           BSA:          2.064 m Patient Age:    73 years            BP:           120/60 mmHg Patient Gender: M                   HR:           71 bpm. Exam Location:    Procedure: 2D Echo, Cardiac Doppler and Color Doppler (Both Spectral and Color Flow Doppler were utilized during procedure).  Indications:    I20.89 Exertional angina; ; ; R06.02 SOB  History:        Patient has no prior history of Echocardiogram examinations. CAD, Signs/Symptoms:Dyspnea and Dizziness/Lightheadedness; Risk Factors:Hypertension, Diabetes, Dyslipidemia and Former Smoker.  Sonographer:    Alm Minerva Referring Phys: [id number] RYAN M DUNN  IMPRESSIONS   1. Left ventricular ejection fraction, by estimation, is 60 to 65%. Left ventricular ejection  fraction by 2D MOD biplane is 60.5 %. The left ventricle has normal function. The left ventricle has no regional wall motion abnormalities. Left ventricular diastolic parameters are consistent  with Grade I diastolic dysfunction (impaired relaxation). 2. Right ventricular systolic function is normal. The right ventricular size is normal. 3. The mitral valve is normal in structure. Mild mitral valve regurgitation. 4. The aortic valve is tricuspid. Aortic valve regurgitation is not visualized. Aortic valve sclerosis/calcification is present, without any evidence of aortic stenosis. Aortic valve mean gradient measures 6.0 mmHg. 5. The inferior vena cava is normal in size with greater than 50% respiratory variability, suggesting right atrial pressure of 3 mmHg.  FINDINGS Left Ventricle: Left ventricular ejection fraction, by estimation, is 60 to 65%. Left ventricular ejection fraction by 2D MOD biplane is 60.5 %. The left ventricle has normal function. The left ventricle has no regional wall motion abnormalities. The left ventricular internal cavity size was normal in size. There is no left ventricular hypertrophy. Left ventricular diastolic parameters are consistent with Grade I diastolic dysfunction (impaired relaxation).  Right Ventricle: The right ventricular size is normal. No increase in right ventricular wall thickness. Right ventricular systolic function is normal.  Left Atrium: Left atrial size was normal in size.  Right Atrium: Right atrial size was normal in size.  Pericardium: There is no evidence of pericardial effusion.  Mitral Valve: The mitral valve is normal in structure. Mild mitral valve regurgitation.  Tricuspid Valve: The tricuspid valve is normal in structure. Tricuspid valve regurgitation is mild.  Aortic Valve: The aortic valve is tricuspid. Aortic valve regurgitation is not visualized. Aortic valve sclerosis/calcification is present, without any evidence of aortic stenosis.  Aortic valve mean gradient measures 6.0 mmHg. Aortic valve peak gradient measures 12.2 mmHg. Aortic valve area, by VTI measures 2.27 cm.  Pulmonic Valve: The pulmonic valve was normal in structure. Pulmonic valve regurgitation is not visualized.  Aorta: The aortic root and ascending aorta are structurally normal, with no evidence of dilitation.  Venous: The inferior vena cava is normal in size with greater than 50% respiratory variability, suggesting right atrial pressure of 3 mmHg.  IAS/Shunts: No atrial level shunt detected by color flow Doppler.   LEFT VENTRICLE PLAX 2D                        Biplane EF (MOD) LVIDd:         5.00 cm         LV Biplane EF:   Left LVIDs:         3.50 cm                          ventricular LV PW:         0.90 cm                          ejection LV IVS:        0.90 cm                          fraction by LVOT diam:     2.20 cm                          2D MOD LV SV:         79                               biplane is LV SV Index:  38                               60.5 %. LVOT Area:     3.80 cm Diastology LV e' medial:    6.42 cm/s LV Volumes (MOD)               LV E/e' medial:  10.1 LV vol d, MOD    75.7 ml       LV e' lateral:   8.81 cm/s A2C:                           LV E/e' lateral: 7.3 LV vol d, MOD    97.3 ml A4C: LV vol s, MOD    31.0 ml A2C: LV vol s, MOD    35.6 ml A4C: LV SV MOD A2C:   44.7 ml LV SV MOD A4C:   97.3 ml LV SV MOD BP:    56.3 ml  RIGHT VENTRICLE RV Basal diam:  2.90 cm RV Mid diam:    2.90 cm RV S prime:     10.40 cm/s TAPSE (M-mode): 2.0 cm  LEFT ATRIUM             Index        RIGHT ATRIUM           Index LA Vol (A2C):   71.6 ml 34.70 ml/m  RA Area:     16.70 cm LA Vol (A4C):   63.3 ml 30.67 ml/m  RA Volume:   43.40 ml  21.03 ml/m LA Biplane Vol: 67.9 ml 32.90 ml/m AORTIC VALVE AV Area (Vmax):    2.32 cm AV Area (Vmean):   2.24 cm AV Area (VTI):     2.27 cm AV Vmax:           175.00 cm/s AV  Vmean:          117.000 cm/s AV VTI:            0.348 m AV Peak Grad:      12.2 mmHg AV Mean Grad:      6.0 mmHg LVOT Vmax:         107.00 cm/s LVOT Vmean:        68.800 cm/s LVOT VTI:          0.208 m LVOT/AV VTI ratio: 0.60  AORTA Ao Sinus diam: 3.30 cm Ao STJ diam:   2.6 cm Ao Asc diam:   3.00 cm  MITRAL VALVE               TRICUSPID VALVE MV Area (PHT): 2.50 cm    TR Peak grad:   13.1 mmHg MV Decel Time: 303 msec    TR Vmax:        181.00 cm/s MV E velocity: 64.60 cm/s MV A velocity: 84.10 cm/s  SHUNTS MV E/A ratio:  0.77        Systemic VTI:  0.21 m Systemic Diam: 2.20 cm  Redell Cave MD Electronically signed by Redell Cave MD Signature Date/Time: 08/26/2023/4:26:01 PM    Final          ______________________________________________________________________________________________    ***   EKG: *** I have independently reviewed the above radiologic studies and discussed with the patient   Recent Lab Findings: Lab Results  Component Value Date   WBC 10.4 08/22/2023   HGB 15.2 08/22/2023   HCT 46.6  08/22/2023   PLT 302 08/22/2023   GLUCOSE 204 (H) 08/22/2023   CHOL 125 05/30/2023   TRIG 115.0 05/30/2023   HDL 38.40 (L) 05/30/2023   LDLDIRECT 90.0 05/21/2022   LDLCALC 64 05/30/2023   ALT 16 05/30/2023   AST 16 05/30/2023   NA 139 08/22/2023   K 4.1 08/26/2023   CL 101 08/22/2023   CREATININE 1.05 08/22/2023   BUN 12 08/22/2023   CO2 20 08/22/2023   TSH 1.18 05/30/2023   HGBA1C 6.0 (A) 05/30/2023      Assessment / Plan:   73 y.o. male with ***     I  spent {CHL ONC TIME VISIT - DTPQU:[id number]} counseling the patient face to face.   Linnie MALVA Rayas 09/25/2023 4:14 PM

## 2023-09-27 ENCOUNTER — Other Ambulatory Visit: Payer: Self-pay | Admitting: *Deleted

## 2023-09-27 ENCOUNTER — Encounter: Payer: Self-pay | Admitting: Thoracic Surgery (Cardiothoracic Vascular Surgery)

## 2023-09-27 ENCOUNTER — Ambulatory Visit
Attending: Thoracic Surgery (Cardiothoracic Vascular Surgery) | Admitting: Thoracic Surgery (Cardiothoracic Vascular Surgery)

## 2023-09-27 ENCOUNTER — Encounter: Payer: Self-pay | Admitting: *Deleted

## 2023-09-27 VITALS — BP 160/70 | HR 82 | Resp 20 | Ht 68.0 in | Wt 198.1 lb

## 2023-09-27 DIAGNOSIS — I25118 Atherosclerotic heart disease of native coronary artery with other forms of angina pectoris: Secondary | ICD-10-CM | POA: Diagnosis not present

## 2023-09-30 ENCOUNTER — Ambulatory Visit: Admit: 2023-09-30 | Admitting: Ophthalmology

## 2023-09-30 SURGERY — PHACOEMULSIFICATION, CATARACT, WITH IOL INSERTION
Anesthesia: Topical | Laterality: Left

## 2023-10-04 NOTE — Pre-Procedure Instructions (Signed)
 Surgical Instructions   Your procedure is scheduled on October 09, 2023. Report to [facility] Main Entrance A at 5:30 A.M., then check in with the Admitting office. Any questions or running late day of surgery: call [phone]  Questions prior to your surgery date: call [phone], Monday-Friday, 8am-4pm. If you experience any cold or flu symptoms such as cough, fever, chills, shortness of breath, etc. between now and your scheduled surgery, please notify us  at the above number.     Remember:  Do not eat or drink after midnight the night before your surgery    Take these medicines the morning of surgery with A SIP OF WATER: atorvastatin  (LIPITOR)  fluticasone-salmeterol (ADVAIR)  isosorbide  mononitrate (IMDUR )    May take these medicines IF NEEDED: nitroGLYCERIN  (NITROSTAT )  pantoprazole (PROTONIX)    STOP taking your Omega-3 Fatty Acids (FISH OIL PO) one week prior to surgery. DO NOT take any doses after July 22nd.  STOP taking your enalapril  (VASOTEC ) two days prior to surgery. Your last dose will be July 27th.  Continue taking your Aspirin  through the day before surgery. DO NOT take any the morning of surgery.   One week prior to surgery, STOP taking any Aleve, Naproxen, Ibuprofen, Motrin, Advil, Goody's, BC's, all herbal medications and non-prescription vitamins.   WHAT DO I DO ABOUT MY DIABETES MEDICATION?   STOP taking your metFORMIN  (GLUCOPHAGE ) two days prior to surgery. Your last dose will be July 27th.   HOW TO MANAGE YOUR DIABETES BEFORE AND AFTER SURGERY  Why is it important to control my blood sugar before and after surgery? Improving blood sugar levels before and after surgery helps healing and can limit problems. A way of improving blood sugar control is eating a healthy diet by:  Eating less sugar and carbohydrates  Increasing activity/exercise  Talking with your doctor about reaching your blood sugar goals High blood sugars (greater than 180  mg/dL) can raise your risk of infections and slow your recovery, so you will need to focus on controlling your diabetes during the weeks before surgery. Make sure that the doctor who takes care of your diabetes knows about your planned surgery including the date and location.  How do I manage my blood sugar before surgery? Check your blood sugar at least 4 times a day, starting 2 days before surgery, to make sure that the level is not too high or low.  Check your blood sugar the morning of your surgery when you wake up and every 2 hours until you get to the Short Stay unit.  If your blood sugar is less than 70 mg/dL, you will need to treat for low blood sugar: Do not take insulin. Treat a low blood sugar (less than 70 mg/dL) with  cup of clear juice (cranberry or apple), 4 glucose tablets, OR glucose gel. Recheck blood sugar in 15 minutes after treatment (to make sure it is greater than 70 mg/dL). If your blood sugar is not greater than 70 mg/dL on recheck, call [phone] for further instructions. Report your blood sugar to the short stay nurse when you get to Short Stay.  If you are admitted to the hospital after surgery: Your blood sugar will be checked by the staff and you will probably be given insulin after surgery (instead of oral diabetes medicines) to make sure you have good blood sugar levels. The goal for blood sugar control after surgery is 80-180 mg/dL.  Do NOT Smoke (Tobacco/Vaping) for 24 hours prior to your procedure.  If you use a CPAP at night, you may bring your mask/headgear for your overnight stay.   You will be asked to remove any contacts, glasses, piercing's, hearing aid's, dentures/partials prior to surgery. Please bring cases for these items if needed.    Patients discharged the day of surgery will not be allowed to drive home, and someone needs to stay with them for 24 hours.  SURGICAL WAITING ROOM VISITATION Patients may have no more  than 2 support people in the waiting area - these visitors may rotate.   Pre-op nurse will coordinate an appropriate time for 1 ADULT support person, who may not rotate, to accompany patient in pre-op.  Children under the age of 57 must have an adult with them who is not the patient and must remain in the main waiting area with an adult.  If the patient needs to stay at the hospital during part of their recovery, the visitor guidelines for inpatient rooms apply.  Please refer to the [facility] website for the visitor guidelines for any additional information.   If you received a COVID test during your pre-op visit  it is requested that you wear a mask when out in public, stay away from anyone that may not be feeling well and notify your surgeon if you develop symptoms. If you have been in contact with anyone that has tested positive in the last 10 days please notify you surgeon.      Pre-operative CHG Bathing Instructions   You can play a key role in reducing the risk of infection after surgery. Your skin needs to be as free of germs as possible. You can reduce the number of germs on your skin by washing with CHG (chlorhexidine gluconate) soap before surgery. CHG is an antiseptic soap that kills germs and continues to kill germs even after washing.   DO NOT use if you have an allergy to chlorhexidine/CHG or antibacterial soaps. If your skin becomes reddened or irritated, stop using the CHG and notify one of our RNs at [phone].              TAKE A SHOWER THE NIGHT BEFORE SURGERY AND THE DAY OF SURGERY    Please keep in mind the following:  DO NOT shave, including legs and underarms, 48 hours prior to surgery.   You may shave your face before/day of surgery.  Place clean sheets on your bed the night before surgery Use a clean washcloth (not used since being washed) for each shower. DO NOT sleep with pet's night before surgery.  CHG Shower Instructions:  Wash your face and private  area with normal soap. If you choose to wash your hair, wash first with your normal shampoo.  After you use shampoo/soap, rinse your hair and body thoroughly to remove shampoo/soap residue.  Turn the water OFF and apply half the bottle of CHG soap to a CLEAN washcloth.  Apply CHG soap ONLY FROM YOUR NECK DOWN TO YOUR TOES (washing for 3-5 minutes)  DO NOT use CHG soap on face, private areas, open wounds, or sores.  Pay special attention to the area where your surgery is being performed.  If you are having back surgery, having someone wash your back for you may be helpful. Wait 2 minutes after CHG soap is applied, then you may rinse off the CHG soap.  Pat dry with a clean towel  Put on clean pajamas  Additional instructions for the day of surgery: DO NOT APPLY any lotions, deodorants, cologne, or perfumes.   Do not wear jewelry or makeup Do not wear nail polish, gel polish, artificial nails, or any other type of covering on natural nails (fingers and toes) Do not bring valuables to the hospital. [facility] is not responsible for valuables/personal belongings. Put on clean/comfortable clothes.  Please brush your teeth.  Ask your nurse before applying any prescription medications to the skin.

## 2023-10-07 ENCOUNTER — Ambulatory Visit (HOSPITAL_BASED_OUTPATIENT_CLINIC_OR_DEPARTMENT_OTHER)
Admission: RE | Admit: 2023-10-07 | Discharge: 2023-10-07 | Disposition: A | Discharge status: Home / Self Care | Source: Ambulatory Visit | Attending: Thoracic Surgery (Cardiothoracic Vascular Surgery) | Admitting: Thoracic Surgery (Cardiothoracic Vascular Surgery)

## 2023-10-07 ENCOUNTER — Encounter (HOSPITAL_COMMUNITY): Payer: Self-pay

## 2023-10-07 ENCOUNTER — Other Ambulatory Visit: Payer: Self-pay

## 2023-10-07 ENCOUNTER — Ambulatory Visit (HOSPITAL_COMMUNITY)
Admission: RE | Admit: 2023-10-07 | Discharge: 2023-10-07 | Disposition: A | Discharge status: Home / Self Care | Source: Ambulatory Visit | Attending: Thoracic Surgery (Cardiothoracic Vascular Surgery)

## 2023-10-07 ENCOUNTER — Encounter (HOSPITAL_COMMUNITY)
Admission: RE | Admit: 2023-10-07 | Discharge: 2023-10-07 | Disposition: A | Discharge status: Home / Self Care | Source: Ambulatory Visit | Attending: Thoracic Surgery (Cardiothoracic Vascular Surgery)

## 2023-10-07 VITALS — BP 133/85 | HR 61 | Temp 97.7°F | Resp 18 | Ht 68.0 in | Wt 196.0 lb

## 2023-10-07 DIAGNOSIS — E785 Hyperlipidemia, unspecified: Secondary | ICD-10-CM | POA: Diagnosis not present

## 2023-10-07 DIAGNOSIS — J8489 Other specified interstitial pulmonary diseases: Secondary | ICD-10-CM | POA: Diagnosis not present

## 2023-10-07 DIAGNOSIS — Z01818 Encounter for other preprocedural examination: Secondary | ICD-10-CM | POA: Insufficient documentation

## 2023-10-07 DIAGNOSIS — Z7984 Long term (current) use of oral hypoglycemic drugs: Secondary | ICD-10-CM | POA: Diagnosis not present

## 2023-10-07 DIAGNOSIS — K219 Gastro-esophageal reflux disease without esophagitis: Secondary | ICD-10-CM | POA: Diagnosis not present

## 2023-10-07 DIAGNOSIS — Z8249 Family history of ischemic heart disease and other diseases of the circulatory system: Secondary | ICD-10-CM | POA: Diagnosis not present

## 2023-10-07 DIAGNOSIS — Z79899 Other long term (current) drug therapy: Secondary | ICD-10-CM | POA: Diagnosis not present

## 2023-10-07 DIAGNOSIS — J9811 Atelectasis: Secondary | ICD-10-CM | POA: Diagnosis not present

## 2023-10-07 DIAGNOSIS — E119 Type 2 diabetes mellitus without complications: Secondary | ICD-10-CM | POA: Diagnosis not present

## 2023-10-07 DIAGNOSIS — I25118 Atherosclerotic heart disease of native coronary artery with other forms of angina pectoris: Secondary | ICD-10-CM

## 2023-10-07 DIAGNOSIS — I5032 Chronic diastolic (congestive) heart failure: Secondary | ICD-10-CM | POA: Diagnosis not present

## 2023-10-07 DIAGNOSIS — I110 Hypertensive heart disease with heart failure: Secondary | ICD-10-CM | POA: Diagnosis not present

## 2023-10-07 DIAGNOSIS — Z7951 Long term (current) use of inhaled steroids: Secondary | ICD-10-CM | POA: Diagnosis not present

## 2023-10-07 HISTORY — DX: Interstitial pulmonary disease, unspecified: J84.9

## 2023-10-07 HISTORY — DX: Unspecified malignant neoplasm of skin, unspecified: C44.90

## 2023-10-07 LAB — URINALYSIS, ROUTINE W REFLEX MICROSCOPIC
Bilirubin Urine: NEGATIVE
Glucose, UA: NEGATIVE mg/dL
Hgb urine dipstick: NEGATIVE
Ketones, ur: NEGATIVE mg/dL
Leukocytes,Ua: NEGATIVE
Nitrite: NEGATIVE
Protein, ur: NEGATIVE mg/dL
Specific Gravity, Urine: 1.006 (ref 1.005–1.030)
pH: 7 (ref 5.0–8.0)

## 2023-10-07 LAB — CBC
HCT: 43.1 % (ref 39.0–52.0)
Hemoglobin: 14.5 g/dL (ref 13.0–17.0)
MCH: 32.2 pg (ref 26.0–34.0)
MCHC: 33.6 g/dL (ref 30.0–36.0)
MCV: 95.8 fL (ref 80.0–100.0)
Platelets: 294 K/uL (ref 150–400)
RBC: 4.5 MIL/uL (ref 4.22–5.81)
RDW: 12.2 % (ref 11.5–15.5)
WBC: 9.7 K/uL (ref 4.0–10.5)
nRBC: 0 % (ref 0.0–0.2)

## 2023-10-07 LAB — TYPE AND SCREEN
ABO/RH(D): O POS
Antibody Screen: NEGATIVE

## 2023-10-07 LAB — PROTIME-INR
INR: 1 (ref 0.8–1.2)
Prothrombin Time: 13.9 s (ref 11.4–15.2)

## 2023-10-07 LAB — SURGICAL PCR SCREEN

## 2023-10-07 LAB — COMPREHENSIVE METABOLIC PANEL WITH GFR
ALT: 16 U/L (ref 0–44)
AST: 17 U/L (ref 15–41)
Albumin: 4.2 g/dL (ref 3.5–5.0)
Alkaline Phosphatase: 70 U/L (ref 38–126)
Anion gap: 8 (ref 5–15)
BUN: 11 mg/dL (ref 8–23)
CO2: 28 mmol/L (ref 22–32)
Calcium: 9.8 mg/dL (ref 8.9–10.3)
Chloride: 102 mmol/L (ref 98–111)
Creatinine, Ser: 1.04 mg/dL (ref 0.61–1.24)
GFR, Estimated: >60 mL/min (ref >60)
Glucose, Bld: 133 mg/dL — ABNORMAL HIGH (ref 70–99)
Potassium: 4.5 mmol/L (ref 3.5–5.1)
Sodium: 138 mmol/L (ref 135–145)
Total Bilirubin: 0.8 mg/dL (ref 0.0–1.2)
Total Protein: 6.8 g/dL (ref 6.5–8.1)

## 2023-10-07 LAB — GLUCOSE, CAPILLARY: Glucose-Capillary: 161 mg/dL — ABNORMAL HIGH (ref 70–99)

## 2023-10-07 LAB — HEMOGLOBIN A1C
Hgb A1c MFr Bld: 6.4 % — ABNORMAL HIGH (ref 4.8–5.6)
Mean Plasma Glucose: 136.98 mg/dL

## 2023-10-07 LAB — APTT: aPTT: 29 s (ref 24–36)

## 2023-10-07 NOTE — Progress Notes (Signed)
 PCP - Lynwood Crandall, NP Cardiologist - Dr. Lonni End - last office visit 08/22/2023 Pulmonologist - Dr. Halina Picking - Last office visit 04/23/2023  PPM/ICD - Denies Device Orders - n/a Rep Notified - n/a  Chest x-ray - 10/07/2023 EKG - 10/07/2023 Stress Test - 08/15/2023 ECHO - 08/26/2023 Cardiac Cath - 09/02/2023  Sleep Study - Denies CPAP - n/a  Pt is DM2. He checks his blood sugar 1x/month. Normal fasting glucose when he was checking more frequently was 110s. CBG at pre-op appointment 161. A1c result pending.  Last dose of GLP1 agonist- n/a GLP1 instructions: n/a  Blood Thinner Instructions: n/a Aspirin  Instructions: Pt instructed to continue taking ASA through the day before surgery and none the morning of surgery  NPO after midnight  COVID TEST- n/a   Anesthesia review: Yes. EKG review. Hx of CAD, HTN and interstitial lung disease with NSIP.  Patient denies shortness of breath, fever, cough and chest pain at PAT appointment. Pt denies any respiratory illness/infection in the last two months.   All instructions explained to the patient, with a verbal understanding of the material. Patient agrees to go over the instructions while at home for a better understanding. Patient also instructed to self quarantine after being tested for COVID-19. The opportunity to ask questions was provided.

## 2023-10-08 MED ORDER — NITROGLYCERIN IN D5W 200-5 MCG/ML-% IV SOLN
2.0000 ug/min | INTRAVENOUS | Status: DC
  Filled 2023-10-08: qty 250

## 2023-10-08 MED ORDER — MILRINONE LACTATE IN DEXTROSE 20-5 MG/100ML-% IV SOLN
0.3000 ug/kg/min | INTRAVENOUS | Status: DC
  Filled 2023-10-08: qty 100

## 2023-10-08 MED ORDER — VANCOMYCIN HCL 1.5 G IV SOLR
1500.0000 mg | INTRAVENOUS | Status: AC
  Administered 2023-10-09: 1500 mg via INTRAVENOUS
  Filled 2023-10-08: qty 30

## 2023-10-08 MED ORDER — MANNITOL 20 % IV SOLN
INTRAVENOUS | Status: DC
  Filled 2023-10-08: qty 13

## 2023-10-08 MED ORDER — CEFAZOLIN SODIUM-DEXTROSE 2-4 GM/100ML-% IV SOLN
2.0000 g | INTRAVENOUS | Status: DC
  Filled 2023-10-08: qty 100

## 2023-10-08 MED ORDER — INSULIN REGULAR(HUMAN) IN NACL 100-0.9 UT/100ML-% IV SOLN
INTRAVENOUS | Status: AC
  Administered 2023-10-09: 4 [IU]/h via INTRAVENOUS
  Filled 2023-10-08: qty 100

## 2023-10-08 MED ORDER — TRANEXAMIC ACID 1000 MG/10ML IV SOLN
1.5000 mg/kg/h | INTRAVENOUS | Status: AC
  Administered 2023-10-09: 1.5 mg/kg/h via INTRAVENOUS
  Filled 2023-10-08: qty 25

## 2023-10-08 MED ORDER — PHENYLEPHRINE HCL-NACL 20-0.9 MG/250ML-% IV SOLN
30.0000 ug/min | INTRAVENOUS | Status: AC
  Administered 2023-10-09: 40 ug/min via INTRAVENOUS
  Filled 2023-10-08: qty 250

## 2023-10-08 MED ORDER — TRANEXAMIC ACID (OHS) PUMP PRIME SOLUTION
2.0000 mg/kg | INTRAVENOUS | Status: DC
  Filled 2023-10-08: qty 1.78

## 2023-10-08 MED ORDER — PLASMA-LYTE A IV SOLN
INTRAVENOUS | Status: DC
  Filled 2023-10-08: qty 2.5

## 2023-10-08 MED ORDER — NOREPINEPHRINE 4 MG/250ML-% IV SOLN
0.0000 ug/min | INTRAVENOUS | Status: DC
  Filled 2023-10-08: qty 250

## 2023-10-08 MED ORDER — CEFAZOLIN SODIUM-DEXTROSE 2-4 GM/100ML-% IV SOLN
2.0000 g | INTRAVENOUS | Status: AC
  Administered 2023-10-09 (×2): 2 g via INTRAVENOUS
  Filled 2023-10-08: qty 100

## 2023-10-08 MED ORDER — DEXMEDETOMIDINE HCL IN NACL 400 MCG/100ML IV SOLN
0.1000 ug/kg/h | INTRAVENOUS | Status: AC
  Administered 2023-10-09: .7 ug/kg/h via INTRAVENOUS
  Filled 2023-10-08: qty 100

## 2023-10-08 MED ORDER — TRANEXAMIC ACID (OHS) BOLUS VIA INFUSION
15.0000 mg/kg | INTRAVENOUS | Status: AC
  Administered 2023-10-09: 1333.5 mg via INTRAVENOUS
  Filled 2023-10-08: qty 1334

## 2023-10-08 MED ORDER — HEPARIN 30,000 UNITS/1000 ML (OHS) CELLSAVER SOLUTION
Status: DC
  Filled 2023-10-08: qty 1000

## 2023-10-08 MED ORDER — EPINEPHRINE HCL 5 MG/250ML IV SOLN IN NS
0.0000 ug/min | INTRAVENOUS | Status: DC
  Filled 2023-10-08: qty 250

## 2023-10-08 MED ORDER — POTASSIUM CHLORIDE 2 MEQ/ML IV SOLN
80.0000 meq | INTRAVENOUS | Status: DC
  Filled 2023-10-08: qty 40

## 2023-10-08 NOTE — Progress Notes (Signed)
 Surgical PCR result invalid. Will need to be recollected DOS. Order placed.

## 2023-10-09 ENCOUNTER — Inpatient Hospital Stay (HOSPITAL_COMMUNITY)

## 2023-10-09 ENCOUNTER — Other Ambulatory Visit: Payer: Self-pay

## 2023-10-09 ENCOUNTER — Inpatient Hospital Stay (HOSPITAL_COMMUNITY): Payer: Self-pay | Admitting: Certified Registered Nurse Anesthetist

## 2023-10-09 ENCOUNTER — Inpatient Hospital Stay (HOSPITAL_COMMUNITY)
Admission: RE | Disposition: A | Discharge status: Home / Self Care | Payer: Self-pay | Source: Home / Self Care | Attending: Thoracic Surgery (Cardiothoracic Vascular Surgery)

## 2023-10-09 ENCOUNTER — Encounter (HOSPITAL_COMMUNITY): Payer: Self-pay | Admitting: Thoracic Surgery (Cardiothoracic Vascular Surgery)

## 2023-10-09 ENCOUNTER — Inpatient Hospital Stay (HOSPITAL_COMMUNITY)
Admission: RE | Admit: 2023-10-09 | Discharge: 2023-10-13 | DRG: 236 | Disposition: A | Discharge status: Home Health | Attending: Thoracic Surgery (Cardiothoracic Vascular Surgery) | Admitting: Thoracic Surgery (Cardiothoracic Vascular Surgery)

## 2023-10-09 DIAGNOSIS — E785 Hyperlipidemia, unspecified: Secondary | ICD-10-CM | POA: Diagnosis not present

## 2023-10-09 DIAGNOSIS — Z951 Presence of aortocoronary bypass graft: Secondary | ICD-10-CM

## 2023-10-09 DIAGNOSIS — E119 Type 2 diabetes mellitus without complications: Secondary | ICD-10-CM | POA: Diagnosis present

## 2023-10-09 DIAGNOSIS — I10 Essential (primary) hypertension: Secondary | ICD-10-CM | POA: Diagnosis not present

## 2023-10-09 DIAGNOSIS — Z8673 Personal history of transient ischemic attack (TIA), and cerebral infarction without residual deficits: Secondary | ICD-10-CM

## 2023-10-09 DIAGNOSIS — Z7982 Long term (current) use of aspirin: Secondary | ICD-10-CM | POA: Diagnosis not present

## 2023-10-09 DIAGNOSIS — I2510 Atherosclerotic heart disease of native coronary artery without angina pectoris: Secondary | ICD-10-CM | POA: Diagnosis not present

## 2023-10-09 DIAGNOSIS — I2584 Coronary atherosclerosis due to calcified coronary lesion: Secondary | ICD-10-CM | POA: Diagnosis not present

## 2023-10-09 DIAGNOSIS — J8489 Other specified interstitial pulmonary diseases: Secondary | ICD-10-CM | POA: Diagnosis not present

## 2023-10-09 DIAGNOSIS — Z4682 Encounter for fitting and adjustment of non-vascular catheter: Secondary | ICD-10-CM | POA: Diagnosis not present

## 2023-10-09 DIAGNOSIS — Z7951 Long term (current) use of inhaled steroids: Secondary | ICD-10-CM | POA: Diagnosis not present

## 2023-10-09 DIAGNOSIS — I25118 Atherosclerotic heart disease of native coronary artery with other forms of angina pectoris: Principal | ICD-10-CM | POA: Diagnosis present

## 2023-10-09 DIAGNOSIS — Z85820 Personal history of malignant melanoma of skin: Secondary | ICD-10-CM

## 2023-10-09 DIAGNOSIS — Z48812 Encounter for surgical aftercare following surgery on the circulatory system: Secondary | ICD-10-CM | POA: Diagnosis not present

## 2023-10-09 DIAGNOSIS — I110 Hypertensive heart disease with heart failure: Secondary | ICD-10-CM | POA: Diagnosis present

## 2023-10-09 DIAGNOSIS — Z813 Family history of other psychoactive substance abuse and dependence: Secondary | ICD-10-CM | POA: Diagnosis not present

## 2023-10-09 DIAGNOSIS — I5032 Chronic diastolic (congestive) heart failure: Secondary | ICD-10-CM | POA: Diagnosis not present

## 2023-10-09 DIAGNOSIS — Z01818 Encounter for other preprocedural examination: Principal | ICD-10-CM

## 2023-10-09 DIAGNOSIS — Z800 Family history of malignant neoplasm of digestive organs: Secondary | ICD-10-CM

## 2023-10-09 DIAGNOSIS — J90 Pleural effusion, not elsewhere classified: Secondary | ICD-10-CM | POA: Diagnosis not present

## 2023-10-09 DIAGNOSIS — Z85828 Personal history of other malignant neoplasm of skin: Secondary | ICD-10-CM

## 2023-10-09 DIAGNOSIS — J9811 Atelectasis: Secondary | ICD-10-CM | POA: Diagnosis not present

## 2023-10-09 DIAGNOSIS — K219 Gastro-esophageal reflux disease without esophagitis: Secondary | ICD-10-CM | POA: Diagnosis not present

## 2023-10-09 DIAGNOSIS — I493 Ventricular premature depolarization: Secondary | ICD-10-CM | POA: Diagnosis not present

## 2023-10-09 DIAGNOSIS — Z87891 Personal history of nicotine dependence: Secondary | ICD-10-CM

## 2023-10-09 DIAGNOSIS — J939 Pneumothorax, unspecified: Secondary | ICD-10-CM | POA: Diagnosis not present

## 2023-10-09 DIAGNOSIS — Z808 Family history of malignant neoplasm of other organs or systems: Secondary | ICD-10-CM

## 2023-10-09 DIAGNOSIS — Z818 Family history of other mental and behavioral disorders: Secondary | ICD-10-CM

## 2023-10-09 DIAGNOSIS — Z803 Family history of malignant neoplasm of breast: Secondary | ICD-10-CM | POA: Diagnosis not present

## 2023-10-09 DIAGNOSIS — R918 Other nonspecific abnormal finding of lung field: Secondary | ICD-10-CM | POA: Diagnosis not present

## 2023-10-09 DIAGNOSIS — Z9861 Coronary angioplasty status: Secondary | ICD-10-CM

## 2023-10-09 DIAGNOSIS — Z79899 Other long term (current) drug therapy: Secondary | ICD-10-CM | POA: Diagnosis not present

## 2023-10-09 DIAGNOSIS — I340 Nonrheumatic mitral (valve) insufficiency: Secondary | ICD-10-CM | POA: Diagnosis not present

## 2023-10-09 DIAGNOSIS — Z7984 Long term (current) use of oral hypoglycemic drugs: Secondary | ICD-10-CM

## 2023-10-09 DIAGNOSIS — Z8249 Family history of ischemic heart disease and other diseases of the circulatory system: Secondary | ICD-10-CM

## 2023-10-09 DIAGNOSIS — Z452 Encounter for adjustment and management of vascular access device: Secondary | ICD-10-CM | POA: Diagnosis not present

## 2023-10-09 HISTORY — PX: TEE WITHOUT CARDIOVERSION: SHX5443

## 2023-10-09 HISTORY — PX: CORONARY ARTERY BYPASS GRAFT: SHX141

## 2023-10-09 HISTORY — DX: Presence of aortocoronary bypass graft: Z95.1

## 2023-10-09 LAB — POCT I-STAT 7, (LYTES, BLD GAS, ICA,H+H)
Acid-base deficit: 3 mmol/L — ABNORMAL HIGH (ref 0.0–2.0)
Acid-base deficit: 2 mmol/L (ref 0.0–2.0)
Acid-base deficit: 1 mmol/L (ref 0.0–2.0)
Acid-base deficit: 3 mmol/L — ABNORMAL HIGH (ref 0.0–2.0)
Acid-base deficit: 4 mmol/L — ABNORMAL HIGH (ref 0.0–2.0)
Acid-base deficit: 6 mmol/L — ABNORMAL HIGH (ref 0.0–2.0)
Bicarbonate: 21.4 mmol/L (ref 20.0–28.0)
Bicarbonate: 22.5 mmol/L (ref 20.0–28.0)
Bicarbonate: 24.3 mmol/L (ref 20.0–28.0)
Bicarbonate: 22.9 mmol/L (ref 20.0–28.0)
Bicarbonate: 23.3 mmol/L (ref 20.0–28.0)
Bicarbonate: 20.4 mmol/L (ref 20.0–28.0)
Calcium, Ion: 1.13 mmol/L — ABNORMAL LOW (ref 1.15–1.40)
Calcium, Ion: 1 mmol/L — ABNORMAL LOW (ref 1.15–1.40)
Calcium, Ion: 1.29 mmol/L (ref 1.15–1.40)
Calcium, Ion: 1.2 mmol/L (ref 1.15–1.40)
Calcium, Ion: 1.18 mmol/L (ref 1.15–1.40)
Calcium, Ion: 1.12 mmol/L — ABNORMAL LOW (ref 1.15–1.40)
HCT: 31 % — ABNORMAL LOW (ref 39.0–52.0)
HCT: 26 % — ABNORMAL LOW (ref 39.0–52.0)
HCT: 29 % — ABNORMAL LOW (ref 39.0–52.0)
HCT: 27 % — ABNORMAL LOW (ref 39.0–52.0)
HCT: 31 % — ABNORMAL LOW (ref 39.0–52.0)
HCT: 30 % — ABNORMAL LOW (ref 39.0–52.0)
Hemoglobin: 10.5 g/dL — ABNORMAL LOW (ref 13.0–17.0)
Hemoglobin: 8.8 g/dL — ABNORMAL LOW (ref 13.0–17.0)
Hemoglobin: 9.9 g/dL — ABNORMAL LOW (ref 13.0–17.0)
Hemoglobin: 9.2 g/dL — ABNORMAL LOW (ref 13.0–17.0)
Hemoglobin: 10.5 g/dL — ABNORMAL LOW (ref 13.0–17.0)
Hemoglobin: 10.2 g/dL — ABNORMAL LOW (ref 13.0–17.0)
O2 Saturation: 100 %
O2 Saturation: 100 %
O2 Saturation: 99 %
O2 Saturation: 97 %
O2 Saturation: 96 %
O2 Saturation: 96 %
Patient temperature: 36.3
Patient temperature: 35.9
Patient temperature: 36
Potassium: 3 mmol/L — ABNORMAL LOW (ref 3.5–5.1)
Potassium: 3.7 mmol/L (ref 3.5–5.1)
Potassium: 3.5 mmol/L (ref 3.5–5.1)
Potassium: 2.8 mmol/L — ABNORMAL LOW (ref 3.5–5.1)
Potassium: 3.1 mmol/L — ABNORMAL LOW (ref 3.5–5.1)
Potassium: 3.2 mmol/L — ABNORMAL LOW (ref 3.5–5.1)
Sodium: 142 mmol/L (ref 135–145)
Sodium: 138 mmol/L (ref 135–145)
Sodium: 140 mmol/L (ref 135–145)
Sodium: 143 mmol/L (ref 135–145)
Sodium: 143 mmol/L (ref 135–145)
Sodium: 143 mmol/L (ref 135–145)
TCO2: 22 mmol/L (ref 22–32)
TCO2: 24 mmol/L (ref 22–32)
TCO2: 26 mmol/L (ref 22–32)
TCO2: 24 mmol/L (ref 22–32)
TCO2: 25 mmol/L (ref 22–32)
TCO2: 22 mmol/L (ref 22–32)
pCO2 arterial: 35.6 mmHg (ref 32–48)
pCO2 arterial: 38.2 mmHg (ref 32–48)
pCO2 arterial: 41 mmHg (ref 32–48)
pCO2 arterial: 43.4 mmHg (ref 32–48)
pCO2 arterial: 47.4 mmHg (ref 32–48)
pCO2 arterial: 42.6 mmHg (ref 32–48)
pH, Arterial: 7.386 (ref 7.35–7.45)
pH, Arterial: 7.379 (ref 7.35–7.45)
pH, Arterial: 7.381 (ref 7.35–7.45)
pH, Arterial: 7.326 — ABNORMAL LOW (ref 7.35–7.45)
pH, Arterial: 7.294 — ABNORMAL LOW (ref 7.35–7.45)
pH, Arterial: 7.282 — ABNORMAL LOW (ref 7.35–7.45)
pO2, Arterial: 210 mmHg — ABNORMAL HIGH (ref 83–108)
pO2, Arterial: 419 mmHg — ABNORMAL HIGH (ref 83–108)
pO2, Arterial: 156 mmHg — ABNORMAL HIGH (ref 83–108)
pO2, Arterial: 92 mmHg (ref 83–108)
pO2, Arterial: 85 mmHg (ref 83–108)
pO2, Arterial: 85 mmHg (ref 83–108)

## 2023-10-09 LAB — POCT I-STAT EG7
Acid-base deficit: 3 mmol/L — ABNORMAL HIGH (ref 0.0–2.0)
Bicarbonate: 22.5 mmol/L (ref 20.0–28.0)
Calcium, Ion: 1.05 mmol/L — ABNORMAL LOW (ref 1.15–1.40)
HCT: 27 % — ABNORMAL LOW (ref 39.0–52.0)
Hemoglobin: 9.2 g/dL — ABNORMAL LOW (ref 13.0–17.0)
O2 Saturation: 80 %
Potassium: 3.7 mmol/L (ref 3.5–5.1)
Sodium: 139 mmol/L (ref 135–145)
TCO2: 24 mmol/L (ref 22–32)
pCO2, Ven: 40.7 mmHg — ABNORMAL LOW (ref 44–60)
pH, Ven: 7.351 (ref 7.25–7.43)
pO2, Ven: 47 mmHg — ABNORMAL HIGH (ref 32–45)

## 2023-10-09 LAB — POCT I-STAT, CHEM 8
BUN: 9 mg/dL (ref 8–23)
BUN: 9 mg/dL (ref 8–23)
BUN: 8 mg/dL (ref 8–23)
BUN: 8 mg/dL (ref 8–23)
Calcium, Ion: 1.14 mmol/L — ABNORMAL LOW (ref 1.15–1.40)
Calcium, Ion: 1.23 mmol/L (ref 1.15–1.40)
Calcium, Ion: 1.09 mmol/L — ABNORMAL LOW (ref 1.15–1.40)
Calcium, Ion: 1.3 mmol/L (ref 1.15–1.40)
Chloride: 105 mmol/L (ref 98–111)
Chloride: 101 mmol/L (ref 98–111)
Chloride: 102 mmol/L (ref 98–111)
Chloride: 103 mmol/L (ref 98–111)
Creatinine, Ser: 0.7 mg/dL (ref 0.61–1.24)
Creatinine, Ser: 0.8 mg/dL (ref 0.61–1.24)
Creatinine, Ser: 0.7 mg/dL (ref 0.61–1.24)
Creatinine, Ser: 0.7 mg/dL (ref 0.61–1.24)
Glucose, Bld: 140 mg/dL — ABNORMAL HIGH (ref 70–99)
Glucose, Bld: 147 mg/dL — ABNORMAL HIGH (ref 70–99)
Glucose, Bld: 111 mg/dL — ABNORMAL HIGH (ref 70–99)
Glucose, Bld: 135 mg/dL — ABNORMAL HIGH (ref 70–99)
HCT: 32 % — ABNORMAL LOW (ref 39.0–52.0)
HCT: 33 % — ABNORMAL LOW (ref 39.0–52.0)
HCT: 26 % — ABNORMAL LOW (ref 39.0–52.0)
HCT: 30 % — ABNORMAL LOW (ref 39.0–52.0)
Hemoglobin: 10.9 g/dL — ABNORMAL LOW (ref 13.0–17.0)
Hemoglobin: 11.2 g/dL — ABNORMAL LOW (ref 13.0–17.0)
Hemoglobin: 8.8 g/dL — ABNORMAL LOW (ref 13.0–17.0)
Hemoglobin: 10.2 g/dL — ABNORMAL LOW (ref 13.0–17.0)
Potassium: 2.9 mmol/L — ABNORMAL LOW (ref 3.5–5.1)
Potassium: 3.5 mmol/L (ref 3.5–5.1)
Potassium: 3.9 mmol/L (ref 3.5–5.1)
Potassium: 3.6 mmol/L (ref 3.5–5.1)
Sodium: 142 mmol/L (ref 135–145)
Sodium: 139 mmol/L (ref 135–145)
Sodium: 138 mmol/L (ref 135–145)
Sodium: 139 mmol/L (ref 135–145)
TCO2: 21 mmol/L — ABNORMAL LOW (ref 22–32)
TCO2: 27 mmol/L (ref 22–32)
TCO2: 26 mmol/L (ref 22–32)
TCO2: 23 mmol/L (ref 22–32)

## 2023-10-09 LAB — SURGICAL PCR SCREEN
MRSA, PCR: NEGATIVE
Staphylococcus aureus: NEGATIVE

## 2023-10-09 LAB — CBC
HCT: 32 % — ABNORMAL LOW (ref 39.0–52.0)
HCT: 36.2 % — ABNORMAL LOW (ref 39.0–52.0)
Hemoglobin: 10.8 g/dL — ABNORMAL LOW (ref 13.0–17.0)
Hemoglobin: 12.4 g/dL — ABNORMAL LOW (ref 13.0–17.0)
MCH: 32.4 pg (ref 26.0–34.0)
MCH: 32.7 pg (ref 26.0–34.0)
MCHC: 33.8 g/dL (ref 30.0–36.0)
MCHC: 34.3 g/dL (ref 30.0–36.0)
MCV: 96.1 fL (ref 80.0–100.0)
MCV: 95.5 fL (ref 80.0–100.0)
Platelets: 153 K/uL (ref 150–400)
Platelets: 193 K/uL (ref 150–400)
RBC: 3.33 MIL/uL — ABNORMAL LOW (ref 4.22–5.81)
RBC: 3.79 MIL/uL — ABNORMAL LOW (ref 4.22–5.81)
RDW: 12.1 % (ref 11.5–15.5)
RDW: 12.1 % (ref 11.5–15.5)
WBC: 13.5 K/uL — ABNORMAL HIGH (ref 4.0–10.5)
WBC: 17.6 K/uL — ABNORMAL HIGH (ref 4.0–10.5)
nRBC: 0 % (ref 0.0–0.2)
nRBC: 0 % (ref 0.0–0.2)

## 2023-10-09 LAB — BASIC METABOLIC PANEL WITH GFR
Anion gap: 10 (ref 5–15)
BUN: 9 mg/dL (ref 8–23)
CO2: 24 mmol/L (ref 22–32)
Calcium: 7.8 mg/dL — ABNORMAL LOW (ref 8.9–10.3)
Chloride: 106 mmol/L (ref 98–111)
Creatinine, Ser: 0.86 mg/dL (ref 0.61–1.24)
GFR, Estimated: >60 mL/min (ref >60)
Glucose, Bld: 121 mg/dL — ABNORMAL HIGH (ref 70–99)
Potassium: 3.8 mmol/L (ref 3.5–5.1)
Sodium: 140 mmol/L (ref 135–145)

## 2023-10-09 LAB — HEMOGLOBIN AND HEMATOCRIT, BLOOD
HCT: 28.9 % — ABNORMAL LOW (ref 39.0–52.0)
Hemoglobin: 9.8 g/dL — ABNORMAL LOW (ref 13.0–17.0)

## 2023-10-09 LAB — GLUCOSE, CAPILLARY
Glucose-Capillary: 155 mg/dL — ABNORMAL HIGH (ref 70–99)
Glucose-Capillary: 112 mg/dL — ABNORMAL HIGH (ref 70–99)
Glucose-Capillary: 93 mg/dL (ref 70–99)
Glucose-Capillary: 103 mg/dL — ABNORMAL HIGH (ref 70–99)
Glucose-Capillary: 105 mg/dL — ABNORMAL HIGH (ref 70–99)
Glucose-Capillary: 120 mg/dL — ABNORMAL HIGH (ref 70–99)
Glucose-Capillary: 114 mg/dL — ABNORMAL HIGH (ref 70–99)
Glucose-Capillary: 150 mg/dL — ABNORMAL HIGH (ref 70–99)
Glucose-Capillary: 121 mg/dL — ABNORMAL HIGH (ref 70–99)
Glucose-Capillary: 138 mg/dL — ABNORMAL HIGH (ref 70–99)

## 2023-10-09 LAB — PROTIME-INR
INR: 1.4 — ABNORMAL HIGH (ref 0.8–1.2)
Prothrombin Time: 17.5 s — ABNORMAL HIGH (ref 11.4–15.2)

## 2023-10-09 LAB — ABO/RH: ABO/RH(D): O POS

## 2023-10-09 LAB — PLATELET COUNT: Platelets: 189 K/uL (ref 150–400)

## 2023-10-09 LAB — APTT: aPTT: 34 s (ref 24–36)

## 2023-10-09 LAB — MAGNESIUM: Magnesium: 2.9 mg/dL — ABNORMAL HIGH (ref 1.7–2.4)

## 2023-10-09 SURGERY — CORONARY ARTERY BYPASS GRAFTING (CABG)
Anesthesia: General | Site: Chest

## 2023-10-09 MED ORDER — SODIUM CHLORIDE 0.9 % IV SOLN: INTRAVENOUS | Status: DC

## 2023-10-09 MED ORDER — METOPROLOL TARTRATE 5 MG/5ML IV SOLN: 2.5000 mg | INTRAVENOUS | Status: DC | PRN

## 2023-10-09 MED ORDER — MAGNESIUM SULFATE 4 GM/100ML IV SOLN
4.0000 g | Freq: Once | INTRAVENOUS | Status: AC
  Administered 2023-10-09: 4 g via INTRAVENOUS
  Filled 2023-10-09: qty 100

## 2023-10-09 MED ORDER — LACTATED RINGERS IV SOLN
INTRAVENOUS | Status: DC | PRN
Start: 2023-10-09 — End: 2023-10-09

## 2023-10-09 MED ORDER — METOPROLOL TARTRATE 12.5 MG HALF TABLET
12.5000 mg | ORAL_TABLET | Freq: Once | ORAL | Status: AC
  Administered 2023-10-09: 12.5 mg via ORAL
  Filled 2023-10-09: qty 1

## 2023-10-09 MED ORDER — SODIUM CHLORIDE 0.45 % IV SOLN: INTRAVENOUS | Status: DC | PRN

## 2023-10-09 MED ORDER — METOCLOPRAMIDE HCL 5 MG/ML IJ SOLN
10.0000 mg | Freq: Four times a day (QID) | INTRAMUSCULAR | Status: AC
  Administered 2023-10-09 – 2023-10-10 (×6): 10 mg via INTRAVENOUS
  Filled 2023-10-09 (×6): qty 2

## 2023-10-09 MED ORDER — MIDAZOLAM HCL (PF) 5 MG/ML IJ SOLN
INTRAMUSCULAR | Status: DC | PRN
  Administered 2023-10-09: 1 mg via INTRAVENOUS
  Administered 2023-10-09: 5 mg via INTRAVENOUS

## 2023-10-09 MED ORDER — POTASSIUM CHLORIDE 10 MEQ/50ML IV SOLN
10.0000 meq | INTRAVENOUS | Status: AC
  Administered 2023-10-09 (×3): 10 meq via INTRAVENOUS
  Filled 2023-10-09 (×3): qty 50

## 2023-10-09 MED ORDER — CHLORHEXIDINE GLUCONATE CLOTH 2 % EX PADS: 6.0000 | MEDICATED_PAD | Freq: Every day | CUTANEOUS | Status: DC

## 2023-10-09 MED ORDER — CHLORHEXIDINE GLUCONATE CLOTH 2 % EX PADS
6.0000 | MEDICATED_PAD | Freq: Every day | CUTANEOUS | Status: DC
  Administered 2023-10-09 – 2023-10-11 (×3): 6 via TOPICAL

## 2023-10-09 MED ORDER — SODIUM CHLORIDE 0.9% FLUSH: 10.0000 mL | INTRAVENOUS | Status: DC | PRN

## 2023-10-09 MED ORDER — LACTATED RINGERS IV SOLN: INTRAVENOUS | Status: DC | PRN

## 2023-10-09 MED ORDER — ASPIRIN 81 MG PO CHEW: 324.0000 mg | CHEWABLE_TABLET | Freq: Every day | ORAL | Status: DC

## 2023-10-09 MED ORDER — DEXMEDETOMIDINE HCL IN NACL 400 MCG/100ML IV SOLN
INTRAVENOUS | Status: AC
  Filled 2023-10-09: qty 100

## 2023-10-09 MED ORDER — METOPROLOL TARTRATE 25 MG/10 ML ORAL SUSPENSION: 12.5000 mg | Freq: Two times a day (BID) | ORAL | Status: DC

## 2023-10-09 MED ORDER — BISACODYL 10 MG RE SUPP: 10.0000 mg | Freq: Every day | RECTAL | Status: DC

## 2023-10-09 MED ORDER — DOCUSATE SODIUM 100 MG PO CAPS
200.0000 mg | ORAL_CAPSULE | Freq: Every day | ORAL | Status: DC
  Administered 2023-10-10 – 2023-10-12 (×3): 200 mg via ORAL
  Filled 2023-10-09 (×3): qty 2

## 2023-10-09 MED ORDER — 0.9 % SODIUM CHLORIDE (POUR BTL) OPTIME
TOPICAL | Status: DC | PRN
  Administered 2023-10-09: 5000 mL

## 2023-10-09 MED ORDER — PROPOFOL 10 MG/ML IV BOLUS
INTRAVENOUS | Status: AC
  Filled 2023-10-09: qty 20

## 2023-10-09 MED ORDER — HEPARIN SODIUM (PORCINE) 1000 UNIT/ML IJ SOLN
INTRAMUSCULAR | Status: DC | PRN
  Administered 2023-10-09: 32000 [IU] via INTRAVENOUS

## 2023-10-09 MED ORDER — ~~LOC~~ CARDIAC SURGERY, PATIENT & FAMILY EDUCATION
Freq: Once | Status: DC
  Filled 2023-10-09: qty 1

## 2023-10-09 MED ORDER — SODIUM BICARBONATE 8.4 % IV SOLN
50.0000 meq | Freq: Once | INTRAVENOUS | Status: AC
  Administered 2023-10-09: 50 meq via INTRAVENOUS

## 2023-10-09 MED ORDER — OXYCODONE HCL 5 MG PO TABS
5.0000 mg | ORAL_TABLET | ORAL | Status: DC | PRN
  Administered 2023-10-09: 5 mg via ORAL
  Administered 2023-10-09: 10 mg via ORAL
  Administered 2023-10-09 – 2023-10-10 (×3): 5 mg via ORAL
  Administered 2023-10-10: 10 mg via ORAL
  Administered 2023-10-10: 5 mg via ORAL
  Administered 2023-10-10: 10 mg via ORAL
  Filled 2023-10-09: qty 2
  Filled 2023-10-09: qty 1
  Filled 2023-10-09 (×2): qty 2
  Filled 2023-10-09 (×4): qty 1

## 2023-10-09 MED ORDER — VANCOMYCIN HCL IN DEXTROSE 1-5 GM/200ML-% IV SOLN
1000.0000 mg | Freq: Once | INTRAVENOUS | Status: AC
  Administered 2023-10-09: 1000 mg via INTRAVENOUS
  Filled 2023-10-09: qty 200

## 2023-10-09 MED ORDER — POTASSIUM CHLORIDE 10 MEQ/50ML IV SOLN
10.0000 meq | INTRAVENOUS | Status: AC
  Administered 2023-10-09 (×3): 10 meq via INTRAVENOUS

## 2023-10-09 MED ORDER — SODIUM CHLORIDE 0.9% FLUSH
10.0000 mL | Freq: Two times a day (BID) | INTRAVENOUS | Status: DC
  Administered 2023-10-09: 10 mL
  Administered 2023-10-09: 20 mL

## 2023-10-09 MED ORDER — MORPHINE SULFATE (PF) 2 MG/ML IV SOLN
1.0000 mg | INTRAVENOUS | Status: DC | PRN
  Administered 2023-10-09: 4 mg via INTRAVENOUS
  Administered 2023-10-09 – 2023-10-11 (×6): 2 mg via INTRAVENOUS
  Filled 2023-10-09 (×2): qty 1
  Filled 2023-10-09: qty 2
  Filled 2023-10-09 (×4): qty 1
  Filled 2023-10-09: qty 2

## 2023-10-09 MED ORDER — SODIUM CHLORIDE (PF) 0.9 % IJ SOLN: OROMUCOSAL | Status: DC | PRN

## 2023-10-09 MED ORDER — ORAL CARE MOUTH RINSE: 15.0000 mL | OROMUCOSAL | Status: DC | PRN

## 2023-10-09 MED ORDER — SODIUM CHLORIDE 0.9 % IV SOLN
INTRAVENOUS | Status: DC | PRN
Start: 2023-10-09 — End: 2023-10-09

## 2023-10-09 MED ORDER — ACETAMINOPHEN 160 MG/5ML PO SOLN: 1000.0000 mg | Freq: Four times a day (QID) | ORAL | Status: DC

## 2023-10-09 MED ORDER — PROTAMINE SULFATE 10 MG/ML IV SOLN
INTRAVENOUS | Status: DC | PRN
  Administered 2023-10-09: 290 mg via INTRAVENOUS
  Administered 2023-10-09: 20 mg via INTRAVENOUS
  Administered 2023-10-09: 10 mg via INTRAVENOUS

## 2023-10-09 MED ORDER — PLASMA-LYTE A IV SOLN: INTRAVENOUS | Status: DC | PRN

## 2023-10-09 MED ORDER — CHLORHEXIDINE GLUCONATE 0.12 % MT SOLN: 15.0000 mL | Freq: Once | OROMUCOSAL | Status: AC

## 2023-10-09 MED ORDER — LACTATED RINGERS IV SOLN: INTRAVENOUS | Status: DC

## 2023-10-09 MED ORDER — ROCURONIUM BROMIDE 10 MG/ML (PF) SYRINGE
PREFILLED_SYRINGE | INTRAVENOUS | Status: DC | PRN
  Administered 2023-10-09: 60 mg via INTRAVENOUS
  Administered 2023-10-09 (×2): 40 mg via INTRAVENOUS
  Administered 2023-10-09: 30 mg via INTRAVENOUS

## 2023-10-09 MED ORDER — ALBUMIN HUMAN 5 % IV SOLN: INTRAVENOUS | Status: DC | PRN

## 2023-10-09 MED ORDER — FENTANYL CITRATE (PF) 250 MCG/5ML IJ SOLN
INTRAMUSCULAR | Status: AC
  Filled 2023-10-09: qty 5

## 2023-10-09 MED ORDER — METOPROLOL TARTRATE 12.5 MG HALF TABLET
12.5000 mg | ORAL_TABLET | Freq: Two times a day (BID) | ORAL | Status: DC
  Administered 2023-10-09 – 2023-10-10 (×3): 12.5 mg via ORAL
  Filled 2023-10-09 (×3): qty 1

## 2023-10-09 MED ORDER — ORAL CARE MOUTH RINSE
15.0000 mL | OROMUCOSAL | Status: DC
  Administered 2023-10-09: 15 mL via OROMUCOSAL

## 2023-10-09 MED ORDER — NITROGLYCERIN IN D5W 200-5 MCG/ML-% IV SOLN
0.0000 ug/min | INTRAVENOUS | Status: DC
  Administered 2023-10-09: 5 ug/min via INTRAVENOUS

## 2023-10-09 MED ORDER — DEXMEDETOMIDINE HCL IN NACL 400 MCG/100ML IV SOLN
0.0000 ug/kg/h | INTRAVENOUS | Status: DC
  Administered 2023-10-10: 0.1 ug/kg/h via INTRAVENOUS
  Filled 2023-10-09: qty 100

## 2023-10-09 MED ORDER — ALBUMIN HUMAN 5 % IV SOLN: 250.0000 mL | INTRAVENOUS | Status: DC | PRN

## 2023-10-09 MED ORDER — ONDANSETRON HCL 4 MG/2ML IJ SOLN
4.0000 mg | Freq: Four times a day (QID) | INTRAMUSCULAR | Status: DC | PRN
  Administered 2023-10-10 – 2023-10-11 (×3): 4 mg via INTRAVENOUS
  Filled 2023-10-09 (×3): qty 2

## 2023-10-09 MED ORDER — PANTOPRAZOLE SODIUM 40 MG PO TBEC
40.0000 mg | DELAYED_RELEASE_TABLET | Freq: Every day | ORAL | Status: DC
  Administered 2023-10-11 – 2023-10-13 (×3): 40 mg via ORAL
  Filled 2023-10-09 (×3): qty 1

## 2023-10-09 MED ORDER — FLUTICASONE FUROATE-VILANTEROL 200-25 MCG/ACT IN AEPB
1.0000 | INHALATION_SPRAY | Freq: Every day | RESPIRATORY_TRACT | Status: DC
  Administered 2023-10-10 – 2023-10-11 (×2): 1 via RESPIRATORY_TRACT
  Filled 2023-10-09 (×2): qty 28

## 2023-10-09 MED ORDER — GLYCOPYRROLATE 0.2 MG/ML IJ SOLN
INTRAMUSCULAR | Status: DC | PRN
  Administered 2023-10-09: .2 mg via INTRAVENOUS

## 2023-10-09 MED ORDER — BISACODYL 5 MG PO TBEC
10.0000 mg | DELAYED_RELEASE_TABLET | Freq: Every day | ORAL | Status: DC
  Administered 2023-10-10 – 2023-10-12 (×3): 10 mg via ORAL
  Filled 2023-10-09 (×3): qty 2

## 2023-10-09 MED ORDER — FENTANYL CITRATE (PF) 250 MCG/5ML IJ SOLN
INTRAMUSCULAR | Status: AC
Start: 2023-10-09 — End: 2023-10-09
  Filled 2023-10-09: qty 5

## 2023-10-09 MED ORDER — TRAMADOL HCL 50 MG PO TABS
50.0000 mg | ORAL_TABLET | ORAL | Status: DC | PRN
  Administered 2023-10-09 – 2023-10-11 (×6): 100 mg via ORAL
  Filled 2023-10-09 (×6): qty 2

## 2023-10-09 MED ORDER — MIDAZOLAM HCL (PF) 10 MG/2ML IJ SOLN
INTRAMUSCULAR | Status: AC
  Filled 2023-10-09: qty 2

## 2023-10-09 MED ORDER — SODIUM CHLORIDE 0.9% FLUSH: 3.0000 mL | Freq: Two times a day (BID) | INTRAVENOUS | Status: DC

## 2023-10-09 MED ORDER — SODIUM CHLORIDE 0.9% FLUSH: 3.0000 mL | INTRAVENOUS | Status: DC | PRN

## 2023-10-09 MED ORDER — FENTANYL CITRATE (PF) 250 MCG/5ML IJ SOLN
INTRAMUSCULAR | Status: DC | PRN
  Administered 2023-10-09 (×2): 100 ug via INTRAVENOUS
  Administered 2023-10-09: 250 ug via INTRAVENOUS
  Administered 2023-10-09: 150 ug via INTRAVENOUS
  Administered 2023-10-09: 400 ug via INTRAVENOUS
  Administered 2023-10-09: 100 ug via INTRAVENOUS
  Administered 2023-10-09: 50 ug via INTRAVENOUS

## 2023-10-09 MED ORDER — ATORVASTATIN CALCIUM 40 MG PO TABS
40.0000 mg | ORAL_TABLET | Freq: Every day | ORAL | Status: DC
  Administered 2023-10-09 – 2023-10-13 (×5): 40 mg via ORAL
  Filled 2023-10-09 (×5): qty 1

## 2023-10-09 MED ORDER — CHLORHEXIDINE GLUCONATE 4 % EX SOLN: 30.0000 mL | CUTANEOUS | Status: DC

## 2023-10-09 MED ORDER — DEXTROSE 50 % IV SOLN: 0.0000 mL | INTRAVENOUS | Status: DC | PRN

## 2023-10-09 MED ORDER — SODIUM CHLORIDE 0.9 % IV SOLN: 250.0000 mL | INTRAVENOUS | Status: DC

## 2023-10-09 MED ORDER — CHLORHEXIDINE GLUCONATE 0.12 % MT SOLN
OROMUCOSAL | Status: AC
  Administered 2023-10-09: 15 mL via OROMUCOSAL
  Filled 2023-10-09: qty 15

## 2023-10-09 MED ORDER — ASPIRIN 81 MG PO CHEW
324.0000 mg | CHEWABLE_TABLET | Freq: Once | ORAL | Status: AC
  Administered 2023-10-09: 324 mg via ORAL
  Filled 2023-10-09: qty 4

## 2023-10-09 MED ORDER — PHENYLEPHRINE HCL-NACL 20-0.9 MG/250ML-% IV SOLN: 0.0000 ug/min | INTRAVENOUS | Status: DC

## 2023-10-09 MED ORDER — ACETAMINOPHEN 160 MG/5ML PO SOLN
650.0000 mg | Freq: Once | ORAL | Status: AC
  Administered 2023-10-09: 650 mg
  Filled 2023-10-09: qty 20.3

## 2023-10-09 MED ORDER — INSULIN REGULAR(HUMAN) IN NACL 100-0.9 UT/100ML-% IV SOLN: INTRAVENOUS | Status: DC

## 2023-10-09 MED ORDER — ACETAMINOPHEN 500 MG PO TABS
1000.0000 mg | ORAL_TABLET | Freq: Four times a day (QID) | ORAL | Status: DC
  Administered 2023-10-10 – 2023-10-13 (×14): 1000 mg via ORAL
  Filled 2023-10-09 (×14): qty 2

## 2023-10-09 MED ORDER — MIDAZOLAM HCL 2 MG/2ML IJ SOLN
2.0000 mg | INTRAMUSCULAR | Status: DC | PRN
Start: 2023-10-09 — End: 2023-10-10

## 2023-10-09 MED ORDER — PROPOFOL 10 MG/ML IV BOLUS
INTRAVENOUS | Status: DC | PRN
  Administered 2023-10-09: 50 mg via INTRAVENOUS
  Administered 2023-10-09: 100 mg via INTRAVENOUS
  Administered 2023-10-09: 20 mg via INTRAVENOUS

## 2023-10-09 MED ORDER — ASPIRIN 325 MG PO TBEC
325.0000 mg | DELAYED_RELEASE_TABLET | Freq: Every day | ORAL | Status: DC
  Administered 2023-10-10 – 2023-10-13 (×4): 325 mg via ORAL
  Filled 2023-10-09 (×4): qty 1

## 2023-10-09 MED ORDER — CHLORHEXIDINE GLUCONATE 0.12 % MT SOLN
15.0000 mL | OROMUCOSAL | Status: AC
  Administered 2023-10-09: 15 mL via OROMUCOSAL
  Filled 2023-10-09: qty 15

## 2023-10-09 MED ORDER — PANTOPRAZOLE SODIUM 40 MG IV SOLR
40.0000 mg | Freq: Every day | INTRAVENOUS | Status: AC
  Administered 2023-10-09 – 2023-10-10 (×2): 40 mg via INTRAVENOUS
  Filled 2023-10-09 (×2): qty 10

## 2023-10-09 MED ORDER — CEFAZOLIN SODIUM-DEXTROSE 2-4 GM/100ML-% IV SOLN
2.0000 g | Freq: Three times a day (TID) | INTRAVENOUS | Status: AC
  Administered 2023-10-09 – 2023-10-11 (×6): 2 g via INTRAVENOUS
  Filled 2023-10-09 (×6): qty 100

## 2023-10-09 MED ORDER — PHENYLEPHRINE 80 MCG/ML (10ML) SYRINGE FOR IV PUSH (FOR BLOOD PRESSURE SUPPORT)
PREFILLED_SYRINGE | INTRAVENOUS | Status: DC | PRN
  Administered 2023-10-09 (×2): 40 ug via INTRAVENOUS
  Administered 2023-10-09: 160 ug via INTRAVENOUS
  Administered 2023-10-09 (×2): 40 ug via INTRAVENOUS

## 2023-10-09 SURGICAL SUPPLY — 72 items
ADAPTER MULTI PERFUSION 15 (ADAPTER) ×2 IMPLANT
BAG DECANTER FOR FLEXI CONT (MISCELLANEOUS) ×2 IMPLANT
BLADE CLIPPER SURG (BLADE) ×2 IMPLANT
BLADE STERNUM SYSTEM 6 (BLADE) ×2 IMPLANT
BNDG ELASTIC 4INX 5YD STR LF (GAUZE/BANDAGES/DRESSINGS) IMPLANT
BNDG ELASTIC 4X5.8 VLCR STR LF (GAUZE/BANDAGES/DRESSINGS) ×2 IMPLANT
BNDG ELASTIC 6INX 5YD STR LF (GAUZE/BANDAGES/DRESSINGS) ×2 IMPLANT
BNDG GAUZE DERMACEA FLUFF 4 (GAUZE/BANDAGES/DRESSINGS) ×2 IMPLANT
CANISTER SUCTION 3000ML PPV (SUCTIONS) ×2 IMPLANT
CANNULA AORTIC ROOT 9FR (CANNULA) ×2 IMPLANT
CANNULA MC2 2 STG 29/37 NON-V (CANNULA) ×2 IMPLANT
CANNULA NON VENT 20FR 12 (CANNULA) ×2 IMPLANT
CATH ROBINSON RED A/P 18FR (CATHETERS) ×4 IMPLANT
CLIP RETRACTION 3.0MM CORONARY (MISCELLANEOUS) IMPLANT
CLIP TI MEDIUM 24 (CLIP) IMPLANT
CLIP TI WIDE RED SMALL 24 (CLIP) IMPLANT
CONNECTOR BLAKE 2:1 CARIO BLK (MISCELLANEOUS) ×2 IMPLANT
CONTAINER PROTECT SURGISLUSH (MISCELLANEOUS) ×4 IMPLANT
DERMABOND ADVANCED .7 DNX12 (GAUZE/BANDAGES/DRESSINGS) IMPLANT
DRAIN CHANNEL 19F RND (DRAIN) ×6 IMPLANT
DRAIN CONNECTOR BLAKE 1:1 (MISCELLANEOUS) IMPLANT
DRAPE INCISE IOBAN 66X45 STRL (DRAPES) IMPLANT
DRAPE SRG 135X102X78XABS (DRAPES) ×2 IMPLANT
DRAPE WARM FLUID 44X44 (DRAPES) ×2 IMPLANT
DRSG AQUACEL AG ADV 3.5X10 (GAUZE/BANDAGES/DRESSINGS) ×2 IMPLANT
ELECTRODE BLDE 4.0 EZ CLN MEGD (MISCELLANEOUS) ×2 IMPLANT
ELECTRODE REM PT RTRN 9FT ADLT (ELECTROSURGICAL) ×4 IMPLANT
FELT TEFLON 1X6 (MISCELLANEOUS) ×4 IMPLANT
GAUZE SPONGE 4X4 12PLY STRL (GAUZE/BANDAGES/DRESSINGS) ×4 IMPLANT
GLOVE BIO SURGEON STRL SZ7 (GLOVE) ×4 IMPLANT
GLOVE BIOGEL M STRL SZ7.5 (GLOVE) ×4 IMPLANT
GOWN STRL REUS W/ TWL LRG LVL3 (GOWN DISPOSABLE) ×8 IMPLANT
GOWN STRL REUS W/ TWL XL LVL3 (GOWN DISPOSABLE) ×4 IMPLANT
HEMOSTAT POWDER SURGIFOAM 1G (HEMOSTASIS) ×4 IMPLANT
INSERT SUTURE HOLDER (MISCELLANEOUS) ×2 IMPLANT
KIT BASIN OR (CUSTOM PROCEDURE TRAY) ×2 IMPLANT
KIT TURNOVER KIT B (KITS) ×2 IMPLANT
KIT VASOVIEW HEMOPRO 2 VH 4000 (KITS) ×2 IMPLANT
LEAD PACING MYOCARDI (MISCELLANEOUS) ×2 IMPLANT
MARKER DISTAL GRAFT W/ HOLDER (MISCELLANEOUS) ×6 IMPLANT
NS IRRIG 1000ML POUR BTL (IV SOLUTION) ×10 IMPLANT
PACK E OPEN HEART (SUTURE) ×2 IMPLANT
PACK OPEN HEART (CUSTOM PROCEDURE TRAY) ×2 IMPLANT
PAD ARMBOARD POSITIONER FOAM (MISCELLANEOUS) ×4 IMPLANT
PAD ELECT DEFIB RADIOL ZOLL (MISCELLANEOUS) ×2 IMPLANT
PENCIL BUTTON HOLSTER BLD 10FT (ELECTRODE) ×2 IMPLANT
POSITIONER HEAD DONUT 9IN (MISCELLANEOUS) ×2 IMPLANT
PUNCH AORTIC ROTATE 4.0MM (MISCELLANEOUS) ×2 IMPLANT
SET MPS 3-ND DEL (MISCELLANEOUS) IMPLANT
SOLUTION ANTFG W/FOAM PAD STRL (MISCELLANEOUS) IMPLANT
STOPCOCK 4 WAY LG BORE MALE ST (IV SETS) IMPLANT
SUPPORT HEART JANKE-BARRON (MISCELLANEOUS) ×2 IMPLANT
SUT ETHIBOND X763 2 0 SH 1 (SUTURE) ×4 IMPLANT
SUT MNCRL AB 3-0 PS2 18 (SUTURE) ×4 IMPLANT
SUT MNCRL AB 4-0 PS2 18 (SUTURE) IMPLANT
SUT PDS AB 1 CTX 36 (SUTURE) ×4 IMPLANT
SUT PROLENE 4 0 SH DA (SUTURE) ×2 IMPLANT
SUT PROLENE 5 0 C 1 36 (SUTURE) ×6 IMPLANT
SUT PROLENE 7 0 BV1 MDA (SUTURE) ×2 IMPLANT
SUT STEEL 6MS V (SUTURE) ×4 IMPLANT
SUT VIC AB 2-0 CT1 TAPERPNT 27 (SUTURE) IMPLANT
SYSTEM SAHARA CHEST DRAIN ATS (WOUND CARE) ×2 IMPLANT
TAPE CLOTH SURG 4X10 WHT LF (GAUZE/BANDAGES/DRESSINGS) IMPLANT
TAPE PAPER 2X10 WHT MICROPORE (GAUZE/BANDAGES/DRESSINGS) IMPLANT
TOWEL GREEN STERILE (TOWEL DISPOSABLE) ×2 IMPLANT
TOWEL GREEN STERILE FF (TOWEL DISPOSABLE) ×2 IMPLANT
TRAY FOLEY SLVR 16FR TEMP STAT (SET/KITS/TRAYS/PACK) ×2 IMPLANT
TUBE SUCT INTRACARD DLP 20F (MISCELLANEOUS) ×2 IMPLANT
TUBE SUCTION CARDIAC 10FR (CANNULA) ×2 IMPLANT
TUBING LAP HI FLOW INSUFFLATIO (TUBING) ×2 IMPLANT
UNDERPAD 30X36 HEAVY ABSORB (UNDERPADS AND DIAPERS) ×2 IMPLANT
WATER STERILE IRR 1000ML POUR (IV SOLUTION) ×4 IMPLANT

## 2023-10-09 NOTE — Progress Notes (Signed)
 K 2.8 per istat. Pt on inuslin gtt from OR.  Endotool prompting this RN to let provider know about K. Currently going to follow protocol for replacing K with 3 runs. Repeat BMET at 1830 per protocol.   CCM NP aware.

## 2023-10-09 NOTE — Interval H&P Note (Signed)
 History and Physical Interval Note:  10/09/2023 7:39 AM  Christopher Oconnell  has presented today for surgery, with the diagnosis of CAD.  The various methods of treatment have been discussed with the patient and family. After consideration of risks, benefits and other options for treatment, the patient has consented to  Procedure(s): CORONARY ARTERY BYPASS GRAFTING (CABG) (N/A) ECHOCARDIOGRAM, TRANSESOPHAGEAL (N/A) as a surgical intervention.  The patient's history has been reviewed, patient examined, no change in status, stable for surgery.  I have reviewed the patient's chart and labs.  Questions were answered to the patient's satisfaction.     Jeffry Vogelsang MALVA Rayas

## 2023-10-09 NOTE — Anesthesia Procedure Notes (Addendum)
 Procedure Name: Intubation Date/Time: 10/09/2023 8:03 AM  Performed by: Vertie Arthea RAMAN, CRNAPre-anesthesia Checklist: Patient identified, Emergency Drugs available, Suction available, Patient being monitored and Timeout performed Patient Re-evaluated:Patient Re-evaluated prior to induction Oxygen Delivery Method: Circle system utilized Preoxygenation: Pre-oxygenation with 100% oxygen Induction Type: IV induction Ventilation: Two handed mask ventilation required and Oral airway inserted - appropriate to patient size Laryngoscope Size: 4 and Glidescope Grade View: Grade I Tube type: Oral Tube size: 8.0 mm Number of attempts: 1 Airway Equipment and Method: Rigid stylet Placement Confirmation: ETT inserted through vocal cords under direct vision, breath sounds checked- equal and bilateral and positive ETCO2 Secured at: 24 cm Tube secured with: Tape Dental Injury: Teeth and Oropharynx as per pre-operative assessment  Comments: Performed by Wanda Prader, SRNA under supervision of CRNA and MD.

## 2023-10-09 NOTE — Plan of Care (Signed)

## 2023-10-09 NOTE — Brief Op Note (Signed)
 10/09/2023  11:16 AM  PATIENT:  Christopher Oconnell  73 y.o. male  PRE-OPERATIVE DIAGNOSIS:  Coronary Artery Disease  POST-OPERATIVE DIAGNOSIS:  Coronary Artery Disease  PROCEDURE:   CORONARY ARTERY BYPASS GRAFTING (CABG) TIMES TWO USING LEFT INTERNAL MAMMARY ARTERY AND ENDOSCOPICALLY HARVESTED RIGHT GREATER SAPHENOUS VEIN   Vein harvest time: Vein prep time:  ECHOCARDIOGRAM, TRANSESOPHAGEAL   SURGEON:  Shyrl Linnie KIDD, MD   PHYSICIAN ASSISTANTS: Raguel / Atarah Cadogan  ASSISTANTS: Rudy Zebedee JAYSON, RN, Scrub Person         Lawernce Mariel RAMAN, RN, RN First Assistant   ANESTHESIA:   general  EBL:   BLOOD ADMINISTERED:none  DRAINS: Mediastinal and left pleural drains   LOCAL MEDICATIONS USED:  NONE  COUNTS:  YES  DICTATION: .Dragon Dictation  PLAN OF CARE: Admit to inpatient   PATIENT DISPOSITION:  ICU - intubated and hemodynamically stable.   Delay start of Pharmacological VTE agent (>24hrs) due to surgical blood loss or risk of bleeding: yes

## 2023-10-09 NOTE — Progress Notes (Signed)
 Patient ID: Christopher Oconnell, male   DOB: 1950/06/20, 73 y.o.   MRN: [id number]   TCTS Evening Rounds:   Hemodynamically stable  CI = 4.6  Extubated  Urine output good  CT output low  CBC    Component Value Date/Time   WBC 17.6 (H) 10/09/2023 1806   RBC 3.79 (L) 10/09/2023 1806   HGB 12.4 (L) 10/09/2023 1806   HGB 15.2 08/22/2023 0928   HCT 36.2 (L) 10/09/2023 1806   HCT 46.6 08/22/2023 0928   PLT 193 10/09/2023 1806   PLT 302 08/22/2023 0928   MCV 95.5 10/09/2023 1806   MCV 100 (H) 08/22/2023 0928   MCH 32.7 10/09/2023 1806   MCHC 34.3 10/09/2023 1806   RDW 12.1 10/09/2023 1806   RDW 11.9 08/22/2023 0928   LYMPHSABS 1.9 04/02/2022 1005   MONOABS 0.9 05/10/2021 0911   EOSABS 0.2 04/02/2022 1005   BASOSABS 0.1 04/02/2022 1005     BMET    Component Value Date/Time   NA 140 10/09/2023 1806   NA 139 08/22/2023 0928   K 3.8 10/09/2023 1806   CL 106 10/09/2023 1806   CO2 24 10/09/2023 1806   GLUCOSE 121 (H) 10/09/2023 1806   BUN 9 10/09/2023 1806   BUN 12 08/22/2023 0928   CREATININE 0.86 10/09/2023 1806   CALCIUM  7.8 (L) 10/09/2023 1806   EGFR 74 08/22/2023 0928   GFRNONAA >60 10/09/2023 1806     A/P:  Stable postop course. Continue current plans.

## 2023-10-09 NOTE — Anesthesia Procedure Notes (Signed)
 Arterial Line Insertion Start/End7/30/2025 7:21 AM, 10/09/2023 7:21 AM Performed by: CRNA  Patient location: Pre-op. Preanesthetic checklist: patient identified, IV checked, site marked, risks and benefits discussed, surgical consent, monitors and equipment checked, pre-op evaluation, timeout performed and anesthesia consent Lidocaine  1% used for infiltration Left, radial was placed Catheter size: 20 G Hand hygiene performed  and maximum sterile barriers used   Attempts: 2 Procedure performed without using ultrasound guided technique. Following insertion, Biopatch and dressing applied. Post procedure assessment: normal  Patient tolerated the procedure well with no immediate complications.

## 2023-10-09 NOTE — Discharge Instructions (Signed)

## 2023-10-09 NOTE — Op Note (Signed)
 [address]       [name] [address]             [phone]                                          10/09/2023 Patient:  Elspeth JAYSON Messing Pre-Op Dx: LILLA CAD HTN DM   Stable angina Post-op Dx:  same Procedure: CABG X 2.  LIMA OM, RSVG PDA   Endoscopic greater saphenous vein harvest on the right   Surgeon and Role:      * Flo Berroa, Linnie KIDD, MD - Primary    * EMERSON Becket , PA-C - assisting An experienced assistant was required given the complexity of this surgery and the standard of surgical care. The assistant was needed for exposure, dissection, suctioning, retraction of delicate tissues and sutures, instrument exchange and for overall help during this procedure.    Anesthesia  general EBL:  Blood Administration: none Xclamp Time:  42 min Pump Time:   Drains: 98 F blake drain: L, mediastinal  Wires: V Counts: correct   Indications: 73 y.o. male with symptomatic two-vessel coronary artery disease.  He does not have any notable obstructions in his LAD.  He does have disease in his RCA, and the PDA appears somewhat small.  His echocardiogram shows preserved biventricular function and no significant valvular disease.  We discussed the risks and benefits of a coronary artery bypass grafting with a LIMA to obtuse marginal, and vein graft to PDA.  He is agreeable to proceed.   Findings: Good vein.  Small LIMA, small OM, and calcified PDA  Operative Technique: All invasive lines were placed in pre-op holding.  After the risks, benefits and alternatives were thoroughly discussed, the patient was brought to the operative theatre.  Anesthesia was induced, and the patient was prepped and draped in normal sterile fashion.  An appropriate surgical pause was performed, and pre-operative antibiotics were dosed accordingly.  We began with simultaneous incisions along the right leg for harvesting of the greater saphenous vein and the chest for  the sternotomy.  In regards to the sternotomy, this was carried down with bovie cautery, and the sternum was divided with a reciprocating saw.  Meticulous hemostasis was obtained.  The left internal thoracic artery was exposed and harvested in in pedicled fashion.  The patient was systemically heparinized, and the artery was divided distally, and placed in a papaverine sponge.    The sternal elevator was removed, and a retractor was placed.  The pericardium was divided in the midline and fashioned into a cradle with pericardial stitches.   After we confirmed an appropriate ACT, the ascending aorta was cannulated in standard fashion.  The right atrial appendage was used for venous cannulation site.  Cardiopulmonary bypass was initiated, and the heart retractor was placed. The cross clamp was applied, and a dose of anterograde cardioplegia was given with good arrest of the heart.  We moved to the posterior wall of the heart, and found a good target on the PDA.  An arteriotomy was made, and the vein graft was anastomosed to it in an end to side fashion.  Next we exposed the lateral wall, and found a good target on the OM.  An end to side anastomosis with the LIMA graft was then created.  We began to re-warm, and  a re-animation dose of cardioplegia was given.  The heart was de-aired, and the cross clamp was removed.  Meticulous hemostasis was obtained.    A partial occludding clamp was then placed on the ascending aorta, and we created an end to side anastomosis between it and the proximal vein graft.  Rings were placed on the proximal anastomosis.  Hemostasis was obtained, and we separated from cardiopulmonary bypass without event.  The heparin  was reversed with protamine .  Chest tubes and wires were placed, and the sternum was re-approximated with sternal wires.  The soft tissue and skin were re-approximated wth absorbable suture.    The patient tolerated the procedure without any immediate complications, and  was transferred to the ICU in guarded condition.  Sheffield Hawker MALVA Rayas

## 2023-10-09 NOTE — Anesthesia Procedure Notes (Signed)
 Central Venous Catheter Insertion Performed by: Maryclare Cornet, MD, anesthesiologist Start/End7/30/2025 7:01 AM, 10/09/2023 7:12 AM Patient location: Pre-op. Preanesthetic checklist: patient identified, IV checked, site marked, risks and benefits discussed, surgical consent, monitors and equipment checked, pre-op evaluation, timeout performed and anesthesia consent Lidocaine  1% used for infiltration and patient sedated Hand hygiene performed  and maximum sterile barriers used  Catheter size: 9 Fr Central line was placed.MAC introducer Procedure performed using ultrasound guided technique. Ultrasound Notes:anatomy identified, needle tip was noted to be adjacent to the nerve/plexus identified, no ultrasound evidence of intravascular and/or intraneural injection and image(s) printed for medical record Attempts: 1 Following insertion, line sutured and dressing applied. Post procedure assessment: blood return through all ports, free fluid flow and no air  Patient tolerated the procedure well with no immediate complications.

## 2023-10-09 NOTE — Transfer of Care (Signed)
 Immediate Anesthesia Transfer of Care Note  Patient: Christopher Oconnell  Procedure(s) Performed: CORONARY ARTERY BYPASS GRAFTING (CABG) TIMES TWO USING LEFT INTERNAL MAMMARY ARTERY AND ENDOSCOPICALLY HARVESTED RIGHT GREATER SAPHENOUS VEIN (Chest) ECHOCARDIOGRAM, TRANSESOPHAGEAL  Patient Location: ICU  Anesthesia Type:General  Level of Consciousness: sedated  Airway & Oxygen Therapy: Patient remains intubated per anesthesia plan and Patient placed on Ventilator (see vital sign flow sheet for setting)  Post-op Assessment: Report given to RN and Post -op Vital signs reviewed and stable  Post vital signs: Reviewed and stable  Last Vitals:  Vitals Value Taken Time  BP    Temp 36.6 C 10/09/23 12:35  Pulse 75 10/09/23 12:35  Resp 16 10/09/23 12:35  SpO2 92 % 10/09/23 12:35  Vitals shown include unfiled device data.  Last Pain:  Vitals:   10/09/23 0606  TempSrc:   PainSc: 0-No pain         Complications: There were no known notable events for this encounter.

## 2023-10-09 NOTE — Anesthesia Preprocedure Evaluation (Signed)
 Anesthesia Evaluation  Patient identified by MRN, date of birth, ID band Patient awake    Reviewed: Allergy & Precautions, H&P , NPO status , Patient's Chart, lab work & pertinent test results  Airway Mallampati: II   Neck ROM: full    Dental   Pulmonary asthma , former smoker   breath sounds clear to auscultation       Cardiovascular hypertension, + angina  + CAD   Rhythm:regular Rate:Normal  TTE (08/26/23): EF 60%, mild MR   Neuro/Psych    GI/Hepatic ,GERD  ,,  Endo/Other  diabetes, Type 2    Renal/GU      Musculoskeletal  (+) Arthritis ,    Abdominal   Peds  Hematology   Anesthesia Other Findings   Reproductive/Obstetrics                              Anesthesia Physical Anesthesia Plan  ASA: 3  Anesthesia Plan: General   Post-op Pain Management:    Induction: Intravenous  PONV Risk Score and Plan: 2 and Ondansetron , Dexamethasone, Treatment may vary due to age or medical condition and Midazolam   Airway Management Planned: Oral ETT  Additional Equipment: Arterial line, CVP, TEE and Ultrasound Guidance Line Placement  Intra-op Plan:   Post-operative Plan: Post-operative intubation/ventilation  Informed Consent: I have reviewed the patients History and Physical, chart, labs and discussed the procedure including the risks, benefits and alternatives for the proposed anesthesia with the patient or authorized representative who has indicated his/her understanding and acceptance.     Dental advisory given  Plan Discussed with: CRNA, Anesthesiologist and Surgeon  Anesthesia Plan Comments:         Anesthesia Quick Evaluation

## 2023-10-09 NOTE — Procedures (Signed)
 Extubation Procedure Note  Patient Details:   Name: Christopher Oconnell DOB: 02/10/1951 MRN: [id number]   Airway Documentation:    Vent end date: 10/09/23 Vent end time: 1500   Evaluation  O2 sats: stable throughout Complications: No apparent complications Patient did tolerate procedure well. Bilateral Breath Sounds: Clear   Yesj, pt could speak post extubation  Pt extubated to 4 l/m per protocol.  Christopher Oconnell 10/09/2023, 4:02 PM

## 2023-10-09 NOTE — Hospital Course (Addendum)
 History of Present Illness:  DADEN MAHANY is a 73 y.o. male with history of coronary artery disease status post angioplasty to D1 in 1993, stroke, hypertension, hyperlipidemia, type 2 diabetes mellitus, and interstitial lung disease, who presents for follow-up of coronary artery disease.  I last saw him in 12/2018 for, at which time he was feeling better after adjustment of his pulmonary treatments for his ILD.  He noted infrequent chest discomfort when overdoing it.  We discussed pursuing ischemia testing though [name] ultimately declined, wishing to continue his lung therapy to see if his symptoms would continue to improve. He presented to follow up with Dr. Mady at which time he continued to shortness of breath with exertion and associated tightness in the chest that has progressed since his last visit.  Overall his respiratory inhalers were not helping.  PET/CT was recommended which showed evidence of CAD with reduction in EF.  Ultimately he required coronary catheterization.  This was performed on 6/30 and showed severe CAD.  The plan was to discuss imaging at the heart team meeting which led to referral to the Cardiothoracic surgical team.  He was evaluated by Dr. Shyrl who recommended coronary bypass grafting.  The risks and benefits of the procedure were explained to the patient and he was agreeable to proceed.  Hospital Course:  Mr. Borcherding presented to [facility] on 10/09/2023.  He was taken to the operating room and underwent CABG x 2 with the left internal mammary artery grafted to the obtuse marginal coronary artery and a saphenous vein graft placed to the posterior descending coronary artery. Following the procedure he was transferred to the surgical ICU in stable condition. He was extubated the evening of surgery without complication. Drips were weaned as hemodynamics tolerated, swan ganz catheter and arterial line were removed. Epicardial pacing wires were removed  without complication. He was routinely diuresed. Chest tubes were left in place for moderate drainage. He had nausea, Zofran  and Reglan  was continued. He developed urinary retention requiring 2 in and out catheterizations, Flomax  was started.  Voiding is improved significantly since this was initiated.  He was felt stable for transfer to the progressive unit.  He has continued to make good progress.  Incisions are noted to be healing well without evidence of infection.  He is tolerating diet.  He has had a bowel movement.  Oxygen has been weaned and he maintains good saturations on room air.  Diuresis will continue for a short-term at home as weight remains approximately 2 kg greater than preop.  He is maintaining normal renal function.  He did have a reactive leukocytosis which has resolved.  He has a very mild expected acute blood loss anemia which is stable and does not require further treatment.  He is maintaining normal sinus rhythm.  He has had some hypertension and beta-blocker as well as home ACE inhibitor have been reinitiated.  Overall, at the time of discharge the patient is felt to be quite stable.

## 2023-10-09 NOTE — Consult Note (Signed)
 NAME:  Christopher Oconnell, MRN:  [id number], DOB:  [birth year], LOS: 0 ADMISSION DATE:  10/09/2023, CONSULTATION DATE:  7/30 REFERRING MD:  Shyrl MA CHIEF COMPLAINT:  s/p CABG   History of Present Illness:  73 year old male with past medical history of arthritis, diabetes, GERD, hypertension, hyperlipidemia, interstitial lung disease, CAD who presented to Dr. Shyrl outpatient for two-vessel coronary artery disease.  He had echo on 6/16 with LVEF 60 to 65%, G1 DD, mild MR and TR.  6/23 left heart cath showing 99% mid RCA stenosis and 90% ostial LCx stenosis.  7/30 CABG Pump time: 46m  X clamp time: 85m EBL: 500  Pertinent  Medical History  Arthritis, diabetes, GERD, hypertension, hyperlipidemia, interstitial lung disease, CAD  Significant Hospital Events: Including procedures, antibiotic start and stop dates in addition to other pertinent events   7/30: CABG x 2 with Dr. Shyrl  Interim History / Subjective:  Admit to ICU postoperative  Objective   Blood pressure 135/74, pulse 69, temperature 97.7 F (36.5 C), temperature source Oral, resp. rate 18, height 5' 8 (1.727 m), weight 89.8 kg, SpO2 94%.        Intake/Output Summary (Last 24 hours) at 10/09/2023 0916 Last data filed at 10/09/2023 0906 Gross per 24 hour  Intake 1100 ml  Output --  Net 1100 ml   Filed Weights   10/09/23 0559  Weight: 89.8 kg    Examination: General: older male, laying in bed, vented HENT: perrla, ncat, anicteric sclera, ett, ogt  Lungs: ctab, vented, synchronous  Cardiovascular: s1s2, no murmur, paced, mediastinal drains Abdomen: rounded, soft  Extremities: post-op wrap to right leg, warm, no edema Neuro: sedated GU: foley  Resolved Hospital Problem list     Assessment & Plan:  Multivessel CAD s/p CABG x 2 LIMA to OM, RSVG to PDA Hypertension Hyperlipidemia - post-op management per TCTS - CXR/ ABG, CBC, BMET now - con't weaning pressors as able to maintain MAP >70-90.  Albumin  boluses prn for low SV.  - tele monitoring/ pacing prn - mediastinal drains per TCTS - multimodal pain control per protocol- oxycodone , tramadol , morphine  with bowel regimen - ASA, statin to resume next day  - metoprolol  BID once off pressors - complete post-op antibiotics - monitor electrolytes, replete PRN   Postoperative vent management Interstitial lung disease, NSIP Sees Dr. Aleskerov at [facility]. Last seen 04/2023. On pirfenidone 267mg  TID.  - rapid wean per TCTS protocol - full mechanical vent support - lung protective ventilation 6-8cc/kg Vt - VAP and PAD bundle in place  - titrate FiO2 to sat goal >92  - maintain peak/plats <30, driving pressures <84    Expected post-operative ABLA Expected post-operative consumptive thrombocytopenia  - trend - transfuse prn  Diabetes - on insulin  gtt post-op  - per endotool, wean to ssi  - cbg per endotool   GERD - ppi   Best Practice (right click and Reselect all SmartList Selections daily)   Diet/type: NPO DVT prophylaxis: SCD GI prophylaxis: PPI Lines: Central line, Arterial Line, and yes and it is still needed Foley:  Yes, and it is still needed Code Status:  full code Last date of multidisciplinary goals of care discussion [per primary]  Labs   CBC: Recent Labs  Lab 10/07/23 1500 10/09/23 0821 10/09/23 0825  WBC 9.7  --   --   HGB 14.5 10.5* 10.9*  HCT 43.1 31.0* 32.0*  MCV 95.8  --   --   PLT 294  --   --  Basic Metabolic Panel: Recent Labs  Lab 10/07/23 1500 10/09/23 0821 10/09/23 0825  NA 138 142 142  K 4.5 3.0* 2.9*  CL 102  --  105  CO2 28  --   --   GLUCOSE 133*  --  140*  BUN 11  --  9  CREATININE 1.04  --  0.70  CALCIUM  9.8  --   --    GFR: Estimated Creatinine Clearance: 88.2 mL/min (by C-G formula based on SCr of 0.7 mg/dL). Recent Labs  Lab 10/07/23 1500  WBC 9.7    Liver Function Tests: Recent Labs  Lab 10/07/23 1500  AST 17  ALT 16  ALKPHOS 70  BILITOT 0.8  PROT  6.8  ALBUMIN  4.2   No results for input(s): LIPASE, AMYLASE in the last 168 hours. No results for input(s): AMMONIA in the last 168 hours.  ABG    Component Value Date/Time   PHART 7.386 10/09/2023 0821   PCO2ART 35.6 10/09/2023 0821   PO2ART 210 (H) 10/09/2023 0821   HCO3 21.4 10/09/2023 0821   TCO2 21 (L) 10/09/2023 0825   ACIDBASEDEF 3.0 (H) 10/09/2023 0821   O2SAT 100 10/09/2023 0821     Coagulation Profile: Recent Labs  Lab 10/07/23 1500  INR 1.0    Cardiac Enzymes: No results for input(s): CKTOTAL, CKMB, CKMBINDEX, TROPONINI in the last 168 hours.  HbA1C: Hemoglobin A1C  Date/Time Value Ref Range Status  05/30/2023 08:51 AM 6.0 (A) 4.0 - 5.6 % Final  11/21/2022 09:09 AM 6.9 (A) 4.0 - 5.6 % Final  05/22/2018 12:00 AM 7.5  Final   Hgb A1c MFr Bld  Date/Time Value Ref Range Status  10/07/2023 02:31 PM 6.4 (H) 4.8 - 5.6 % Final    Comment:    (NOTE) Diagnosis of Diabetes The following HbA1c ranges recommended by the American Diabetes Association (ADA) may be used as an aid in the diagnosis of diabetes mellitus.  Hemoglobin             Suggested A1C NGSP%              Diagnosis  <5.7                   Non Diabetic  5.7-6.4                Pre-Diabetic  >6.4                   Diabetic  <7.0                   Glycemic control for                       adults with diabetes.    05/21/2022 03:13 PM 6.5 4.6 - 6.5 % Final    Comment:    Glycemic Control Guidelines for People with Diabetes:Non Diabetic:  <6%Goal of Therapy: <7%Additional Action Suggested:  >8%     CBG: Recent Labs  Lab 10/07/23 1416 10/09/23 0602  GLUCAP 161* 155*    Review of Systems:   As above  Past Medical History:  He,  has a past medical history of Acne, Arthritis, Asthma, Atypical mole (10/19/2020), Basal cell carcinoma (11/05/2019), Coronary artery disease, Diabetes mellitus without complication (HCC), Dysplastic nevus (11/08/2020), Dysplastic nevus  (09/17/2023), Dysplastic nevus (09/17/2023), GERD (gastroesophageal reflux disease), Hyperlipidemia, Hypertension, ILD (interstitial lung disease) (HCC), and Skin cancer.   Surgical History:   Past Surgical History:  Procedure  Laterality Date   COLONOSCOPY     CORONARY ANGIOPLASTY  1993   No stents   LEFT HEART CATH AND CORONARY ANGIOGRAPHY N/A 09/02/2023   Procedure: LEFT HEART CATH AND CORONARY ANGIOGRAPHY;  Surgeon: Mady Bruckner, MD;  Location: [facility];  Service: Cardiovascular;  Laterality: N/A;   NOSE SURGERY     broken nose and later re-set   SKIN CANCER EXCISION  01/2020   right ear    TONSILLECTOMY     as a child     Social History:   reports that he quit smoking about 32 years ago. His smoking use included cigarettes. He started smoking about 62 years ago. He has a 105 pack-year smoking history. He has never used smokeless tobacco. He reports current alcohol use. He reports that he does not use drugs.   Family History:  His family history includes Breast cancer in his sister and sister; Cancer in his maternal grandmother; Coronary artery disease in his father; Dementia in his mother; Drug abuse in his sister; Heart attack in his paternal grandfather and paternal grandmother; Melanoma in his father; Pancreatic cancer in his father.   Allergies No Known Allergies   Home Medications  Prior to Admission medications   Medication Sig Start Date End Date Taking? Authorizing Provider  aspirin  EC 81 MG tablet Take 81 mg by mouth daily.   Yes [provider]  atorvastatin  (LIPITOR) 40 MG tablet TAKE 1 TABLET BY MOUTH EVERY DAY 07/03/23  Yes Wendee Lynwood HERO, NP  enalapril  (VASOTEC ) 20 MG tablet TAKE 1 TABLET BY MOUTH EVERY DAY 04/05/23  Yes Wendee Lynwood HERO, NP  fluticasone -salmeterol (ADVAIR) 250-50 MCG/ACT AEPB Inhale 1 puff into the lungs in the morning and at bedtime. 05/07/23  Yes [provider]  isosorbide  mononitrate (IMDUR ) 30 MG 24 hr tablet Take  2 tablets (60 mg total) by mouth daily. Patient taking differently: Take 30 mg by mouth daily. 09/02/23  Yes End, Bruckner, MD  metFORMIN  (GLUCOPHAGE ) 500 MG tablet TAKE 1 TABLET BY MOUTH TWICE A DAY WITH FOOD 07/03/23  Yes Wendee Lynwood HERO, NP  nitroGLYCERIN  (NITROSTAT ) 0.4 MG SL tablet Place 1 tablet (0.4 mg total) under the tongue every 5 (five) minutes as needed for chest pain. 08/30/22 10/01/23 Yes End, Bruckner, MD  Omega-3 Fatty Acids (FISH OIL PO) Take 1 capsule by mouth daily.   Yes [provider]  pantoprazole  (PROTONIX ) 40 MG tablet Take 40 mg by mouth daily as needed (heart burn). 07/11/22  Yes [provider]  Potassium Gluconate 550 MG TABS Take 1 tablet by mouth 2 (two) times a week.   Yes [provider]  triamterene -hydrochlorothiazide (MAXZIDE-25) 37.5-25 MG tablet TAKE 1 TABLET BY MOUTH EVERY DAY IN THE MORNING 09/02/23  Yes Wendee Lynwood HERO, NP  Turmeric (QC TUMERIC COMPLEX PO) Take 1 capsule by mouth daily.   Yes [provider]  Lancets (FREESTYLE) lancets Use 1 lancet as directed fingerstick Once a day to test blood sugar for Dx: E11.9 04/14/14   [provider]     Critical care time: 38   Tinnie FORBES Adolph DEVONNA [facility] 10/09/23 12:50 PM  Please see [email or web address] for pager details.  From 7A-7P if no response, please call [phone] After hours, please call ELink [phone]

## 2023-10-10 ENCOUNTER — Encounter (HOSPITAL_COMMUNITY): Payer: Self-pay | Admitting: Thoracic Surgery (Cardiothoracic Vascular Surgery)

## 2023-10-10 ENCOUNTER — Inpatient Hospital Stay (HOSPITAL_COMMUNITY)

## 2023-10-10 DIAGNOSIS — J90 Pleural effusion, not elsewhere classified: Secondary | ICD-10-CM | POA: Diagnosis not present

## 2023-10-10 DIAGNOSIS — I10 Essential (primary) hypertension: Secondary | ICD-10-CM | POA: Diagnosis not present

## 2023-10-10 DIAGNOSIS — R918 Other nonspecific abnormal finding of lung field: Secondary | ICD-10-CM | POA: Diagnosis not present

## 2023-10-10 DIAGNOSIS — Z951 Presence of aortocoronary bypass graft: Secondary | ICD-10-CM | POA: Diagnosis not present

## 2023-10-10 DIAGNOSIS — Z48812 Encounter for surgical aftercare following surgery on the circulatory system: Secondary | ICD-10-CM | POA: Diagnosis not present

## 2023-10-10 DIAGNOSIS — E785 Hyperlipidemia, unspecified: Secondary | ICD-10-CM | POA: Diagnosis not present

## 2023-10-10 DIAGNOSIS — I25118 Atherosclerotic heart disease of native coronary artery with other forms of angina pectoris: Secondary | ICD-10-CM | POA: Diagnosis not present

## 2023-10-10 LAB — BASIC METABOLIC PANEL WITH GFR
Anion gap: 6 (ref 5–15)
Anion gap: 4 — ABNORMAL LOW (ref 5–15)
BUN: 8 mg/dL (ref 8–23)
BUN: 8 mg/dL (ref 8–23)
CO2: 23 mmol/L (ref 22–32)
CO2: 26 mmol/L (ref 22–32)
Calcium: 7.6 mg/dL — ABNORMAL LOW (ref 8.9–10.3)
Calcium: 7.7 mg/dL — ABNORMAL LOW (ref 8.9–10.3)
Chloride: 107 mmol/L (ref 98–111)
Chloride: 103 mmol/L (ref 98–111)
Creatinine, Ser: 0.85 mg/dL (ref 0.61–1.24)
Creatinine, Ser: 0.92 mg/dL (ref 0.61–1.24)
GFR, Estimated: >60 mL/min (ref >60)
GFR, Estimated: >60 mL/min (ref >60)
Glucose, Bld: 126 mg/dL — ABNORMAL HIGH (ref 70–99)
Glucose, Bld: 182 mg/dL — ABNORMAL HIGH (ref 70–99)
Potassium: 3.8 mmol/L (ref 3.5–5.1)
Potassium: 3.8 mmol/L (ref 3.5–5.1)
Sodium: 136 mmol/L (ref 135–145)
Sodium: 133 mmol/L — ABNORMAL LOW (ref 135–145)

## 2023-10-10 LAB — ECHO INTRAOPERATIVE TEE
Height: 68 in
Weight: 3168 [oz_av]

## 2023-10-10 LAB — MAGNESIUM
Magnesium: 2.4 mg/dL (ref 1.7–2.4)
Magnesium: 2 mg/dL (ref 1.7–2.4)

## 2023-10-10 LAB — CBC
HCT: 35.6 % — ABNORMAL LOW (ref 39.0–52.0)
HCT: 35.1 % — ABNORMAL LOW (ref 39.0–52.0)
Hemoglobin: 12.2 g/dL — ABNORMAL LOW (ref 13.0–17.0)
Hemoglobin: 11.8 g/dL — ABNORMAL LOW (ref 13.0–17.0)
MCH: 32.4 pg (ref 26.0–34.0)
MCH: 32.2 pg (ref 26.0–34.0)
MCHC: 34.3 g/dL (ref 30.0–36.0)
MCHC: 33.6 g/dL (ref 30.0–36.0)
MCV: 94.4 fL (ref 80.0–100.0)
MCV: 95.6 fL (ref 80.0–100.0)
Platelets: 197 K/uL (ref 150–400)
Platelets: 194 K/uL (ref 150–400)
RBC: 3.77 MIL/uL — ABNORMAL LOW (ref 4.22–5.81)
RBC: 3.67 MIL/uL — ABNORMAL LOW (ref 4.22–5.81)
RDW: 12.2 % (ref 11.5–15.5)
RDW: 12.3 % (ref 11.5–15.5)
WBC: 17.5 K/uL — ABNORMAL HIGH (ref 4.0–10.5)
WBC: 16.1 K/uL — ABNORMAL HIGH (ref 4.0–10.5)
nRBC: 0 % (ref 0.0–0.2)
nRBC: 0 % (ref 0.0–0.2)

## 2023-10-10 LAB — GLUCOSE, CAPILLARY
Glucose-Capillary: 117 mg/dL — ABNORMAL HIGH (ref 70–99)
Glucose-Capillary: 126 mg/dL — ABNORMAL HIGH (ref 70–99)
Glucose-Capillary: 132 mg/dL — ABNORMAL HIGH (ref 70–99)
Glucose-Capillary: 120 mg/dL — ABNORMAL HIGH (ref 70–99)
Glucose-Capillary: 131 mg/dL — ABNORMAL HIGH (ref 70–99)
Glucose-Capillary: 165 mg/dL — ABNORMAL HIGH (ref 70–99)
Glucose-Capillary: 180 mg/dL — ABNORMAL HIGH (ref 70–99)
Glucose-Capillary: 150 mg/dL — ABNORMAL HIGH (ref 70–99)

## 2023-10-10 MED ORDER — INSULIN GLARGINE-YFGN 100 UNIT/ML ~~LOC~~ SOLN
5.0000 [IU] | Freq: Two times a day (BID) | SUBCUTANEOUS | Status: DC
  Administered 2023-10-10 (×2): 5 [IU] via SUBCUTANEOUS
  Filled 2023-10-10 (×4): qty 0.05

## 2023-10-10 MED ORDER — INSULIN ASPART 100 UNIT/ML IJ SOLN: 0.0000 [IU] | INTRAMUSCULAR | Status: DC

## 2023-10-10 MED ORDER — FUROSEMIDE 40 MG PO TABS
40.0000 mg | ORAL_TABLET | Freq: Every day | ORAL | Status: DC
  Administered 2023-10-10: 40 mg via ORAL
  Filled 2023-10-10: qty 1

## 2023-10-10 MED ORDER — ENOXAPARIN SODIUM 30 MG/0.3ML IJ SOSY
30.0000 mg | PREFILLED_SYRINGE | Freq: Every day | INTRAMUSCULAR | Status: DC
  Administered 2023-10-10 – 2023-10-12 (×3): 30 mg via SUBCUTANEOUS
  Filled 2023-10-10 (×3): qty 0.3

## 2023-10-10 MED ORDER — INSULIN ASPART 100 UNIT/ML IJ SOLN
0.0000 [IU] | INTRAMUSCULAR | Status: DC
  Administered 2023-10-10: 4 [IU] via SUBCUTANEOUS
  Administered 2023-10-10: 2 [IU] via SUBCUTANEOUS
  Administered 2023-10-10: 4 [IU] via SUBCUTANEOUS
  Administered 2023-10-10: 7 [IU] via SUBCUTANEOUS
  Administered 2023-10-11: 2 [IU] via SUBCUTANEOUS
  Administered 2023-10-11: 4 [IU] via SUBCUTANEOUS
  Administered 2023-10-11: 2 [IU] via SUBCUTANEOUS

## 2023-10-10 MED ORDER — ENOXAPARIN SODIUM 40 MG/0.4ML IJ SOSY: 40.0000 mg | PREFILLED_SYRINGE | Freq: Every day | INTRAMUSCULAR | Status: DC

## 2023-10-10 MED ORDER — METOPROLOL TARTRATE 25 MG PO TABS
25.0000 mg | ORAL_TABLET | Freq: Two times a day (BID) | ORAL | Status: DC
  Administered 2023-10-10 – 2023-10-12 (×5): 25 mg via ORAL
  Filled 2023-10-10 (×5): qty 1

## 2023-10-10 MED FILL — Lidocaine HCl Local Preservative Free (PF) Inj 2%: INTRAMUSCULAR | Qty: 14 | Status: AC

## 2023-10-10 MED FILL — Heparin Sodium (Porcine) Inj 1000 Unit/ML: Qty: 1000 | Status: AC

## 2023-10-10 MED FILL — Potassium Chloride Inj 2 mEq/ML: INTRAVENOUS | Qty: 40 | Status: AC

## 2023-10-10 NOTE — Discharge Summary (Signed)
 [address] [place] [address]             [phone]        Physician Discharge Summary  Patient ID: Christopher Oconnell MRN: [id number] DOB/AGE: 1950/10/26 73 y.o.  Admit date: 10/09/2023 Discharge date: 10/13/2023  Admission Diagnoses:  Patient Active Problem List   Diagnosis Date Noted   Accelerating angina (HCC) 07/06/2023   Decreased renal function 06/27/2023   Lightheadedness 06/27/2023   Current use of proton pump inhibitor 05/30/2023   Open wound of left foot excluding one or more toes 11/21/2022   Interstitial lung disease (HCC) 08/30/2022   Preventative health care 05/21/2022   DOE (dyspnea on exertion) 03/21/2022   Wart of hand 05/10/2021   COVID-19 01/25/2021   Dysplastic nevus 11/08/2020   Hyperlipidemia associated with type 2 diabetes mellitus (HCC) 08/17/2020   Former smoker 05/19/2020   Arthritis of knee 07/30/2019   Family history of melanoma 07/30/2019   History of melanoma 07/30/2019   Coronary artery disease of native artery of native heart with stable angina pectoris (HCC) 02/11/2019   Hyperlipidemia LDL goal <70 02/11/2019   Essential hypertension 02/11/2019   Type 2 diabetes mellitus with neurological complications (HCC) 02/11/2019     Discharge Diagnoses:  Patient Active Problem List   Diagnosis Date Noted   S/P CABG x 2 10/11/2023   Accelerating angina (HCC) 07/06/2023   Decreased renal function 06/27/2023   Lightheadedness 06/27/2023   Current use of proton pump inhibitor 05/30/2023   Open wound of left foot excluding one or more toes 11/21/2022   Interstitial lung disease (HCC) 08/30/2022   Preventative health care 05/21/2022   DOE (dyspnea on exertion) 03/21/2022   Wart of hand 05/10/2021   COVID-19 01/25/2021   Dysplastic nevus 11/08/2020   Hyperlipidemia associated with type 2 diabetes mellitus (HCC) 08/17/2020   Former smoker 05/19/2020   Arthritis of knee 07/30/2019   Family history of  melanoma 07/30/2019   History of melanoma 07/30/2019   Coronary artery disease of native artery of native heart with stable angina pectoris (HCC) 02/11/2019   Hyperlipidemia LDL goal <70 02/11/2019   Essential hypertension 02/11/2019   Type 2 diabetes mellitus with neurological complications (HCC) 02/11/2019     Discharged Condition: good  History of Present Illness:  Christopher Oconnell is a 74 y.o. male with history of coronary artery disease status post angioplasty to D1 in 1993, stroke, hypertension, hyperlipidemia, type 2 diabetes mellitus, and interstitial lung disease, who presents for follow-up of coronary artery disease.  I last saw him in 12/2018 for, at which time he was feeling better after adjustment of his pulmonary treatments for his ILD.  He noted infrequent chest discomfort when overdoing it.  We discussed pursuing ischemia testing though [name] ultimately declined, wishing to continue his lung therapy to see if his symptoms would continue to improve. He presented to follow up with Dr. Mady at which time he continued to shortness of breath with exertion and associated tightness in the chest that has progressed since his last visit.  Overall his respiratory inhalers were not helping.  PET/CT was recommended which showed evidence of CAD with reduction in EF.  Ultimately he required coronary catheterization.  This was performed on 6/30 and showed severe CAD.  The plan was to discuss imaging at the heart team meeting which led to referral to the Cardiothoracic surgical team.  He was evaluated by  Dr. Shyrl who recommended coronary bypass grafting.  The risks and benefits of the procedure were explained to the patient and he was agreeable to proceed.  Hospital Course:  [name] presented to [facility] on 10/09/2023.  He was taken to the operating room and underwent CABG x 2 with the left internal mammary artery grafted to the obtuse marginal coronary artery and a  saphenous vein graft placed to the posterior descending coronary artery. Following the procedure he was transferred to the surgical ICU in stable condition. He was extubated the evening of surgery without complication. Drips were weaned as hemodynamics tolerated, swan ganz catheter and arterial line were removed. Epicardial pacing wires were removed without complication. He was routinely diuresed. Chest tubes were left in place for moderate drainage. He had nausea, Zofran  and Reglan  was continued. He developed urinary retention requiring 2 in and out catheterizations, Flomax  was started.  Voiding is improved significantly since this was initiated.  He was felt stable for transfer to the progressive unit.  He has continued to make good progress.  Incisions are noted to be healing well without evidence of infection.  He is tolerating diet.  He has had a bowel movement.  Oxygen has been weaned and he maintains good saturations on room air.  Diuresis will continue for a short-term at home as weight remains approximately 2 kg greater than preop.  He is maintaining normal renal function.  He did have a reactive leukocytosis which has resolved.  He has a very mild expected acute blood loss anemia which is stable and does not require further treatment.  He is maintaining normal sinus rhythm.  He has had some hypertension and beta-blocker as well as home ACE inhibitor have been reinitiated.  Overall, at the time of discharge the patient is felt to be quite stable.  Consults: pulmonary/intensive care  Significant Diagnostic Studies:  LEFT HEART CATH AND CORONARY ANGIOGRAPHY   Conclusions: Severe two-vessel coronary artery disease with 99% mid RCA and 90% ostial LCx stenoses.  There is also moderate diffuse LMCA and LAD disease with heavy calcification. Mildly elevated left ventricular filling pressure (LVEDP 20 mmHg).   ECHOCARDIOGRAM REPORT       Patient Name:   Christopher Oconnell Date of Exam: 08/26/2023   Medical Rec #:  [id number]           Height:       68.0 in  Accession #:    [id number]          Weight:       204.6 lb  Date of Birth:  1950/10/20           BSA:          2.064 m  Patient Age:    73 years            BP:           120/60 mmHg  Patient Gender: M                   HR:           71 bpm.  Exam Location:  [facility]   Procedure: 2D Echo, Cardiac Doppler and Color Doppler (Both Spectral and  Color            Flow Doppler were utilized during procedure).   Indications:    I20.89 Exertional angina; ; ; R06.02 SOB    History:        Patient has no prior  history of Echocardiogram  examinations.                 CAD, Signs/Symptoms:Dyspnea and Dizziness/Lightheadedness;  Risk                 Factors:Hypertension, Diabetes, Dyslipidemia and Former  Smoker.    Sonographer:   Alm Minerva  Referring Phys: [id number] RYAN M DUNN   IMPRESSIONS     1. Left ventricular ejection fraction, by estimation, is 60 to 65%. Left  ventricular ejection fraction by 2D MOD biplane is 60.5 %. The left  ventricle has normal function. The left ventricle has no regional wall  motion abnormalities. Left ventricular  diastolic parameters are consistent with Grade I diastolic dysfunction  (impaired relaxation).   2. Right ventricular systolic function is normal. The right ventricular  size is normal.   3. The mitral valve is normal in structure. Mild mitral valve  regurgitation.   4. The aortic valve is tricuspid. Aortic valve regurgitation is not  visualized. Aortic valve sclerosis/calcification is present, without any  evidence of aortic stenosis. Aortic valve mean gradient measures 6.0 mmHg.   5. The inferior vena cava is normal in size with greater than 50%  respiratory variability, suggesting right atrial pressure of 3 mmHg.   FINDINGS   Left Ventricle: Left ventricular ejection fraction, by estimation, is 60  to 65%. Left ventricular ejection fraction by 2D MOD biplane is 60.5 %.  The  left ventricle has normal function. The left ventricle has no regional  wall motion abnormalities. The  left ventricular internal cavity size was normal in size. There is no left  ventricular hypertrophy. Left ventricular diastolic parameters are  consistent with Grade I diastolic dysfunction (impaired relaxation).   Right Ventricle: The right ventricular size is normal. No increase in  right ventricular wall thickness. Right ventricular systolic function is  normal.   Left Atrium: Left atrial size was normal in size.   Right Atrium: Right atrial size was normal in size.   Pericardium: There is no evidence of pericardial effusion.   Mitral Valve: The mitral valve is normal in structure. Mild mitral valve  regurgitation.   Tricuspid Valve: The tricuspid valve is normal in structure. Tricuspid  valve regurgitation is mild.   Aortic Valve: The aortic valve is tricuspid. Aortic valve regurgitation is  not visualized. Aortic valve sclerosis/calcification is present, without  any evidence of aortic stenosis. Aortic valve mean gradient measures 6.0  mmHg. Aortic valve peak gradient  measures 12.2 mmHg. Aortic valve area, by VTI measures 2.27 cm.   Pulmonic Valve: The pulmonic valve was normal in structure. Pulmonic valve  regurgitation is not visualized.   Aorta: The aortic root and ascending aorta are structurally normal, with  no evidence of dilitation.   Venous: The inferior vena cava is normal in size with greater than 50%  respiratory variability, suggesting right atrial pressure of 3 mmHg.   IAS/Shunts: No atrial level shunt detected by color flow Doppler.     LEFT VENTRICLE  PLAX 2D                        Biplane EF (MOD)  LVIDd:         5.00 cm         LV Biplane EF:   Left  LVIDs:         3.50 cm  ventricular  LV PW:         0.90 cm                          ejection  LV IVS:        0.90 cm                          fraction by  LVOT diam:      2.20 cm                          2D MOD  LV SV:         79                               biplane is  LV SV Index:   38                               60.5 %.  LVOT Area:     3.80 cm                                 Diastology                                 LV e' medial:    6.42 cm/s  LV Volumes (MOD)               LV E/e' medial:  10.1  LV vol d, MOD    75.7 ml       LV e' lateral:   8.81 cm/s  A2C:                           LV E/e' lateral: 7.3  LV vol d, MOD    97.3 ml  A4C:  LV vol s, MOD    31.0 ml  A2C:  LV vol s, MOD    35.6 ml  A4C:  LV SV MOD A2C:   44.7 ml  LV SV MOD A4C:   97.3 ml  LV SV MOD BP:    56.3 ml   RIGHT VENTRICLE  RV Basal diam:  2.90 cm  RV Mid diam:    2.90 cm  RV S prime:     10.40 cm/s  TAPSE (M-mode): 2.0 cm   LEFT ATRIUM             Index        RIGHT ATRIUM           Index  LA Vol (A2C):   71.6 ml 34.70 ml/m  RA Area:     16.70 cm  LA Vol (A4C):   63.3 ml 30.67 ml/m  RA Volume:   43.40 ml  21.03 ml/m  LA Biplane Vol: 67.9 ml 32.90 ml/m   AORTIC VALVE  AV Area (Vmax):    2.32 cm  AV Area (Vmean):   2.24 cm  AV Area (VTI):     2.27 cm  AV Vmax:           175.00 cm/s  AV Vmean:  117.000 cm/s  AV VTI:            0.348 m  AV Peak Grad:      12.2 mmHg  AV Mean Grad:      6.0 mmHg  LVOT Vmax:         107.00 cm/s  LVOT Vmean:        68.800 cm/s  LVOT VTI:          0.208 m  LVOT/AV VTI ratio: 0.60    AORTA  Ao Sinus diam: 3.30 cm  Ao STJ diam:   2.6 cm  Ao Asc diam:   3.00 cm   MITRAL VALVE               TRICUSPID VALVE  MV Area (PHT): 2.50 cm    TR Peak grad:   13.1 mmHg  MV Decel Time: 303 msec    TR Vmax:        181.00 cm/s  MV E velocity: 64.60 cm/s  MV A velocity: 84.10 cm/s  SHUNTS  MV E/A ratio:  0.77        Systemic VTI:  0.21 m                             Systemic Diam: 2.20 cm   Redell Cave MD  Electronically signed by Redell Cave MD  Signature Date/Time: 08/26/2023/4:26:01 PM        Final      Treatments: surgery: 10/09/2023 Patient:  Christopher Oconnell Pre-Op Dx: LILLA CAD HTN DM   Stable angina Post-op Dx:  same Procedure: CABG X 2.  LIMA OM, RSVG PDA   Endoscopic greater saphenous vein harvest on the right     Surgeon and Role:      * Lightfoot, Linnie KIDD, MD - Primary    * EMERSON Becket , PA-C - assisting An experienced assistant was required given the complexity of this surgery and the standard of surgical care. The assistant was needed for exposure, dissection, suctioning, retraction of delicate tissues and sutures, instrument exchange and for overall help during this procedure.     Discharge Exam: Blood pressure (!) 151/70, pulse 98, temperature 97.9 F (36.6 C), temperature source Oral, resp. rate 18, height 5' 8 (1.727 m), weight 91.3 kg, SpO2 98%.  General appearance: alert, cooperative, and no distress Heart: regular rate and rhythm Lungs: clear to auscultation bilaterally Abdomen: benign Extremities: no edema Wound: incis healing well  Discharge Medications:  The patient has been discharged on:   1.Beta Blocker:  Yes [ X  ]                              No   [   ]                              If No, reason:  2.Ace Inhibitor/ARB: Yes [x   ]                                     No  [    ]  If No, reason:  3.Statin:   Yes Galerius.Gant   ]                  No  [   ]                  If No, reason:  4.Ecasa:  Yes  [   X]                  No   [   ]                  If No, reason:  Patient had ACS upon admission: No  Plavix/P2Y12 inhibitor: Yes [   ]                                      No  [  n ]     Discharge Instructions     Amb Referral to Cardiac Rehabilitation   Complete by: As directed    Diagnosis: CABG   CABG X ___: 2   After initial evaluation and assessments completed: Virtual Based Care may be provided alone or in conjunction with Phase 2 Cardiac Rehab based on patient barriers.: Yes   Intensive  Cardiac Rehabilitation (ICR) [facility] location only OR Traditional Cardiac Rehabilitation (TCR) *If criteria for ICR are not met will enroll in TCR ([facility] only): Yes      Allergies as of 10/13/2023   No Known Allergies      Medication List     STOP taking these medications    isosorbide  mononitrate 30 MG 24 hr tablet Commonly known as: IMDUR    nitroGLYCERIN  0.4 MG SL tablet Commonly known as: NITROSTAT    triamterene -hydrochlorothiazide 37.5-25 MG tablet Commonly known as: MAXZIDE-25       TAKE these medications    acetaminophen  325 MG tablet Commonly known as: Tylenol  Take 2 tablets (650 mg total) by mouth every 6 (six) hours as needed for mild pain (pain score 1-3).   aspirin  EC 325 MG tablet Take 1 tablet (325 mg total) by mouth daily. What changed:  medication strength how much to take   atorvastatin  40 MG tablet Commonly known as: LIPITOR TAKE 1 TABLET BY MOUTH EVERY DAY   enalapril  20 MG tablet Commonly known as: VASOTEC  TAKE 1 TABLET BY MOUTH EVERY DAY   FISH OIL PO Take 1 capsule by mouth daily.   fluticasone -salmeterol 250-50 MCG/ACT Aepb Commonly known as: ADVAIR Inhale 1 puff into the lungs in the morning and at bedtime.   freestyle lancets Use 1 lancet as directed fingerstick Once a day to test blood sugar for Dx: E11.9   furosemide  40 MG tablet Commonly known as: LASIX  Take 1 tablet (40 mg total) by mouth daily.   metFORMIN  500 MG tablet Commonly known as: GLUCOPHAGE  TAKE 1 TABLET BY MOUTH TWICE A DAY WITH FOOD   metoprolol  tartrate 50 MG tablet Commonly known as: LOPRESSOR  Take 1 tablet (50 mg total) by mouth 2 (two) times daily.   oxyCODONE  5 MG immediate release tablet Commonly known as: Oxy IR/ROXICODONE  Take 1 tablet (5 mg total) by mouth every 6 (six) hours as needed for up to 7 days for severe pain (pain score 7-10).   pantoprazole  40 MG tablet Commonly known as: PROTONIX  Take 40 mg by mouth daily as needed (heart burn).    potassium chloride  SA 20 MEQ tablet  Commonly known as: KLOR-CON  M Take 2 tablets (40 mEq total) by mouth daily.   Potassium Gluconate 550 MG Tabs Take 1 tablet by mouth 2 (two) times a week.   QC TUMERIC COMPLEX PO Take 1 capsule by mouth daily.   tamsulosin  0.4 MG Caps capsule Commonly known as: FLOMAX  Take 1 capsule (0.4 mg total) by mouth daily.        Follow-up Information     Shyrl Linnie KIDD, MD Follow up on 10/25/2023.   Specialty: Cardiothoracic Surgery Why: Virtual follow up appointment is at 2:00PM. Please do NOT come to the office as this is a VIRTUAL appointment, Dr. Shyrl will call you. Contact information: [address] [place] [address] [phone]         Loistine Sober, NP Follow up on 10/30/2023.   Specialty: Cardiology Why: Cardiology appointment is at 10:05AM Contact information: [address] [place] [address] [phone]                 Signed:  Lemond FORBES Cera, PA-C  10/13/2023, 10:28 AM

## 2023-10-10 NOTE — Progress Notes (Signed)
 NAME:  Christopher Oconnell, MRN:  [id number], DOB:  [birth year], LOS: 1 ADMISSION DATE:  10/09/2023, CONSULTATION DATE:  7/30 REFERRING MD:  Shyrl MA CHIEF COMPLAINT:  s/p CABG   History of Present Illness:  73 year old male with past medical history of arthritis, diabetes, GERD, hypertension, hyperlipidemia, interstitial lung disease, CAD who presented to Dr. Shyrl outpatient for two-vessel coronary artery disease.  He had echo on 6/16 with LVEF 60 to 65%, G1 DD, mild MR and TR.  6/23 left heart cath showing 99% mid RCA stenosis and 90% ostial LCx stenosis.  7/30 CABG Pump time: 70m  X clamp time: 30m EBL: 500  Pertinent  Medical History  Arthritis, diabetes, GERD, hypertension, hyperlipidemia, interstitial lung disease, CAD  Significant Hospital Events: Including procedures, antibiotic start and stop dates in addition to other pertinent events   7/30: CABG x 2 with Dr. Shyrl  Interim History / Subjective:  Patient was extubated per rapid weaning protocol Complaining of surgical site pain 4/10, also complaining of nausea  Objective   Blood pressure 126/68, pulse 77, temperature 97.9 F (36.6 C), resp. rate 14, height 5' 8 (1.727 m), weight 93.1 kg, SpO2 96%. CVP:  [0 mmHg-55 mmHg] 7 mmHg CO:  [5.5 L/min-10.7 L/min] 9.5 L/min CI:  [2.7 L/min/m2-5.3 L/min/m2] 4.7 L/min/m2  Vent Mode: PSV;CPAP FiO2 (%):  [40 %-50 %] 40 % Set Rate:  [4 bmp-16 bmp] 4 bmp Vt Set:  [540 mL] 540 mL PEEP:  [5 cmH20] 5 cmH20 Pressure Support:  [10 cmH20] 10 cmH20 Plateau Pressure:  [17 cmH20] 17 cmH20   Intake/Output Summary (Last 24 hours) at 10/10/2023 9167 Last data filed at 10/10/2023 0800 Gross per 24 hour  Intake 4328.23 ml  Output 3625 ml  Net 703.23 ml   Filed Weights   10/09/23 0559 10/10/23 0500  Weight: 89.8 kg 93.1 kg    Examination: General: Elderly male, lying on the bed HEENT: [place]/AT, eyes anicteric.  moist mucus membranes Neuro: Alert, awake following  commands Chest: Central sternotomy incision looks clean and dry, coarse breath sounds, no wheezes or rhonchi.  Mediastinal, chest tube and pacer wires in place Heart: Regular rate and rhythm, no murmurs or gallops Abdomen: Soft, nontender, nondistended, bowel sounds present  Labs and images reviewed  Patient Lines/Drains/Airways Status     Active Line/Drains/Airways     Name Placement date Placement time Site Days   Arterial Line 10/09/23 Left Radial 10/09/23  0721  Radial  1   Peripheral IV 10/09/23 20 G Right Hand 10/09/23  0708  Hand  1   Urethral Catheter K. Everhart RN Latex 16 Fr. 10/09/23  0810  Latex  1   Y Chest Tube 1 and 2 1 Medial Mediastinal 19 Fr. 2 Left Pleural 19 Fr. 10/09/23  1129  -- 1   Wound 10/09/23 0924 Surgical Closed Surgical Incision Chest Other (Comment) 10/09/23  0924  Chest  1   Wound 10/09/23 0924 Surgical Closed Surgical Incision Leg Right 10/09/23  0924  Leg  1        Resolved Hospital Problem list     Assessment & Plan:  Multivessel coronary artery disease s/p CABG x 2 Continue aspirin  and statin Chest tube management TCTS Chest tube output was 270 cc Started on low-dose metoprolol  Continue pain control with tramadol , oxycodone  and morphine  Closely monitor chest tube output  Acute respiratory insufficiency, postop Interstitial lung disease, NSIP [facility]. Last seen 04/2023. On pirfenidone 267mg  TID.  Patient was successfully  extubated per rapid weaning protocol Currently on nasal cannula oxygen, encourage incentive spirometry and ambulation  Chronic HFpEF Monitor intake and output EF 60 to 65% with grade 1 diastolic dysfunction Started on metoprolol   Hypertension Continue metoprolol   Hyperlipidemia Continue atorvastatin   Diabetes type 2 Patient hemoglobin A1c is 6.4 Transition insulin  infusion to sliding scale and long-acting insulin  At home he takes metformin , hold for now  Expected perioperative blood loss  anemia Monitor H/H and PLT counts   Best Practice (right click and Reselect all SmartList Selections daily)   Diet/type:  Advance as tolerated DVT prophylaxis: SCD GI prophylaxis: PPI Lines: Central line, Arterial Line, and yes and it is still needed Foley:  Yes, and it is still needed Code Status:  full code Last date of multidisciplinary goals of care discussion [per primary]  Labs   CBC: Recent Labs  Lab 10/07/23 1500 10/09/23 0821 10/09/23 1049 10/09/23 1055 10/09/23 1250 10/09/23 1455 10/09/23 1611 10/09/23 1806 10/10/23 0446  WBC 9.7  --   --   --  13.5*  --   --  17.6* 17.5*  HGB 14.5   < > 9.8*   < > 9.2*  10.8* 10.5* 10.2* 12.4* 12.2*  HCT 43.1   < > 28.9*   < > 27.0*  32.0* 31.0* 30.0* 36.2* 35.6*  MCV 95.8  --   --   --  96.1  --   --  95.5 94.4  PLT 294  --  189  --  153  --   --  193 197   < > = values in this interval not displayed.    Basic Metabolic Panel: Recent Labs  Lab 10/07/23 1500 10/09/23 0821 10/09/23 0933 10/09/23 1029 10/09/23 1055 10/09/23 1147 10/09/23 1153 10/09/23 1250 10/09/23 1455 10/09/23 1611 10/09/23 1806 10/10/23 0446  NA 138   < > 139   < > 138   < > 139 143 143 143 140 136  K 4.5   < > 3.5   < > 3.9   < > 3.6 2.8* 3.1* 3.2* 3.8 3.8  CL 102   < > 101  --  102  --  103  --   --   --  106 107  CO2 28  --   --   --   --   --   --   --   --   --  24 23  GLUCOSE 133*   < > 147*  --  111*  --  135*  --   --   --  121* 126*  BUN 11   < > 9  --  8  --  8  --   --   --  9 8  CREATININE 1.04   < > 0.80  --  0.70  --  0.70  --   --   --  0.86 0.85  CALCIUM  9.8  --   --   --   --   --   --   --   --   --  7.8* 7.6*  MG  --   --   --   --   --   --   --   --   --   --  2.9* 2.4   < > = values in this interval not displayed.   GFR: Estimated Creatinine Clearance: 84.4 mL/min (by C-G formula based on SCr of 0.85 mg/dL). Recent Labs  Lab 10/07/23 1500 10/09/23 1250 10/09/23  1806 10/10/23 0446  WBC 9.7 13.5* 17.6* 17.5*     Liver Function Tests: Recent Labs  Lab 10/07/23 1500  AST 17  ALT 16  ALKPHOS 70  BILITOT 0.8  PROT 6.8  ALBUMIN  4.2   No results for input(s): LIPASE, AMYLASE in the last 168 hours. No results for input(s): AMMONIA in the last 168 hours.  ABG    Component Value Date/Time   PHART 7.282 (L) 10/09/2023 1611   PCO2ART 42.6 10/09/2023 1611   PO2ART 85 10/09/2023 1611   HCO3 20.4 10/09/2023 1611   TCO2 22 10/09/2023 1611   ACIDBASEDEF 6.0 (H) 10/09/2023 1611   O2SAT 96 10/09/2023 1611     Coagulation Profile: Recent Labs  Lab 10/07/23 1500 10/09/23 1250  INR 1.0 1.4*    Cardiac Enzymes: No results for input(s): CKTOTAL, CKMB, CKMBINDEX, TROPONINI in the last 168 hours.  HbA1C: Hemoglobin A1C  Date/Time Value Ref Range Status  05/30/2023 08:51 AM 6.0 (A) 4.0 - 5.6 % Final  11/21/2022 09:09 AM 6.9 (A) 4.0 - 5.6 % Final  05/22/2018 12:00 AM 7.5  Final   Hgb A1c MFr Bld  Date/Time Value Ref Range Status  10/07/2023 02:31 PM 6.4 (H) 4.8 - 5.6 % Final    Comment:    (NOTE) Diagnosis of Diabetes The following HbA1c ranges recommended by the American Diabetes Association (ADA) may be used as an aid in the diagnosis of diabetes mellitus.  Hemoglobin             Suggested A1C NGSP%              Diagnosis  <5.7                   Non Diabetic  5.7-6.4                Pre-Diabetic  >6.4                   Diabetic  <7.0                   Glycemic control for                       adults with diabetes.    05/21/2022 03:13 PM 6.5 4.6 - 6.5 % Final    Comment:    Glycemic Control Guidelines for People with Diabetes:Non Diabetic:  <6%Goal of Therapy: <7%Additional Action Suggested:  >8%     CBG: Recent Labs  Lab 10/09/23 2252 10/10/23 0105 10/10/23 0257 10/10/23 0453 10/10/23 0645  GLUCAP 138* 117* 126* 132* 120*     Valinda Novas, MD [facility] See Amion for pager If no response to pager, please call [phone] until  7pm After 7pm, Please call E-link [phone]

## 2023-10-10 NOTE — Plan of Care (Signed)
  Problem: Nutrition: Goal: Adequate nutrition will be maintained Outcome: Progressing   Problem: Coping: Goal: Level of anxiety will decrease Outcome: Progressing   Problem: Pain Managment: Goal: General experience of comfort will improve and/or be controlled Outcome: Progressing   Problem: Safety: Goal: Ability to remain free from injury will improve Outcome: Progressing   Problem: Education: Goal: Will demonstrate proper wound care and an understanding of methods to prevent future damage Outcome: Progressing   Problem: Cardiac: Goal: Will achieve and/or maintain hemodynamic stability Outcome: Progressing   Problem: Respiratory: Goal: Respiratory status will improve Outcome: Progressing

## 2023-10-10 NOTE — TOC Initial Note (Signed)
 Transition of Care [facility]) - Initial/Assessment Note    Patient Details  Name: Christopher Oconnell MRN: [id number] Date of Birth: [birth year]  Transition of [facility]) CM/SW Contact:    Sudie Erminio Deems, RN Phone Number: 10/10/2023, 4:02 PM  Clinical Narrative: Patient presented for CABG-POD 1 CABG. PTA patient was from home with spouse. Spouse was at the bedside during the visit. Patient does not use any DME in the home. Patient reports he has a PCP and he gets to appointments without any issues. [facility] is following the patient via TCTS office protocol referral. Case Manager will continue to follow for additional needs.             Expected Discharge Plan: Home w Home Health Services Barriers to Discharge: Continued Medical Work up   Patient Goals and CMS Choice Patient states their goals for this hospitalization and ongoing recovery are:: plans to return home once stable   Choice offered to / list presented to : NA     Expected Discharge Plan and Services In-house Referral: NA Discharge Planning Services: CM Consult Post Acute Care Choice: Home Health Living arrangements for the past 2 months: Single Family Home                   DME Agency: NA       HH Arranged: PT, OT          Prior Living Arrangements/Services Living arrangements for the past 2 months: Single Family Home Lives with:: Spouse Patient language and need for interpreter reviewed:: Yes Do you feel safe going back to the place where you live?: Yes      Need for Family Participation in Patient Care: Yes (Comment) Care giver support system in place?: Yes (comment)   Criminal Activity/Legal Involvement Pertinent to Current Situation/Hospitalization: No - Comment as needed  Activities of Daily Living      Permission Sought/Granted Permission sought to share information with : Family Supports, Case Manager                Emotional Assessment Appearance:: Appears stated  age Attitude/Demeanor/Rapport: Engaged Affect (typically observed): Appropriate Orientation: : Oriented to Self, Oriented to Place, Oriented to  Time, Oriented to Situation Alcohol / Substance Use: Not Applicable Psych Involvement: No (comment)  Admission diagnosis:  Coronary artery disease involving native coronary artery of native heart, unspecified whether angina present [I25.10] S/P CABG (coronary artery bypass graft) [Z95.1] Patient Active Problem List   Diagnosis Date Noted   S/P CABG (coronary artery bypass graft) 10/09/2023   Accelerating angina (HCC) 07/06/2023   Decreased renal function 06/27/2023   Lightheadedness 06/27/2023   Current use of proton pump inhibitor 05/30/2023   Open wound of left foot excluding one or more toes 11/21/2022   Interstitial lung disease (HCC) 08/30/2022   Preventative health care 05/21/2022   DOE (dyspnea on exertion) 03/21/2022   Wart of hand 05/10/2021   COVID-19 01/25/2021   Dysplastic nevus 11/08/2020   Hyperlipidemia associated with type 2 diabetes mellitus (HCC) 08/17/2020   Former smoker 05/19/2020   Arthritis of knee 07/30/2019   Family history of melanoma 07/30/2019   History of melanoma 07/30/2019   Coronary artery disease of native artery of native heart with stable angina pectoris (HCC) 02/11/2019   Hyperlipidemia LDL goal <70 02/11/2019   Essential hypertension 02/11/2019   Type 2 diabetes mellitus with neurological complications (HCC) 02/11/2019   PCP:  Wendee Lynwood HERO, NP Pharmacy:   [facility] [phone] - [place], [place] -  [address] [place] [address] Phone: [phone] Fax: [phone]     Social Drivers of Health (SDOH) Social History: SDOH Screenings   Food Insecurity: No Food Insecurity (10/10/2023)  Housing: Low Risk  (10/10/2023)  Transportation Needs: No Transportation Needs (10/10/2023)  Utilities: Not At Risk (10/10/2023)  Alcohol Screen: Low Risk  (05/03/2023)  Depression  (PHQ2-9): Low Risk  (05/03/2023)  Financial Resource Strain: Patient Declined (08/26/2023)  Physical Activity: Inactive (08/26/2023)  Social Connections: Socially Integrated (10/10/2023)  Stress: No Stress Concern Present (08/26/2023)  Tobacco Use: Medium Risk (10/09/2023)  Health Literacy: Adequate Health Literacy (05/03/2023)   SDOH Interventions:     Readmission Risk Interventions     No data to display

## 2023-10-10 NOTE — Progress Notes (Addendum)
 [address] [place] [employer] [address]             [phone]      1 Day Post-Op Procedure(s) (LRB): CORONARY ARTERY BYPASS GRAFTING (CABG) TIMES TWO USING LEFT INTERNAL MAMMARY ARTERY AND ENDOSCOPICALLY HARVESTED RIGHT GREATER SAPHENOUS VEIN (N/A) ECHOCARDIOGRAM, TRANSESOPHAGEAL (N/A) Subjective: Patient reports his pain is a 4, he was a little queezy this morning but this improved with zofran .   Objective: Vital signs in last 24 hours: Temp:  [96.4 F (35.8 C)-97.9 F (36.6 C)] 97.9 F (36.6 C) (07/31 0515) Pulse Rate:  [71-94] 76 (07/31 0700) Cardiac Rhythm: Normal sinus rhythm;Sinus tachycardia (07/31 0003) Resp:  [9-29] 14 (07/31 0700) BP: (111-147)/(61-83) 118/61 (07/31 0700) SpO2:  [92 %-99 %] 97 % (07/31 0700) Arterial Line BP: (88-163)/(41-95) 122/51 (07/31 0700) FiO2 (%):  [40 %-50 %] 40 % (07/30 1436) Weight:  [93.1 kg] 93.1 kg (07/31 0500)  Hemodynamic parameters for last 24 hours: CVP:  [0 mmHg-55 mmHg] 7 mmHg CO:  [5.5 L/min-10.7 L/min] 9.7 L/min CI:  [2.7 L/min/m2-5.3 L/min/m2] 4.8 L/min/m2  Intake/Output from previous day: 07/30 0701 - 07/31 0700 In: 4607.1 [P.O.:240; I.V.:2490.1; Blood:230; IV Piggyback:1647] Out: 3485 [Urine:2570; Blood:645; Chest Tube:270] Intake/Output this shift: No intake/output data recorded.  General appearance: alert, cooperative, and no distress Neurologic: intact Heart: regular rate and rhythm, S1, S2 normal, no murmur, click, rub or gallop Lungs: diminished bibasilar breath sounds Abdomen: hypoactive bowel sounds, nontender, no distension Extremities: SCDs in place Wound: Clean and dry EVH site, clean and dry dressing over sternal incision  Lab Results: Recent Labs    10/09/23 1806 10/10/23 0446  WBC 17.6* 17.5*  HGB 12.4* 12.2*  HCT 36.2* 35.6*  PLT 193 197   BMET:  Recent Labs    10/09/23 1806 10/10/23 0446  NA 140 136  K 3.8 3.8  CL 106 107  CO2 24 23  GLUCOSE 121* 126*  BUN 9 8   CREATININE 0.86 0.85  CALCIUM  7.8* 7.6*    PT/INR:  Recent Labs    10/09/23 1250  LABPROT 17.5*  INR 1.4*   ABG    Component Value Date/Time   PHART 7.282 (L) 10/09/2023 1611   HCO3 20.4 10/09/2023 1611   TCO2 22 10/09/2023 1611   ACIDBASEDEF 6.0 (H) 10/09/2023 1611   O2SAT 96 10/09/2023 1611   CBG (last 3)  Recent Labs    10/10/23 0257 10/10/23 0453 10/10/23 0645  GLUCAP 126* 132* 120*    Assessment/Plan: S/P Procedure(s) (LRB): CORONARY ARTERY BYPASS GRAFTING (CABG) TIMES TWO USING LEFT INTERNAL MAMMARY ARTERY AND ENDOSCOPICALLY HARVESTED RIGHT GREATER SAPHENOUS VEIN (N/A) ECHOCARDIOGRAM, TRANSESOPHAGEAL (N/A)  Neuro: Pain controlled  CV: NSR, HR 70s. Cuff BP 118/61, MAP 78-82. CI 4.6. D/C EPW and start low dose Lopressor . Off drips, d/c swan and arterial line.   Pulm: CT output 270cc/24hrs. CT without air leak. Leave in place for now. Hx of NSIP, was prescribed Pirfenidone by [facility] clinic pulmonology but do not see this in his home meds, unsure if taking? CXR with bibasilar atelectasis L>R, possibly left pleural effusion, pulmonary congestion. Saturating well on 3L [place] this AM. Encourage IS and ambulation.   GI: Some nausea this AM, improved with zofran . Continue Zofran  and Reglan . Advance diet as tolerated.   Endo: T2DM preop A1C 6.4. On Metformin  at home. CBGs well controlled on insulin  drip, will transition to SSI.   Renal: Cr 0.85. K 3.8, supplement. UO  2570cc/24hrs. +7lbs from preop weight. Will discuss possible Lasix  with Dr. Shyrl vs hold until tomorrow.    ID: Likely reactive leukocytosis, WBC 17.5. Afebrile. Clinically monitor.   Expected postop: H/H 12.2/35.6, stable. Not clinically significant at this time.   DVT Prophylaxis: Start Lovenox   Dispo: POD1 progression   LOS: 1 day    Con GORMAN Bend, PA-C 10/10/2023  Agree Doing well POD 1 progression Possible floor today  Jamacia Jester O Bobbie Valletta

## 2023-10-10 NOTE — Anesthesia Postprocedure Evaluation (Signed)
 Anesthesia Post Note  Patient: Christopher Oconnell  Procedure(Oconnell) Performed: CORONARY ARTERY BYPASS GRAFTING (CABG) TIMES TWO USING LEFT INTERNAL MAMMARY ARTERY AND ENDOSCOPICALLY HARVESTED RIGHT GREATER SAPHENOUS VEIN (Chest) ECHOCARDIOGRAM, TRANSESOPHAGEAL     Patient location during evaluation: SICU Anesthesia Type: General Level of consciousness: sedated Pain management: pain level controlled Vital Signs Assessment: post-procedure vital signs reviewed and stable Respiratory status: patient remains intubated per anesthesia plan Cardiovascular status: stable Postop Assessment: no apparent nausea or vomiting Anesthetic complications: no   There were no known notable events for this encounter.  Last Vitals:  Vitals:   10/10/23 0745 10/10/23 0800  BP:  126/68  Pulse: 71 77  Resp: 11 14  Temp:    SpO2: 97% 96%    Last Pain:  Vitals:   10/10/23 0847  TempSrc:   PainSc: 6                  Christopher Oconnell

## 2023-10-10 NOTE — Progress Notes (Signed)
 CT output 350 mL from 0700-1800. Draining ~100 mL every time pt stands. Spoke to MD during evening rounds. Keep chest tubes in tonight per Dr Lucas.

## 2023-10-11 ENCOUNTER — Inpatient Hospital Stay (HOSPITAL_COMMUNITY)

## 2023-10-11 DIAGNOSIS — Z951 Presence of aortocoronary bypass graft: Secondary | ICD-10-CM

## 2023-10-11 DIAGNOSIS — I25118 Atherosclerotic heart disease of native coronary artery with other forms of angina pectoris: Secondary | ICD-10-CM | POA: Diagnosis not present

## 2023-10-11 DIAGNOSIS — E785 Hyperlipidemia, unspecified: Secondary | ICD-10-CM | POA: Diagnosis not present

## 2023-10-11 DIAGNOSIS — I10 Essential (primary) hypertension: Secondary | ICD-10-CM | POA: Diagnosis not present

## 2023-10-11 LAB — CBC
HCT: 35.6 % — ABNORMAL LOW (ref 39.0–52.0)
Hemoglobin: 12 g/dL — ABNORMAL LOW (ref 13.0–17.0)
MCH: 32.3 pg (ref 26.0–34.0)
MCHC: 33.7 g/dL (ref 30.0–36.0)
MCV: 96 fL (ref 80.0–100.0)
Platelets: 204 K/uL (ref 150–400)
RBC: 3.71 MIL/uL — ABNORMAL LOW (ref 4.22–5.81)
RDW: 12.2 % (ref 11.5–15.5)
WBC: 15.1 K/uL — ABNORMAL HIGH (ref 4.0–10.5)
nRBC: 0 % (ref 0.0–0.2)

## 2023-10-11 LAB — BASIC METABOLIC PANEL WITH GFR
Anion gap: 9 (ref 5–15)
BUN: 7 mg/dL — ABNORMAL LOW (ref 8–23)
CO2: 25 mmol/L (ref 22–32)
Calcium: 7.9 mg/dL — ABNORMAL LOW (ref 8.9–10.3)
Chloride: 101 mmol/L (ref 98–111)
Creatinine, Ser: 0.91 mg/dL (ref 0.61–1.24)
GFR, Estimated: >60 mL/min (ref >60)
Glucose, Bld: 132 mg/dL — ABNORMAL HIGH (ref 70–99)
Potassium: 3.6 mmol/L (ref 3.5–5.1)
Sodium: 135 mmol/L (ref 135–145)

## 2023-10-11 LAB — GLUCOSE, CAPILLARY
Glucose-Capillary: 121 mg/dL — ABNORMAL HIGH (ref 70–99)
Glucose-Capillary: 132 mg/dL — ABNORMAL HIGH (ref 70–99)
Glucose-Capillary: 136 mg/dL — ABNORMAL HIGH (ref 70–99)
Glucose-Capillary: 144 mg/dL — ABNORMAL HIGH (ref 70–99)
Glucose-Capillary: 182 mg/dL — ABNORMAL HIGH (ref 70–99)
Glucose-Capillary: 199 mg/dL — ABNORMAL HIGH (ref 70–99)

## 2023-10-11 MED ORDER — INSULIN ASPART 100 UNIT/ML IJ SOLN: 0.0000 [IU] | Freq: Every day | INTRAMUSCULAR | Status: DC

## 2023-10-11 MED ORDER — INSULIN GLARGINE-YFGN 100 UNIT/ML ~~LOC~~ SOLN
5.0000 [IU] | Freq: Every day | SUBCUTANEOUS | Status: DC
  Administered 2023-10-11 – 2023-10-12 (×2): 5 [IU] via SUBCUTANEOUS
  Filled 2023-10-11 (×3): qty 0.05

## 2023-10-11 MED ORDER — POTASSIUM CHLORIDE CRYS ER 20 MEQ PO TBCR
20.0000 meq | EXTENDED_RELEASE_TABLET | ORAL | Status: AC
  Administered 2023-10-11 (×3): 20 meq via ORAL
  Filled 2023-10-11 (×3): qty 1

## 2023-10-11 MED ORDER — TAMSULOSIN HCL 0.4 MG PO CAPS
0.4000 mg | ORAL_CAPSULE | Freq: Every day | ORAL | Status: DC
  Administered 2023-10-11 – 2023-10-13 (×3): 0.4 mg via ORAL
  Filled 2023-10-11 (×3): qty 1

## 2023-10-11 MED ORDER — INSULIN ASPART 100 UNIT/ML IJ SOLN
0.0000 [IU] | Freq: Three times a day (TID) | INTRAMUSCULAR | Status: DC
  Administered 2023-10-11: 3 [IU] via SUBCUTANEOUS
  Administered 2023-10-11 – 2023-10-12 (×3): 2 [IU] via SUBCUTANEOUS

## 2023-10-11 MED ORDER — FUROSEMIDE 40 MG PO TABS
40.0000 mg | ORAL_TABLET | Freq: Every day | ORAL | Status: DC
  Administered 2023-10-12 – 2023-10-13 (×2): 40 mg via ORAL
  Filled 2023-10-11 (×2): qty 1

## 2023-10-11 MED ORDER — FUROSEMIDE 10 MG/ML IJ SOLN
40.0000 mg | Freq: Once | INTRAMUSCULAR | Status: AC
  Administered 2023-10-11: 40 mg via INTRAVENOUS
  Filled 2023-10-11: qty 4

## 2023-10-11 MED ORDER — METOCLOPRAMIDE HCL 5 MG PO TABS
10.0000 mg | ORAL_TABLET | Freq: Three times a day (TID) | ORAL | Status: AC
  Administered 2023-10-11 (×4): 10 mg via ORAL
  Filled 2023-10-11: qty 2
  Filled 2023-10-11 (×3): qty 1

## 2023-10-11 MED ORDER — INSULIN GLARGINE-YFGN 100 UNIT/ML ~~LOC~~ SOLN
5.0000 [IU] | Freq: Two times a day (BID) | SUBCUTANEOUS | Status: DC
  Filled 2023-10-11: qty 0.05

## 2023-10-11 NOTE — Plan of Care (Signed)
  Problem: Education: Goal: Knowledge of General Education information will improve Description: Including pain rating scale, medication(s)/side effects and non-pharmacologic comfort measures Outcome: Progressing   Problem: Health Behavior/Discharge Planning: Goal: Ability to manage health-related needs will improve Outcome: Progressing   Problem: Clinical Measurements: Goal: Ability to maintain clinical measurements within normal limits will improve Outcome: Progressing Goal: Will remain free from infection Outcome: Adequate for Discharge Goal: Diagnostic test results will improve Outcome: Adequate for Discharge Goal: Respiratory complications will improve Outcome: Adequate for Discharge Goal: Cardiovascular complication will be avoided Outcome: Progressing   Problem: Activity: Goal: Risk for activity intolerance will decrease Outcome: Progressing   Problem: Nutrition: Goal: Adequate nutrition will be maintained Outcome: Adequate for Discharge   Problem: Coping: Goal: Level of anxiety will decrease Outcome: Adequate for Discharge   Problem: Elimination: Goal: Will not experience complications related to bowel motility Outcome: Progressing

## 2023-10-11 NOTE — Progress Notes (Signed)
Pt ambulated x 470 feet around unit independently, pt tolerated well  

## 2023-10-11 NOTE — Progress Notes (Signed)
 Pt arrived from ...2h.., A/ox .Marland Kitchen4.pt denies any pain, MD aware,CCMD called. CHG bath given,no further needs at this time

## 2023-10-11 NOTE — Progress Notes (Signed)
 NAME:  Christopher Oconnell, MRN:  [id number], DOB:  1950/05/09, LOS: 2 ADMISSION DATE:  10/09/2023, CONSULTATION DATE:  7/30 REFERRING MD:  Shyrl MA CHIEF COMPLAINT:  s/p CABG   History of Present Illness:  73 year old male with past medical history of arthritis, diabetes, GERD, hypertension, hyperlipidemia, interstitial lung disease, CAD who presented to Dr. Shyrl outpatient for two-vessel coronary artery disease.  He had echo on 6/16 with LVEF 60 to 65%, G1 DD, mild MR and TR.  6/23 left heart cath showing 99% mid RCA stenosis and 90% ostial LCx stenosis.  7/30 CABG Pump time: 43m  X clamp time: 24m EBL: 500  Pertinent  Medical History  Arthritis, diabetes, GERD, hypertension, hyperlipidemia, interstitial lung disease, CAD  Significant Hospital Events: Including procedures, antibiotic start and stop dates in addition to other pertinent events   7/30: CABG x 2 with Dr. Shyrl  Interim History / Subjective:  Patient is complaining of some nausea, had urinary retention required straight cath x 2  Stated surgical chest pain is better currently at 2/10   Objective   Blood pressure 129/83, pulse 77, temperature (!) 96.7 F (35.9 C), temperature source Axillary, resp. rate 16, height 5' 8 (1.727 m), weight 93.1 kg, SpO2 (!) 85%. CVP:  [7 mmHg] 7 mmHg CO:  [9.5 L/min] 9.5 L/min CI:  [4.7 L/min/m2] 4.7 L/min/m2      Intake/Output Summary (Last 24 hours) at 10/11/2023 0751 Last data filed at 10/11/2023 0200 Gross per 24 hour  Intake 462.08 ml  Output 1920 ml  Net -1457.92 ml   Filed Weights   10/09/23 0559 10/10/23 0500 10/11/23 0500  Weight: 89.8 kg 93.1 kg 93.1 kg    Examination: General: Elderly male, lying on the bed HEENT: [place]/AT, eyes anicteric.  moist mucus membranes Neuro: Alert, awake following commands Chest: Central sternotomy incision looks clean and dry, coarse breath sounds, no wheezes or rhonchi.  Mediastinal and chest tube with pacer wire in  place Heart: Regular rate and rhythm, no murmurs or gallops Abdomen: Soft, nontender, nondistended, bowel sounds present  Labs and images reviewed  Patient Lines/Drains/Airways Status     Active Line/Drains/Airways     Name Placement date Placement time Site Days   Peripheral IV 10/09/23 20 G Right Hand 10/09/23  0708  Hand  2   Y Chest Tube 1 and 2 1 Medial Mediastinal 19 Fr. 2 Left Pleural 19 Fr. 10/09/23  1129  -- 2   Wound 10/09/23 0924 Surgical Closed Surgical Incision Chest Other (Comment) 10/09/23  0924  Chest  2   Wound 10/09/23 0924 Surgical Closed Surgical Incision Leg Right 10/09/23  0924  Leg  2        Resolved Hospital Problem list     Assessment & Plan:  Multivessel coronary artery disease s/p CABG x 2 Continue aspirin  and statin Continue metoprolol  Chest tube management TCTS Chest tube output was 350 cc in last 24 hours Continue pain control with tramadol , oxycodone  and morphine  Closely monitor chest tube output  Postop nausea, likely due to opiate Continue Reglan  and Zofran  as needed  Acute respiratory insufficiency, postop, resolved Interstitial lung disease, NSIP Se[facility] Par[facility] at [facility]. Last seen 04/2023. On pirfenidone 267mg  TID.  Is on room air now  Chronic HFpEF Monitor intake and output EF 60 to 65% with grade 1 diastolic dysfunction Continue metoprolol  Will get Lasix  today, monitor intake and output  Hypertension Continue metoprolol   Hyperlipidemia Continue atorvastatin   Diabetes type 2 Patient hemoglobin A1c is  6.4 Sugars are controlled, continue sliding scale and long-acting insulin   Expected perioperative blood loss anemia Monitor H/H and PLT counts  Acute urinary retention Foley catheter was removed yesterday, patient required straight catheterization x 2 Closely monitor Continue tamsulosin   Best Practice (right click and Reselect all SmartList Selections daily)   Diet/type: Regular consistency DVT prophylaxis:  Enoxaparin  GI prophylaxis: PPI Lines: Central line, discontinued today Foley: N/A Code Status:  full code Last date of multidisciplinary goals of care discussion [per primary]  Labs   CBC: Recent Labs  Lab 10/09/23 1250 10/09/23 1455 10/09/23 1611 10/09/23 1806 10/10/23 0446 10/10/23 1612 10/11/23 0432  WBC 13.5*  --   --  17.6* 17.5* 16.1* 15.1*  HGB 9.2*  10.8*   < > 10.2* 12.4* 12.2* 11.8* 12.0*  HCT 27.0*  32.0*   < > 30.0* 36.2* 35.6* 35.1* 35.6*  MCV 96.1  --   --  95.5 94.4 95.6 96.0  PLT 153  --   --  193 197 194 204   < > = values in this interval not displayed.    Basic Metabolic Panel: Recent Labs  Lab 10/07/23 1500 10/09/23 0821 10/09/23 1153 10/09/23 1250 10/09/23 1611 10/09/23 1806 10/10/23 0446 10/10/23 1612 10/11/23 0432  NA 138   < > 139   < > 143 140 136 133* 135  K 4.5   < > 3.6   < > 3.2* 3.8 3.8 3.8 3.6  CL 102   < > 103  --   --  106 107 103 101  CO2 28  --   --   --   --  24 23 26 25   GLUCOSE 133*   < > 135*  --   --  121* 126* 182* 132*  BUN 11   < > 8  --   --  9 8 8  7*  CREATININE 1.04   < > 0.70  --   --  0.86 0.85 0.92 0.91  CALCIUM  9.8  --   --   --   --  7.8* 7.6* 7.7* 7.9*  MG  --   --   --   --   --  2.9* 2.4 2.0  --    < > = values in this interval not displayed.   GFR: Estimated Creatinine Clearance: 78.9 mL/min (by C-G formula based on SCr of 0.91 mg/dL). Recent Labs  Lab 10/09/23 1806 10/10/23 0446 10/10/23 1612 10/11/23 0432  WBC 17.6* 17.5* 16.1* 15.1*    Liver Function Tests: Recent Labs  Lab 10/07/23 1500  AST 17  ALT 16  ALKPHOS 70  BILITOT 0.8  PROT 6.8  ALBUMIN  4.2   No results for input(s): LIPASE, AMYLASE in the last 168 hours. No results for input(s): AMMONIA in the last 168 hours.  ABG    Component Value Date/Time   PHART 7.282 (L) 10/09/2023 1611   PCO2ART 42.6 10/09/2023 1611   PO2ART 85 10/09/2023 1611   HCO3 20.4 10/09/2023 1611   TCO2 22 10/09/2023 1611   ACIDBASEDEF 6.0 (H)  10/09/2023 1611   O2SAT 96 10/09/2023 1611     Coagulation Profile: Recent Labs  Lab 10/07/23 1500 10/09/23 1250  INR 1.0 1.4*    Cardiac Enzymes: No results for input(s): CKTOTAL, CKMB, CKMBINDEX, TROPONINI in the last 168 hours.  HbA1C: Hemoglobin A1C  Date/Time Value Ref Range Status  05/30/2023 08:51 AM 6.0 (A) 4.0 - 5.6 % Final  11/21/2022 09:09 AM 6.9 (A) 4.0 - 5.6 %  Final  05/22/2018 12:00 AM 7.5  Final   Hgb A1c MFr Bld  Date/Time Value Ref Range Status  10/07/2023 02:31 PM 6.4 (H) 4.8 - 5.6 % Final    Comment:    (NOTE) Diagnosis of Diabetes The following HbA1c ranges recommended by the American Diabetes Association (ADA) may be used as an aid in the diagnosis of diabetes mellitus.  Hemoglobin             Suggested A1C NGSP%              Diagnosis  <5.7                   Non Diabetic  5.7-6.4                Pre-Diabetic  >6.4                   Diabetic  <7.0                   Glycemic control for                       adults with diabetes.    05/21/2022 03:13 PM 6.5 4.6 - 6.5 % Final    Comment:    Glycemic Control Guidelines for People with Diabetes:Non Diabetic:  <6%Goal of Therapy: <7%Additional Action Suggested:  >8%     CBG: Recent Labs  Lab 10/10/23 1622 10/10/23 2028 10/11/23 0009 10/11/23 0351 10/11/23 0733  GLUCAP 180* 150* 136* 121* 199*     Valinda Novas, MD [facility] See Amion for pager If no response to pager, please call [phone] until 7pm After 7pm, Please call E-link [phone]

## 2023-10-11 NOTE — Progress Notes (Addendum)
 [address] [place] [employer] [address]             [phone]      2 Days Post-Op Procedure(s) (LRB): CORONARY ARTERY BYPASS GRAFTING (CABG) TIMES TWO USING LEFT INTERNAL MAMMARY ARTERY AND ENDOSCOPICALLY HARVESTED RIGHT GREATER SAPHENOUS VEIN (N/A) ECHOCARDIOGRAM, TRANSESOPHAGEAL (N/A) Subjective: Patient reports his pain is better this AM. He reports some nausea without vomiting that improved with zofran  but he has been able to eat pretty well. Ambulated around the unit this AM.   Objective: Vital signs in last 24 hours: Temp:  [97.8 F (36.6 C)-98.2 F (36.8 C)] 97.8 F (36.6 C) (08/01 0330) Pulse Rate:  [70-99] 77 (08/01 0600) Cardiac Rhythm: Normal sinus rhythm (08/01 0400) Resp:  [11-24] 11 (08/01 0600) BP: (113-149)/(47-84) 129/83 (08/01 0600) SpO2:  [76 %-99 %] 94 % (08/01 0600) Arterial Line BP: (121-127)/(49-54) 122/54 (07/31 0800) Weight:  [93.1 kg] 93.1 kg (08/01 0500)  Hemodynamic parameters for last 24 hours: CVP:  [5 mmHg-7 mmHg] 7 mmHg CO:  [9.5 L/min-9.9 L/min] 9.5 L/min CI:  [4.7 L/min/m2-4.9 L/min/m2] 4.7 L/min/m2  Intake/Output from previous day: 07/31 0701 - 08/01 0700 In: 462.1 [P.O.:240; I.V.:22.2; IV Piggyback:199.9] Out: 2060 [Urine:1710; Chest Tube:350] Intake/Output this shift: No intake/output data recorded.  General appearance: alert, cooperative, and no distress Neurologic: intact Heart: regular rate and rhythm, no murmur, some PVCs Lungs: diminished bibasilar breath sounds Abdomen: soft, non-tender; bowel sounds normal; no masses,  no organomegaly Extremities: edema 1+ BLE Wound: Clean and dry dressing in place of sternum, EVH site is clean and dry without sign of infection  Lab Results: Recent Labs    10/10/23 1612 10/11/23 0432  WBC 16.1* 15.1*  HGB 11.8* 12.0*  HCT 35.1* 35.6*  PLT 194 204   BMET:  Recent Labs    10/10/23 1612 10/11/23 0432  NA 133* 135  K 3.8 3.6  CL 103 101  CO2 26 25   GLUCOSE 182* 132*  BUN 8 7*  CREATININE 0.92 0.91  CALCIUM  7.7* 7.9*    PT/INR:  Recent Labs    10/09/23 1250  LABPROT 17.5*  INR 1.4*   ABG    Component Value Date/Time   PHART 7.282 (L) 10/09/2023 1611   HCO3 20.4 10/09/2023 1611   TCO2 22 10/09/2023 1611   ACIDBASEDEF 6.0 (H) 10/09/2023 1611   O2SAT 96 10/09/2023 1611   CBG (last 3)  Recent Labs    10/10/23 2028 10/11/23 0009 10/11/23 0351  GLUCAP 150* 136* 121*    Assessment/Plan: S/P Procedure(s) (LRB): CORONARY ARTERY BYPASS GRAFTING (CABG) TIMES TWO USING LEFT INTERNAL MAMMARY ARTERY AND ENDOSCOPICALLY HARVESTED RIGHT GREATER SAPHENOUS VEIN (N/A) ECHOCARDIOGRAM, TRANSESOPHAGEAL (N/A)  Neuro: Pain controlled   CV: NSR, HR 70s-80s with some PVCs. SBP 120s-140s. Continue Lopressor  25mg  BID. Can likely restart Enalapril  tomorrow.    Pulm: CT output 350cc/24hrs. CT without air leak. Leave in place for now. Hx of NSIP, looks like he was prescribed Pirfenidone by [facility] clinic pulmonology but not in patient's home meds and he is unaware of this medication, reports he just takes Advair for this. CXR with bibasilar atelectasis L>R, likely small bilateral pleural effusions and pulmonary congestion. Saturating well on RA this AM. Encourage IS and ambulation. Continue Lasix .    GI: Some nausea this AM no vomiting, improved with zofran . Continue Zofran  and another day of Reglan . Not yet passing gas but tolerating a diet. Good bowel sounds this AM  and no distension. Continue bowel regimen and monitor.    Endo: T2DM preop A1C 6.4. On Metformin  at home. CBGs well controlled on Semglee  5U BID and SSI. As recommended by pharmacy will decrease Semglee  to 5U daily.     Renal: Cr 0.91. K 3.6, supplement. UO 1710cc/24hrs. +7lbs from preop weight. Will give IV Lasix  40mg  today then restart PO Lasix  tomorrow. Patient required in and out cath yesterday and last night. Will start flomax. Get bladder scan. May need to replace foley  catheter.    ID: Likely reactive leukocytosis improving, WBC 15.1 Afebrile. Clinically monitor.    Expected postop: H/H 12/35.6, stable. Not clinically significant at this time.    DVT Prophylaxis: Start Lovenox    Dispo: Transfer to 4E   LOS: 2 days    Con GORMAN Bend, PA-C 10/11/2023  Agree Awaiting floor bed  Macyn Remmert O Bransyn Adami

## 2023-10-11 NOTE — Progress Notes (Signed)
   [address] [place] [address]             [phone]    POD # 2 CABG x 2  Up in chair  BP 138/71 (BP Location: Right Arm)   Pulse 90   Temp 97.6 F (36.4 C) (Axillary)   Resp 12   Ht 5' 8 (1.727 m)   Wt 93.1 kg   SpO2 (!) 89%   BMI 31.21 kg/m    Intake/Output Summary (Last 24 hours) at 10/11/2023 1732 Last data filed at 10/11/2023 1200 Gross per 24 hour  Intake 1160 ml  Output 1835 ml  Net -675 ml   Awaiting bed on 4E  Stephen Turnbaugh C. Kerrin, MD [facility] [phone]

## 2023-10-11 NOTE — Plan of Care (Signed)
°  Problem: Cardiac: °Goal: Will achieve and/or maintain hemodynamic stability °Outcome: Progressing °  °

## 2023-10-12 LAB — GLUCOSE, CAPILLARY
Glucose-Capillary: 104 mg/dL — ABNORMAL HIGH (ref 70–99)
Glucose-Capillary: 132 mg/dL — ABNORMAL HIGH (ref 70–99)
Glucose-Capillary: 122 mg/dL — ABNORMAL HIGH (ref 70–99)
Glucose-Capillary: 134 mg/dL — ABNORMAL HIGH (ref 70–99)

## 2023-10-12 LAB — CBC
HCT: 39.1 % (ref 39.0–52.0)
Hemoglobin: 13.2 g/dL (ref 13.0–17.0)
MCH: 32 pg (ref 26.0–34.0)
MCHC: 33.8 g/dL (ref 30.0–36.0)
MCV: 94.9 fL (ref 80.0–100.0)
Platelets: 248 K/uL (ref 150–400)
RBC: 4.12 MIL/uL — ABNORMAL LOW (ref 4.22–5.81)
RDW: 12.5 % (ref 11.5–15.5)
WBC: 12.4 K/uL — ABNORMAL HIGH (ref 4.0–10.5)
nRBC: 0 % (ref 0.0–0.2)

## 2023-10-12 LAB — BASIC METABOLIC PANEL WITH GFR
Anion gap: 8 (ref 5–15)
BUN: 10 mg/dL (ref 8–23)
CO2: 28 mmol/L (ref 22–32)
Calcium: 8.4 mg/dL — ABNORMAL LOW (ref 8.9–10.3)
Chloride: 103 mmol/L (ref 98–111)
Creatinine, Ser: 1.05 mg/dL (ref 0.61–1.24)
GFR, Estimated: >60 mL/min (ref >60)
Glucose, Bld: 100 mg/dL — ABNORMAL HIGH (ref 70–99)
Potassium: 4 mmol/L (ref 3.5–5.1)
Sodium: 139 mmol/L (ref 135–145)

## 2023-10-12 MED ORDER — ENALAPRIL MALEATE 10 MG PO TABS
10.0000 mg | ORAL_TABLET | Freq: Every day | ORAL | Status: DC
  Administered 2023-10-12: 10 mg via ORAL
  Filled 2023-10-12 (×2): qty 1

## 2023-10-12 MED ORDER — METFORMIN HCL 500 MG PO TABS
500.0000 mg | ORAL_TABLET | Freq: Two times a day (BID) | ORAL | Status: DC
  Administered 2023-10-12 – 2023-10-13 (×2): 500 mg via ORAL
  Filled 2023-10-12 (×2): qty 1

## 2023-10-12 MED ORDER — POTASSIUM CHLORIDE CRYS ER 20 MEQ PO TBCR
20.0000 meq | EXTENDED_RELEASE_TABLET | Freq: Every day | ORAL | Status: DC
  Administered 2023-10-12: 20 meq via ORAL
  Filled 2023-10-12: qty 1

## 2023-10-12 NOTE — Plan of Care (Signed)
   Problem: Education: Goal: Knowledge of General Education information will improve Description Including pain rating scale, medication(s)/side effects and non-pharmacologic comfort measures Outcome: Progressing   Problem: Health Behavior/Discharge Planning: Goal: Ability to manage health-related needs will improve Outcome: Progressing

## 2023-10-12 NOTE — Progress Notes (Addendum)
 3 Days Post-Op Procedure(s) (LRB): CORONARY ARTERY BYPASS GRAFTING (CABG) TIMES TWO USING LEFT INTERNAL MAMMARY ARTERY AND ENDOSCOPICALLY HARVESTED RIGHT GREATER SAPHENOUS VEIN (N/A) ECHOCARDIOGRAM, TRANSESOPHAGEAL (N/A) Subjective: Feels ok, + Small BM, + flatus, voiding well, pain controlled  Objective: Vital signs in last 24 hours: Temp:  [97.6 F (36.4 C)-98.9 F (37.2 C)] 98.3 F (36.8 C) (08/02 0733) Pulse Rate:  [76-91] 91 (08/02 0733) Cardiac Rhythm: Normal sinus rhythm (08/01 2247) Resp:  [12-20] 15 (08/02 0733) BP: (111-140)/(67-73) 139/69 (08/02 0733) SpO2:  [89 %-95 %] 95 % (08/02 0733) Weight:  [92.4 kg] 92.4 kg (08/02 0340)  Hemodynamic parameters for last 24 hours:    Intake/Output from previous day: 08/01 0701 - 08/02 0700 In: 840 [P.O.:840] Out: 260 [Chest Tube:260] Intake/Output this shift: No intake/output data recorded.  General appearance: alert, cooperative, and no distress Heart: regular rate and rhythm Lungs: mildly dim in bases Abdomen: benign Extremities: minor edema Wound: chest dressing in place, EVH site looks good  Lab Results: Recent Labs    10/11/23 0432 10/12/23 0351  WBC 15.1* 12.4*  HGB 12.0* 13.2  HCT 35.6* 39.1  PLT 204 248   BMET:  Recent Labs    10/11/23 0432 10/12/23 0351  NA 135 139  K 3.6 4.0  CL 101 103  CO2 25 28  GLUCOSE 132* 100*  BUN 7* 10  CREATININE 0.91 1.05  CALCIUM  7.9* 8.4*    PT/INR:  Recent Labs    10/09/23 1250  LABPROT 17.5*  INR 1.4*   ABG    Component Value Date/Time   PHART 7.282 (L) 10/09/2023 1611   HCO3 20.4 10/09/2023 1611   TCO2 22 10/09/2023 1611   ACIDBASEDEF 6.0 (H) 10/09/2023 1611   O2SAT 96 10/09/2023 1611   CBG (last 3)  Recent Labs    10/11/23 1542 10/11/23 2059 10/12/23 0544  GLUCAP 144* 132* 104*    Meds Scheduled Meds:  acetaminophen   1,000 mg Oral Q6H   aspirin  EC  325 mg Oral Daily   Or   aspirin   324 mg Per Tube Daily   atorvastatin   40 mg Oral Daily    bisacodyl   10 mg Oral Daily   Or   bisacodyl   10 mg Rectal Daily   docusate sodium   200 mg Oral Daily   enoxaparin  (LOVENOX ) injection  30 mg Subcutaneous QHS   fluticasone  furoate-vilanterol  1 puff Inhalation Daily   furosemide   40 mg Oral Daily   insulin  aspart  0-15 Units Subcutaneous TID WC   insulin  aspart  0-5 Units Subcutaneous QHS   insulin  glargine-yfgn  5 Units Subcutaneous Daily   metoprolol  tartrate  25 mg Oral BID   pantoprazole   40 mg Oral Daily   tamsulosin   0.4 mg Oral Daily   Continuous Infusions:  albumin  human     PRN Meds:.albumin  human, dextrose , metoprolol  tartrate, morphine  injection, ondansetron  (ZOFRAN ) IV, mouth rinse, oxyCODONE , traMADol   Xrays DG Chest Port 1 View Result Date: 10/11/2023 CLINICAL DATA:  Pneumothorax. EXAM: PORTABLE CHEST 1 VIEW COMPARISON:  October 10, 2023. FINDINGS: Stable cardiomediastinal silhouette. Sternotomy wires are noted. Right internal jugular catheter is unchanged. Minimal bibasilar subsegmental atelectasis is noted. Bony thorax is unremarkable. IMPRESSION: Minimal bibasilar subsegmental atelectasis. Electronically Signed   By: Lynwood Landy Raddle M.D.   On: 10/11/2023 08:53    Assessment/Plan: S/P Procedure(s) (LRB): CORONARY ARTERY BYPASS GRAFTING (CABG) TIMES TWO USING LEFT INTERNAL MAMMARY ARTERY AND ENDOSCOPICALLY HARVESTED RIGHT GREATER SAPHENOUS VEIN (N/A) ECHOCARDIOGRAM, TRANSESOPHAGEAL (N/A)  POD#3  1 afeb, VSS, sinus rhythm,occas PVC's, - BP in 130's-140's at time - will resume ACE-I at 1/2 dose for now 2 sats good on RA 3 voiding well, not all measured 4 weight snout 3 kg>preop- cont diuretics 5 BS adeq controlled, on semglee  and SSI- resume glucophage  6 normal renal fxn 7 leukocytosis trending lower, likely reactive 8 not anemic 9 lovenox  for DVT ppx 10 cont pulm hygiene and rehab modalities 11 likely home in am if no new issues   LOS: 3 days      Lemond FORBES Salon Pager [phone] 10/12/2023 Patient  seen and examined, agree with findings and plan outlined above  Elspeth C. Kerrin, MD [facility] [phone]

## 2023-10-12 NOTE — Progress Notes (Signed)
 CARDIAC REHAB PHASE I  Pt walked with mobility. Ed given to pt and spouse. Discussed heart healthy diet, sternal precautions, IS use, wound care, and exercise guidelines. Will refer to [facility]. Pt left in the bed w/ call bell in reach. Discussed family care upon discharge w/ pt. Pt's spouse will be home with him. Will continue to follow.  [phone] Christopher JINNY Moats, MS, ACSM-CEP 10/12/2023 11:06 AM

## 2023-10-12 NOTE — Progress Notes (Signed)
 Mobility Specialist Progress Note;   10/12/23 0957  Mobility  Activity Ambulated independently;Pivoted/transferred from bed to chair  Level of Assistance Standby assist, set-up cues, supervision of patient - no hands on  Assistive Device None  Distance Ambulated (ft) 400 ft  RUE Weight Bearing Per Provider Order NWB  LUE Weight Bearing Per Provider Order NWB  Activity Response Tolerated well  Mobility Referral Yes  Mobility visit 1 Mobility  Mobility Specialist Start Time (ACUTE ONLY) 0957  Mobility Specialist Stop Time (ACUTE ONLY) 1005  Mobility Specialist Time Calculation (min) (ACUTE ONLY) 8 min   Pt eager for mobility. Required no physical assistance during ambulation, SV. HR up to 108 bpm w/ activity. No c/o when asked. Requested to sit in chair at [facility]. Pt left in chair with all needs met, call bell in reach.  Lauraine Erm Mobility Specialist Please contact via SecureChat or [employer] [phone]

## 2023-10-13 LAB — BASIC METABOLIC PANEL WITH GFR
Anion gap: 10 (ref 5–15)
BUN: 11 mg/dL (ref 8–23)
CO2: 25 mmol/L (ref 22–32)
Calcium: 7.9 mg/dL — ABNORMAL LOW (ref 8.9–10.3)
Chloride: 105 mmol/L (ref 98–111)
Creatinine, Ser: 0.86 mg/dL (ref 0.61–1.24)
GFR, Estimated: >60 mL/min (ref >60)
Glucose, Bld: 105 mg/dL — ABNORMAL HIGH (ref 70–99)
Potassium: 3.4 mmol/L — ABNORMAL LOW (ref 3.5–5.1)
Sodium: 140 mmol/L (ref 135–145)

## 2023-10-13 LAB — CBC
HCT: 36.2 % — ABNORMAL LOW (ref 39.0–52.0)
Hemoglobin: 12.3 g/dL — ABNORMAL LOW (ref 13.0–17.0)
MCH: 31.9 pg (ref 26.0–34.0)
MCHC: 34 g/dL (ref 30.0–36.0)
MCV: 93.8 fL (ref 80.0–100.0)
Platelets: 233 K/uL (ref 150–400)
RBC: 3.86 MIL/uL — ABNORMAL LOW (ref 4.22–5.81)
RDW: 12.4 % (ref 11.5–15.5)
WBC: 8.3 K/uL (ref 4.0–10.5)
nRBC: 0 % (ref 0.0–0.2)

## 2023-10-13 LAB — GLUCOSE, CAPILLARY: Glucose-Capillary: 94 mg/dL (ref 70–99)

## 2023-10-13 MED ORDER — TAMSULOSIN HCL 0.4 MG PO CAPS: 0.4000 mg | ORAL_CAPSULE | Freq: Every day | ORAL | 0 refills | Status: DC

## 2023-10-13 MED ORDER — METOPROLOL TARTRATE 50 MG PO TABS: 50.0000 mg | ORAL_TABLET | Freq: Two times a day (BID) | ORAL | 1 refills | Status: DC

## 2023-10-13 MED ORDER — METOPROLOL TARTRATE 50 MG PO TABS
50.0000 mg | ORAL_TABLET | Freq: Two times a day (BID) | ORAL | Status: DC
  Administered 2023-10-13: 50 mg via ORAL
  Filled 2023-10-13: qty 1

## 2023-10-13 MED ORDER — ASPIRIN 325 MG PO TBEC: 325.0000 mg | DELAYED_RELEASE_TABLET | Freq: Every day | ORAL | Status: DC

## 2023-10-13 MED ORDER — POTASSIUM CHLORIDE CRYS ER 20 MEQ PO TBCR: 40.0000 meq | EXTENDED_RELEASE_TABLET | Freq: Every day | ORAL | 0 refills | Status: DC

## 2023-10-13 MED ORDER — ACETAMINOPHEN 325 MG PO TABS: 650.0000 mg | ORAL_TABLET | Freq: Four times a day (QID) | ORAL | Status: AC | PRN

## 2023-10-13 MED ORDER — FUROSEMIDE 40 MG PO TABS: 40.0000 mg | ORAL_TABLET | Freq: Every day | ORAL | 0 refills | Status: DC

## 2023-10-13 MED ORDER — OXYCODONE HCL 5 MG PO TABS: 5.0000 mg | ORAL_TABLET | Freq: Four times a day (QID) | ORAL | 0 refills | Status: AC | PRN

## 2023-10-13 MED ORDER — POTASSIUM CHLORIDE CRYS ER 20 MEQ PO TBCR
40.0000 meq | EXTENDED_RELEASE_TABLET | Freq: Every day | ORAL | Status: DC
  Administered 2023-10-13: 40 meq via ORAL
  Filled 2023-10-13: qty 2

## 2023-10-13 MED ORDER — ENALAPRIL MALEATE 10 MG PO TABS
20.0000 mg | ORAL_TABLET | Freq: Every day | ORAL | Status: DC
  Administered 2023-10-13: 20 mg via ORAL
  Filled 2023-10-13: qty 2

## 2023-10-13 NOTE — Progress Notes (Addendum)
 4 Days Post-Op Procedure(s) (LRB): CORONARY ARTERY BYPASS GRAFTING (CABG) TIMES TWO USING LEFT INTERNAL MAMMARY ARTERY AND ENDOSCOPICALLY HARVESTED RIGHT GREATER SAPHENOUS VEIN (N/A) ECHOCARDIOGRAM, TRANSESOPHAGEAL (N/A) Subjective: Feels well, no issues  Objective: Vital signs in last 24 hours: Temp:  [97.9 F (36.6 C)-98.5 F (36.9 C)] 97.9 F (36.6 C) (08/03 0247) Pulse Rate:  [88-102] 89 (08/03 0247) Cardiac Rhythm: Sinus tachycardia (08/02 1900) Resp:  [16-20] 16 (08/03 0247) BP: (132-147)/(68-79) 132/71 (08/03 0247) SpO2:  [93 %-96 %] 96 % (08/03 0247) Weight:  [91.3 kg] 91.3 kg (08/03 0247)  Hemodynamic parameters for last 24 hours:    Intake/Output from previous day: 08/02 0701 - 08/03 0700 In: 120 [P.O.:120] Out: -  Intake/Output this shift: No intake/output data recorded.  General appearance: alert, cooperative, and no distress Heart: regular rate and rhythm Lungs: clear to auscultation bilaterally Abdomen: benign Extremities: no edema Wound: incis healing well  Lab Results: Recent Labs    10/12/23 0351 10/13/23 0320  WBC 12.4* 8.3  HGB 13.2 12.3*  HCT 39.1 36.2*  PLT 248 233   BMET:  Recent Labs    10/12/23 0351 10/13/23 0320  NA 139 140  K 4.0 3.4*  CL 103 105  CO2 28 25  GLUCOSE 100* 105*  BUN 10 11  CREATININE 1.05 0.86  CALCIUM  8.4* 7.9*    PT/INR: No results for input(s): LABPROT, INR in the last 72 hours. ABG    Component Value Date/Time   PHART 7.282 (L) 10/09/2023 1611   HCO3 20.4 10/09/2023 1611   TCO2 22 10/09/2023 1611   ACIDBASEDEF 6.0 (H) 10/09/2023 1611   O2SAT 96 10/09/2023 1611   CBG (last 3)  Recent Labs    10/12/23 1721 10/12/23 2056 10/13/23 0557  GLUCAP 122* 134* 94    Meds Scheduled Meds:  acetaminophen   1,000 mg Oral Q6H   aspirin  EC  325 mg Oral Daily   Or   aspirin   324 mg Per Tube Daily   atorvastatin   40 mg Oral Daily   bisacodyl   10 mg Oral Daily   Or   bisacodyl   10 mg Rectal Daily    docusate sodium   200 mg Oral Daily   enalapril   10 mg Oral Daily   enoxaparin  (LOVENOX ) injection  30 mg Subcutaneous QHS   fluticasone  furoate-vilanterol  1 puff Inhalation Daily   furosemide   40 mg Oral Daily   insulin  aspart  0-15 Units Subcutaneous TID WC   insulin  aspart  0-5 Units Subcutaneous QHS   insulin  glargine-yfgn  5 Units Subcutaneous Daily   metFORMIN   500 mg Oral BID WC   metoprolol  tartrate  25 mg Oral BID   pantoprazole   40 mg Oral Daily   potassium chloride   20 mEq Oral Daily   tamsulosin   0.4 mg Oral Daily   Continuous Infusions:  albumin  human     PRN Meds:.albumin  human, dextrose , metoprolol  tartrate, morphine  injection, ondansetron  (ZOFRAN ) IV, mouth rinse, oxyCODONE , traMADol   Xrays No results found.  Assessment/Plan: S/P Procedure(s) (LRB): CORONARY ARTERY BYPASS GRAFTING (CABG) TIMES TWO USING LEFT INTERNAL MAMMARY ARTERY AND ENDOSCOPICALLY HARVESTED RIGHT GREATER SAPHENOUS VEIN (N/A) ECHOCARDIOGRAM, TRANSESOPHAGEAL (N/A) POD#4  1 afeb, S BP 130's-140's, sinus rhythm/tachy(80's-low 100's), + PVC's- increase ace-I to home dose, increase beta blocker, normal LV fxn on most recent echo 2 O2 sats good on RA 3 voiding and stooling, weight about 2 kg>preop, cont diuretic short term 4 K+ 3.4, replace 5 normal renal fxn 6 leukocytosis resolved, minor expected ABLA-  no tx required 7 lovenox  for DVT ppx 8 BS good control on metformin , SSI 9 appears stable for D/C   LOS: 4 days    Lemond FORBES Cera PA-C Pager [phone] 10/13/2023 Patient seen and examined, agree with above Looks great Home today  Deondra Wigger. Kerrin, MD [facility] [phone]

## 2023-10-14 ENCOUNTER — Telehealth: Payer: Self-pay | Admitting: *Deleted

## 2023-10-14 NOTE — Transitions of Care (Post Inpatient/ED Visit) (Signed)
 10/14/2023  Name: Christopher Oconnell MRN: [id number] DOB: 1950-09-20  Today's TOC FU Call Status: Today's TOC FU Call Status:: Successful TOC FU Call Completed TOC FU Call Complete Date: 10/14/23 Patient's Name and Date of Birth confirmed.  Transition Care Management Follow-up Telephone Call Date of Discharge: 10/13/23 Discharge Facility: [facility]) Type of Discharge: Inpatient Admission Primary Inpatient Discharge Diagnosis:: S/P CABG (coronary artery bypass graft How have you been since you were released from the hospital?: Better Any questions or concerns?: No  Items Reviewed: Did you receive and understand the discharge instructions provided?: Yes Medications obtained,verified, and reconciled?: Yes (Medications Reviewed) Any new allergies since your discharge?: No Dietary orders reviewed?: No Do you have support at home?: Yes People in Home [RPT]: spouse Name of Support/Comfort Primary Source: [name]  Medications Reviewed Today: Medications Reviewed Today     Reviewed by Kennieth Cathlean DEL, RN (Case Manager) on 10/14/23 at 1510  Med List Status: <None>   Medication Order Taking? Sig Documenting Provider Last Dose Status Informant  acetaminophen  (TYLENOL ) 325 MG tablet [id number] Yes Take 2 tablets (650 mg total) by mouth every 6 (six) hours as needed for mild pain (pain score 1-3). Gold, Wayne E, PA-C  Active   aspirin  EC 325 MG tablet [id number] Yes Take 1 tablet (325 mg total) by mouth daily. Gold, Wayne E, PA-C  Active   atorvastatin  (LIPITOR) 40 MG tablet [id number] Yes TAKE 1 TABLET BY MOUTH EVERY DAY Wendee Lynwood HERO, NP  Active Self, Pharmacy Records  enalapril  (VASOTEC ) 20 MG tablet [id number] Yes TAKE 1 TABLET BY MOUTH EVERY DAY Wendee Lynwood HERO, NP  Active Self, Pharmacy Records           Med Note Powell, MORGAN N   Wed Oct 09, 2023  6:09 AM) Pt held 2 days pre-op.  fluticasone -salmeterol (ADVAIR) 250-50 MCG/ACT AEPB [id number] Yes Inhale 1 puff into the lungs in  the morning and at bedtime. [provider]  Active Self, Pharmacy Records  furosemide  (LASIX ) 40 MG tablet [id number] Yes Take 1 tablet (40 mg total) by mouth daily. Gold, Wayne E, PA-C  Active   Lancets (FREESTYLE) lancets [id number] Yes Use 1 lancet as directed fingerstick Once a day to test blood sugar for Dx: E11.9 [provider]  Active Self, Pharmacy Records  metFORMIN  (GLUCOPHAGE ) 500 MG tablet [id number] Yes TAKE 1 TABLET BY MOUTH TWICE A DAY WITH FOOD Wendee Lynwood HERO, NP  Active Self, Pharmacy Records  metoprolol  tartrate (LOPRESSOR ) 50 MG tablet [id number] Yes Take 1 tablet (50 mg total) by mouth 2 (two) times daily. Gold, Wayne E, PA-C  Active   Omega-3 Fatty Acids (FISH OIL PO) [id number] Yes Take 1 capsule by mouth daily. [provider]  Active Self, Pharmacy Records  oxyCODONE  (OXY IR/ROXICODONE ) 5 MG immediate release tablet [id number] Yes Take 1 tablet (5 mg total) by mouth every 6 (six) hours as needed for up to 7 days for severe pain (pain score 7-10). Gold, Wayne E, PA-C  Active   pantoprazole  (PROTONIX ) 40 MG tablet [id number] Yes Take 40 mg by mouth daily as needed (heart burn). [provider]  Active Self, Pharmacy Records  potassium chloride  SA (KLOR-CON  M) 20 MEQ tablet [id number] Yes Take 2 tablets (40 mEq total) by mouth daily. Gold, Wayne E, PA-C  Active   Potassium Gluconate 550 MG TABS [id number] Yes Take 1 tablet by mouth 2 (two) times a week. [provider]  Active Self, Pharmacy Records  tamsulosin  (FLOMAX ) 0.4  MG CAPS capsule [id number] Yes Take 1 capsule (0.4 mg total) by mouth daily. Gold, Wayne E, PA-C  Active   Turmeric (QC TUMERIC COMPLEX PO) [id number] Yes Take 1 capsule by mouth daily. [provider]  Active Self, Pharmacy Records            Home Care and Equipment/Supplies: Were Home Health Services Ordered?: NA Any new equipment or medical supplies ordered?: NA  Functional Questionnaire: Do you need  assistance with bathing/showering or dressing?: No Do you need assistance with meal preparation?: Yes Do you need assistance with eating?: No Do you have difficulty maintaining continence: No Do you need assistance with getting out of bed/getting out of a chair/moving?: No Do you have difficulty managing or taking your medications?: No  Follow up appointments reviewed: PCP Follow-up appointment confirmed?: No MD Provider Line Number:[phone] Given: Yes (Patient is aware that his appointment with PCP is 12/25/2023  and he declined to make an earlier appointment) Specialist Hospital Follow-up appointment confirmed?: Yes Date of Specialist follow-up appointment?: 10/25/23 Follow-Up Specialty Provider:: [id number] Harrell Lightfoot/ [id number] Dr Loistine Do you need transportation to your follow-up appointment?: No Do you understand care options if your condition(s) worsen?: Yes-patient verbalized understanding  SDOH Interventions Today    Flowsheet Row Most Recent Value  SDOH Interventions   Food Insecurity Interventions Intervention Not Indicated  Housing Interventions Intervention Not Indicated  Transportation Interventions Intervention Not Indicated, Patient Resources (Friends/Family)  Utilities Interventions Intervention Not Indicated   Cathlean Headland BSN RN [facility] Health Care Management Coordinator [email or web address] Direct Dial: [phone]  Fax: [phone] Website: [email or web address]

## 2023-10-14 NOTE — Transitions of Care (Post Inpatient/ED Visit) (Signed)
   10/14/2023  Name: Christopher Oconnell MRN: [id number] DOB: 01-21-1951  Today's TOC FU Call Status: Today's TOC FU Call Status:: Unsuccessful Call (1st Attempt) Unsuccessful Call (1st Attempt) Date: 10/14/23  Attempted to reach the patient regarding the most recent Inpatient/ED visit.  Follow Up Plan: Additional outreach attempts will be made to reach the patient to complete the Transitions of Care (Post Inpatient/ED visit) call.   Cathlean Headland BSN RN [facility] Health Care Management Coordinator [email or web address] Direct Dial: [phone]  Fax: [phone] Website: [email or web address]

## 2023-10-15 MED FILL — Mannitol IV Soln 20%: INTRAVENOUS | Qty: 500 | Status: AC

## 2023-10-15 MED FILL — Sodium Bicarbonate IV Soln 8.4%: INTRAVENOUS | Qty: 50 | Status: AC

## 2023-10-15 MED FILL — Electrolyte-R (PH 7.4) Solution: INTRAVENOUS | Qty: 4000 | Status: AC

## 2023-10-15 MED FILL — Calcium Chloride Inj 10%: INTRAVENOUS | Qty: 10 | Status: AC

## 2023-10-15 MED FILL — Heparin Sodium (Porcine) Inj 1000 Unit/ML: INTRAMUSCULAR | Qty: 30 | Status: AC

## 2023-10-15 MED FILL — Heparin Sodium (Porcine) Inj 1000 Unit/ML: INTRAMUSCULAR | Qty: 10 | Status: AC

## 2023-10-15 MED FILL — Sodium Chloride IV Soln 0.9%: INTRAVENOUS | Qty: 2000 | Status: AC

## 2023-10-22 ENCOUNTER — Telehealth: Payer: Self-pay

## 2023-10-22 NOTE — Telephone Encounter (Signed)
 Patient contacted the office about a hard lump at the very top of his sternal incision. He states that it is not red or tender, no pain. He is s/p CABG with Dr. Shyrl 10/09/23. Advised that this is normal post sternotomy. He acknowledged receipt. He is aware of his f/u phone call with Dr. Shyrl on Friday.

## 2023-10-25 ENCOUNTER — Ambulatory Visit
Attending: Thoracic Surgery (Cardiothoracic Vascular Surgery) | Admitting: Thoracic Surgery (Cardiothoracic Vascular Surgery)

## 2023-10-25 DIAGNOSIS — Z951 Presence of aortocoronary bypass graft: Secondary | ICD-10-CM

## 2023-10-25 NOTE — Progress Notes (Signed)
     [address]       [name] [address]             [phone]       Patient: Home Provider: Office Consent for Telemedicine visit obtained.  Today's visit was completed via a real-time telehealth (see specific modality noted below). The patient/authorized person provided oral consent at the time of the visit to engage in a telemedicine encounter with the present provider at [facility]. The patient/authorized person was informed of the potential benefits, limitations, and risks of telemedicine. The patient/authorized person expressed understanding that the laws that protect confidentiality also apply to telemedicine. The patient/authorized person acknowledged understanding that telemedicine does not provide emergency services and that he or she would need to call 911 or proceed to the nearest hospital for help if such a need arose.   Total time spent in the clinical discussion 10 minutes.  Telehealth Modality: Phone visit (audio only)  I had a telephone visit with  Elspeth JAYSON Messing who is s/p CABG.  Overall doing well.  Pain is minimal.  Ambulating well. Vitals have been stable.  Blayne Frankie Ponti will see us  back in 1 month with a chest x-ray for cardiac rehab clearance.  Audrena Talaga MALVA Rayas

## 2023-10-26 NOTE — Progress Notes (Unsigned)
 Cardiology Clinic Note   Date: 10/30/2023 ID: Wilbon, Obenchain 10-Jun-1950, MRN [id number]  Primary Cardiologist:  Lonni Hanson, MD  Chief Complaint   Christopher Oconnell is a 73 y.o. male who presents to the clinic today for hospital follow up.   Patient Profile   Christopher Oconnell is followed by Dr. Hanson for the history outlined below.      Past medical history significant for: CAD. PTCA to D1 1993. Echo 08/26/2023: EF 60 to 65%.  No RWMA.  Grade I DD.  Normal RV size/function.  Mild MR.  Aortic valve sclerosis/calcification without stenosis. LHC 09/02/2023 (abnormal stress test): Severe two-vessel CAD with mid RCA 99% and ostial LCx 90%.  There is also moderate diffuse LMCA and LAD disease with heavy calcification.  Mildly elevated LVEDP.  Recommendation to increase isosorbide  and review images with heart team. CABG x 2 10/09/2023: LIMA to OM, RSVG to PDA. Hypertension. Hyperlipidemia. Lipid panel 05/30/2023: LDL 64, HDL 39, TG 115, total 125. CVA. Interstitial lung disease. T2DM.  In summary, patient was previously followed by Dr. Blanca.  He transitioned his care to Dr. Hanson in December 2020.  At office visit in April 2025 he noted progression of longstanding exertional dyspnea with associated chest tightness.  He had previous complaints of lightheadedness that resolved after discontinuing amlodipine  in the setting of soft BP.  EKG showed sinus rhythm with PACs and T wave abnormality.  Cardiac PET/CT in June 2025 was a high risk study demonstrating a medium defect with moderate reduction in uptake present in the apical to basal anterior and anterior lateral locations that was partially reversible with normal wall motion consistent with ischemia.  There was a second defect present in the apical to basal inferior location that was reversible with abnormal wall motion in the defect area consistent with ischemia.  Echo showed normal LV/RV function.  LHC demonstrated severe  two-vessel CAD.  Patient underwent CABG x 2 in July.     History of Present Illness    Today, patient is accompanied by his wife. He is doing well. Patient denies shortness of breath, dyspnea on exertion, lower extremity edema, orthopnea or PND. No chest pain, pressure, or tightness. No palpitations.  Sternal incision is healing well. He noticed a puffy area at the top of the incision that is soft to touch. He has mild tenderness to palpation across his upper chest near the incision. No drainage. He is tolerating walking without difficulty. He does report mild fatigue needing to take naps in the afternoon. He is hoping to come off some of his medications eventually.     ROS: All other systems reviewed and are otherwise negative except as noted in History of Present Illness.  EKGs/Labs Reviewed    EKG Interpretation Date/Time:  Wednesday October 30 2023 09:54:31 EDT Ventricular Rate:  64 PR Interval:  158 QRS Duration:  86 QT Interval:  380 QTC Calculation: 392 R Axis:   8  Text Interpretation: Normal sinus rhythm T wave abnormality When compared with ECG of 10-Oct-2023 07:00, QT has shortened Confirmed by Loistine Sober [phone]) on 10/30/2023 9:59:13 AM   10/07/2023: ALT 16; AST 17 10/13/2023: BUN 11; Creatinine, Ser 0.86; Potassium 3.4; Sodium 140   10/13/2023: Hemoglobin 12.3; WBC 8.3   05/30/2023: TSH 1.18    Physical Exam    VS:  BP 120/60 (BP Location: Left Arm, Patient Position: Sitting, Cuff Size: Normal)   Pulse 64   Ht 5' 8 (1.727 m)  Wt 194 lb 6 oz (88.2 kg)   SpO2 96%   BMI 29.55 kg/m  , BMI Body mass index is 29.55 kg/m.  GEN: Well nourished, well developed, in no acute distress. Neck: No JVD or carotid bruits. Cardiac:  RRR.  No murmur. No rubs or gallops.   Respiratory:  Respirations regular and unlabored. Clear to auscultation without rales, wheezing or rhonchi. GI: Soft, nontender, nondistended. Extremities: Radials/DP/PT 2+ and equal bilaterally. No  clubbing or cyanosis. No edema.  Skin: Warm and dry, no rash. Sternal incision is well approximated without erythema or drainage. Neuro: Strength intact.  Assessment & Plan   CAD S/p PTCA to D1 1993, CABG x 2 10/09/2023.  Patient denies chest pain, pressure or tightness. He has mild tenderness to palpation across top of his chest near incision. Sternal incision is well approximated without erythema or drainage. He is tolerating walking well. He is interested in cardiac rehab. He reports increased fatigue and needing to take naps in the afternoon. He is concerned about the side effects of metoprolol  particularly related to decreasing his heart rate. He is euvolemic.  - Decrease metoprolol  to 25 mg bid.  - Continue aspirin , atorvastatin . - CBC and BMP today.   Hypertension BP today 120/60. No dizziness or headaches reported.  - Continue enalapril , metoprolol .  Hyperlipidemia LDL 64 March 2025, at goal. - Continue atorvastatin .  Disposition:  Decrease metoprolol  to 25 mg bid. CBC and BMP today. Return in 3 months or sooner as needed.     Cardiac Rehabilitation Eligibility Assessment  The patient is ready to start cardiac rehabilitation pending clearance from the cardiac surgeon.        Signed, Christopher Oconnell. Laren Orama, DNP, NP-C

## 2023-10-30 ENCOUNTER — Encounter: Payer: Self-pay | Admitting: Student

## 2023-10-30 ENCOUNTER — Ambulatory Visit: Attending: Student | Admitting: Student

## 2023-10-30 VITALS — BP 120/60 | HR 64 | Ht 68.0 in | Wt 194.4 lb

## 2023-10-30 DIAGNOSIS — I2510 Atherosclerotic heart disease of native coronary artery without angina pectoris: Secondary | ICD-10-CM

## 2023-10-30 DIAGNOSIS — Z951 Presence of aortocoronary bypass graft: Secondary | ICD-10-CM

## 2023-10-30 DIAGNOSIS — Z79899 Other long term (current) drug therapy: Secondary | ICD-10-CM | POA: Diagnosis not present

## 2023-10-30 DIAGNOSIS — E785 Hyperlipidemia, unspecified: Secondary | ICD-10-CM | POA: Diagnosis not present

## 2023-10-30 DIAGNOSIS — I10 Essential (primary) hypertension: Secondary | ICD-10-CM

## 2023-10-30 MED ORDER — METOPROLOL TARTRATE 25 MG PO TABS: 50.0000 mg | ORAL_TABLET | Freq: Two times a day (BID) | ORAL | Status: DC

## 2023-10-30 NOTE — Patient Instructions (Signed)
 Medication Instructions:  Your physician recommends the following medication changes.  DECREASE: Metoprolol  Tartrate 25 mg two times daily  *If you need a refill on your cardiac medications before your next appointment, please call your pharmacy*  Lab Work: Your provider would like for you to have following labs drawn today BMet, CBC.   If you have labs (blood work) drawn today and your tests are completely normal, you will receive your results only by: MyChart Message (if you have MyChart) OR A paper copy in the mail If you have any lab test that is abnormal or we need to change your treatment, we will call you to review the results.  Testing/Procedures: None ordered at this time   Follow-Up: At [facility], you and your health needs are our priority.  As part of our continuing mission to provide you with exceptional heart care, our providers are all part of one team.  This team includes your primary Cardiologist (physician) and Advanced Practice Providers or APPs (Physician Assistants and Nurse Practitioners) who all work together to provide you with the care you need, when you need it.  Your next appointment:   3 month(s)  Provider:   Lonni Hanson, MD or Barnie Hila, NP    We recommend signing up for the patient portal called MyChart.  Sign up information is provided on this After Visit Summary.  MyChart is used to connect with patients for Virtual Visits (Telemedicine).  Patients are able to view lab/test results, encounter notes, upcoming appointments, etc.  Non-urgent messages can be sent to your provider as well.   To learn more about what you can do with MyChart, go to [email or web address].

## 2023-10-31 ENCOUNTER — Ambulatory Visit: Payer: Self-pay | Admitting: Student

## 2023-10-31 LAB — BASIC METABOLIC PANEL WITH GFR
BUN/Creatinine Ratio: 15 (ref 10–24)
BUN: 14 mg/dL (ref 8–27)
CO2: 21 mmol/L (ref 20–29)
Calcium: 9.8 mg/dL (ref 8.6–10.2)
Chloride: 102 mmol/L (ref 96–106)
Creatinine, Ser: 0.96 mg/dL (ref 0.76–1.27)
Glucose: 149 mg/dL — ABNORMAL HIGH (ref 70–99)
Potassium: 4.7 mmol/L (ref 3.5–5.2)
Sodium: 140 mmol/L (ref 134–144)
eGFR: 83 mL/min/1.73 (ref >59)

## 2023-10-31 LAB — CBC
Hematocrit: 40.9 % (ref 37.5–51.0)
Hemoglobin: 13.1 g/dL (ref 13.0–17.7)
MCH: 31.4 pg (ref 26.6–33.0)
MCHC: 32 g/dL (ref 31.5–35.7)
MCV: 98 fL — ABNORMAL HIGH (ref 79–97)
Platelets: 385 x10E3/uL (ref 150–450)
RBC: 4.17 x10E6/uL (ref 4.14–5.80)
RDW: 12.3 % (ref 11.6–15.4)
WBC: 9 x10E3/uL (ref 3.4–10.8)

## 2023-10-31 NOTE — Progress Notes (Signed)
 Last read by Elspeth JAYSON Doroteo Marcey at 9:18AM on 10/31/2023.

## 2023-11-14 ENCOUNTER — Ambulatory Visit: Payer: Self-pay | Admitting: Podiatry

## 2023-11-14 DIAGNOSIS — Q666 Other congenital valgus deformities of feet: Secondary | ICD-10-CM | POA: Diagnosis not present

## 2023-11-14 DIAGNOSIS — Q828 Other specified congenital malformations of skin: Secondary | ICD-10-CM | POA: Diagnosis not present

## 2023-11-14 NOTE — Progress Notes (Signed)
 Subjective:  Patient ID: Christopher Oconnell, male    DOB: 07-03-1950,  MRN: [id number]  Chief Complaint  Patient presents with   Callouses    Pt stated that he is has this place on the side of his toe causing him some discomfort     73 y.o. male presents with the above complaint. Patient presents bilateral hallux IPJ hyperkeratotic lesion porokeratotic lesion painful to touch is progressive gotten worse worse with ambulation worse with pressure she would like to have it debrided down he has not seen and was prior to seeing me he is a diabetic with controlled A1c.  He denies any other acute complaints he would also like to get orthotics as well to help with the hyperkeratotic lesion he states understanding   Review of Systems: Negative except as noted in the HPI. Denies N/V/F/Ch.  Past Medical History:  Diagnosis Date   Acne    Arthritis    Asthma    Atypical mole 10/19/2020   R med bicep, mod to severe, exc 11/08/20   Basal cell carcinoma 11/05/2019   Right ear above crus. Nodular and infiltrative patterns. Excised 01/19/2020, margins free.   Coronary artery disease    Diabetes mellitus without complication (HCC)    Dysplastic nevus 11/08/2020   left med bicep at junction of L deltoid, moderate atypia   Dysplastic nevus 09/17/2023   R mid to low back - moderate   Dysplastic nevus 09/17/2023   R sup scapula medial - moderate   GERD (gastroesophageal reflux disease)    Hyperlipidemia    Hypertension    ILD (interstitial lung disease) (HCC)    NSIP   Skin cancer     Current Outpatient Medications:    acetaminophen  (TYLENOL ) 325 MG tablet, Take 2 tablets (650 mg total) by mouth every 6 (six) hours as needed for mild pain (pain score 1-3)., Disp: , Rfl:    aspirin  EC 325 MG tablet, Take 1 tablet (325 mg total) by mouth daily., Disp: , Rfl:    atorvastatin  (LIPITOR) 40 MG tablet, TAKE 1 TABLET BY MOUTH EVERY DAY, Disp: 90 tablet, Rfl: 3   enalapril  (VASOTEC ) 20 MG tablet, TAKE 1  TABLET BY MOUTH EVERY DAY, Disp: 90 tablet, Rfl: 3   fluticasone -salmeterol (ADVAIR) 250-50 MCG/ACT AEPB, Inhale 1 puff into the lungs in the morning and at bedtime., Disp: , Rfl:    Lancets (FREESTYLE) lancets, Use 1 lancet as directed fingerstick Once a day to test blood sugar for Dx: E11.9, Disp: , Rfl:    metFORMIN  (GLUCOPHAGE ) 500 MG tablet, TAKE 1 TABLET BY MOUTH TWICE A DAY WITH FOOD, Disp: 180 tablet, Rfl: 1   metoprolol  tartrate (LOPRESSOR ) 25 MG tablet, Take 2 tablets (50 mg total) by mouth 2 (two) times daily., Disp: , Rfl:    Omega-3 Fatty Acids (FISH OIL PO), Take 1 capsule by mouth daily., Disp: , Rfl:    pantoprazole  (PROTONIX ) 40 MG tablet, Take 40 mg by mouth daily as needed (heart burn)., Disp: , Rfl:    potassium chloride  SA (KLOR-CON  M) 20 MEQ tablet, Take 2 tablets (40 mEq total) by mouth daily., Disp: 3 tablet, Rfl: 0   Potassium Gluconate 550 MG TABS, Take 1 tablet by mouth 2 (two) times a week., Disp: , Rfl:    Turmeric (QC TUMERIC COMPLEX PO), Take 1 capsule by mouth daily., Disp: , Rfl:   Social History   Tobacco Use  Smoking Status Former   Current packs/day: 0.00   Average packs/day:  3.5 packs/day for 30.0 years (105.0 ttl pk-yrs)   Types: Cigarettes   Start date: 66   Quit date: 39   Years since quitting: 32.7  Smokeless Tobacco Never    No Known Allergies Objective:  There were no vitals filed for this visit. There is no height or weight on file to calculate BMI. Constitutional Well developed. Well nourished.  Vascular Dorsalis pedis pulses palpable bilaterally. Posterior tibial pulses palpable bilaterally. Capillary refill normal to all digits.  No cyanosis or clubbing noted. Pedal hair growth normal.  Neurologic Normal speech. Oriented to person, place, and time. Epicritic sensation to light touch grossly present bilaterally.  Dermatologic Bilateral hallux IPJ hyperkeratotic lesion with central nucleated core noted pain on palpation to the  lesion.  Pinch callus noted.  Limited range of motion noted of the first metatarsophalangeal joint  Orthopedic: Normal joint ROM without pain or crepitus bilaterally. No visible deformities. No bony tenderness.   Radiographs: None Assessment:   1. Porokeratosis   2. Pes planovalgus     Plan:  Patient was evaluated and treated and all questions answered.  Bilateral hallux porokeratosis IPJ -All questions and concerns were discussed with the patient in extensive detail given the amount of callus buildup there is patient would benefit from debridement of the lesion using chisel blade handle the lesion was debrided down to healthy dry tissue.  No complication noted no pinpoint bleeding noted.  No ulceration noted. -I discussed shoe gear modification.  Pes planovalgus -I explained to patient the etiology of pes planovalgus and relationship with Planter fasciitis and various treatment options were discussed.  Given patient foot structure in the setting of Planter fasciitis I believe patient will benefit from custom-made orthotics to help control the hindfoot motion support the arch of the foot and take the stress away from plantar fascial.  Patient agrees with the plan like to proceed with orthotics -Patient was casted for orthotics with [facility]   No follow-ups on file.

## 2023-11-21 ENCOUNTER — Other Ambulatory Visit: Payer: Self-pay | Admitting: Thoracic Surgery (Cardiothoracic Vascular Surgery)

## 2023-11-21 DIAGNOSIS — Z951 Presence of aortocoronary bypass graft: Secondary | ICD-10-CM

## 2023-11-22 ENCOUNTER — Ambulatory Visit (HOSPITAL_COMMUNITY)
Admission: RE | Admit: 2023-11-22 | Discharge: 2023-11-22 | Disposition: A | Discharge status: Home / Self Care | Source: Ambulatory Visit | Attending: Cardiology | Admitting: Cardiology

## 2023-11-22 ENCOUNTER — Ambulatory Visit: Payer: Self-pay

## 2023-11-22 VITALS — BP 127/76 | HR 65 | Resp 18 | Ht 68.0 in | Wt 197.0 lb

## 2023-11-22 DIAGNOSIS — Z951 Presence of aortocoronary bypass graft: Secondary | ICD-10-CM

## 2023-11-22 DIAGNOSIS — Z48812 Encounter for surgical aftercare following surgery on the circulatory system: Secondary | ICD-10-CM | POA: Diagnosis not present

## 2023-11-22 NOTE — Patient Instructions (Signed)

## 2023-11-22 NOTE — Progress Notes (Signed)
 [address] [place] [address]             [phone]       HPI:  [name] is a 73 year old man with medical history of hypertension, CAD< interstitial lung disease, type 2 DM, and hyperlipidemia who returns for routine postoperative follow-up having undergone CABG X 2, LIMA to OM, RSVG to PDA and endoscopic greater saphenous vein harvest on the right on 10/09/2023 with Dr. Shyrl.  The patient's early postoperative recovery while in the hospital was notable for having some urinary retention and he was started on flomax . He had some hypertension and was started on beta blocker and home ACE was restarted.  He was stable for discharge home on 10/13/2023.  Since hospital discharge the patient reports that he has been doing well.  He is still having some incisional chest pain which he has been using tylenol  for.  He has been walking one mile every day for exercise.  He denies chest pain, shortness of breath and lower leg swelling.    Allergies as of 11/22/2023   No Known Allergies      Medication List        Accurate as of November 22, 2023  2:35 PM. If you have any questions, ask your nurse or doctor.          acetaminophen  325 MG tablet Commonly known as: Tylenol  Take 2 tablets (650 mg total) by mouth every 6 (six) hours as needed for mild pain (pain score 1-3).   aspirin  EC 325 MG tablet Take 1 tablet (325 mg total) by mouth daily.   atorvastatin  40 MG tablet Commonly known as: LIPITOR TAKE 1 TABLET BY MOUTH EVERY DAY   enalapril  20 MG tablet Commonly known as: VASOTEC  TAKE 1 TABLET BY MOUTH EVERY DAY   FISH OIL PO Take 1 capsule by mouth daily.   fluticasone -salmeterol 250-50 MCG/ACT Aepb Commonly known as: ADVAIR Inhale 1 puff into the lungs in the morning and at bedtime.   freestyle lancets Use 1 lancet as directed fingerstick Once a day to test blood sugar for Dx: E11.9   metFORMIN  500 MG tablet Commonly  known as: GLUCOPHAGE  TAKE 1 TABLET BY MOUTH TWICE A DAY WITH FOOD   metoprolol  tartrate 25 MG tablet Commonly known as: LOPRESSOR  Take 2 tablets (50 mg total) by mouth 2 (two) times daily. What changed: how much to take   pantoprazole  40 MG tablet Commonly known as: PROTONIX  Take 40 mg by mouth daily as needed (heart burn).   potassium chloride  SA 20 MEQ tablet Commonly known as: KLOR-CON  M Take 2 tablets (40 mEq total) by mouth daily.   Potassium Gluconate 550 MG Tabs Take 1 tablet by mouth 2 (two) times a week.   QC TUMERIC COMPLEX PO Take 1 capsule by mouth daily.         ROS Review of Systems  Constitutional: Negative.  Negative for fever and malaise/fatigue.  Respiratory: Negative.  Negative for cough, shortness of breath and wheezing.   Cardiovascular: Negative.  Negative for chest pain, palpitations and leg swelling.  Neurological: Negative.  Negative for dizziness and headaches.      BP 127/76 (BP Location: Left Arm)   Pulse 65   Resp 18   Ht 5' 8 (1.727 m)   Wt 197 lb (89.4 kg)   SpO2 94%   BMI 29.95 kg/m   Physical Exam Constitutional:  Appearance: Normal appearance.  HENT:     Head: Normocephalic and atraumatic.  Cardiovascular:     Rate and Rhythm: Normal rate and regular rhythm.     Heart sounds: Normal heart sounds, S1 normal and S2 normal.  Pulmonary:     Effort: Pulmonary effort is normal.     Breath sounds: Normal breath sounds.  Skin:    General: Skin is warm and dry.      Neurological:     General: No focal deficit present.     Mental Status: He is alert and oriented to person, place, and time.       Imaging: CLINICAL DATA:  Status post coronary artery bypass graft.   EXAM: CHEST - 2 VIEW   COMPARISON:  October 11, 2023.   FINDINGS: The heart size and mediastinal contours are within normal limits. Sternotomy wires are noted. No pneumothorax or pleural effusion is noted. No acute pulmonary abnormality is noted. The  visualized skeletal structures are unremarkable.   IMPRESSION: No active cardiopulmonary disease.     Electronically Signed   By: Lynwood Landy Raddle M.D.   On: 11/22/2023 11:36   Assessment/Plan: S/P CABG x 2 We reviewed today's chest x ray. We discussed driving and he is able to continue to drive since he is not taking narcotics for pain.  We discussed participation in cardiac rehab and he is cleared from a surgical standpoint at this time.  He should continue to increase his exercise and activity as tolerated.   He has been seen by cardiology.  His metoprolol  was decreased to 25 mg BID. He is to continue with aspirin , atorvastatin  and enalapril  as prescribed.  Continue to check blood pressure and report high/low readings to cardiology or PCP.  Follow up with [facility] as needed     Christopher CHRISTELLA Rough, PA-C 2:35 PM 11/22/23

## 2023-12-02 ENCOUNTER — Telehealth: Payer: Self-pay | Admitting: Student

## 2023-12-02 MED ORDER — METOPROLOL TARTRATE 25 MG PO TABS: 25.0000 mg | ORAL_TABLET | Freq: Two times a day (BID) | ORAL | 3 refills | Status: AC

## 2023-12-02 NOTE — Telephone Encounter (Signed)
*  STAT* If patient is at the pharmacy, call can be transferred to refill team.   1. Which medications need to be refilled? (please list name of each medication and dose if known)   metoprolol  tartrate (LOPRESSOR ) 25 MG tablet     2. Would you like to learn more about the convenience, safety, & potential cost savings by using the [facility]? No   3. Are you open to using the [facility] (Type [facility]. ). No   4. Which pharmacy/location (including street and city if local pharmacy) is medication to be sent to? [id number] - [place], [place] - [address]     5. Do they need a 30 day or 90 day supply? 90 day

## 2023-12-02 NOTE — Telephone Encounter (Signed)
 Pt's medication was sent to pt's pharmacy as requested. Confirmation received.

## 2023-12-25 ENCOUNTER — Telehealth: Payer: Self-pay | Admitting: Nurse Practitioner

## 2023-12-25 ENCOUNTER — Ambulatory Visit: Admitting: Nurse Practitioner

## 2023-12-25 VITALS — BP 122/60 | HR 58 | Temp 97.7°F | Ht 68.0 in | Wt 194.2 lb

## 2023-12-25 DIAGNOSIS — N5201 Erectile dysfunction due to arterial insufficiency: Secondary | ICD-10-CM

## 2023-12-25 DIAGNOSIS — Z23 Encounter for immunization: Secondary | ICD-10-CM | POA: Diagnosis not present

## 2023-12-25 DIAGNOSIS — I10 Essential (primary) hypertension: Secondary | ICD-10-CM | POA: Diagnosis not present

## 2023-12-25 DIAGNOSIS — E1149 Type 2 diabetes mellitus with other diabetic neurological complication: Secondary | ICD-10-CM | POA: Diagnosis not present

## 2023-12-25 DIAGNOSIS — Z7984 Long term (current) use of oral hypoglycemic drugs: Secondary | ICD-10-CM | POA: Diagnosis not present

## 2023-12-25 LAB — POCT GLYCOSYLATED HEMOGLOBIN (HGB A1C): Hemoglobin A1C: 6.2 % — AB (ref 4.0–5.6)

## 2023-12-25 NOTE — Patient Instructions (Signed)
 Nice to see you today  I will be in touch once I hear from Dr. Mady Follow up with me in 6 months for you physical and labs, sooner if you need me   We did update your flu vaccine today

## 2023-12-25 NOTE — Progress Notes (Signed)
 Established Patient Office Visit  Subjective   Patient ID: Christopher Oconnell, male    DOB: 1950/12/09  Age: 73 y.o. MRN: [id number]  Chief Complaint  Patient presents with   Diabetes    Discussed the use of AI scribe software for clinical note transcription with the patient, who gave verbal consent to proceed.  History of Present Illness Christopher Oconnell is a 73 year old male with diabetes and coronary artery disease who presents for follow-up after recent heart surgery.  He underwent a two-vessel coronary artery bypass grafting (CABG) with two additional blockages, one at 60% and another at 30%, which were not addressed. Post-surgery, he experiences lightheadedness, particularly when standing up after sitting, which he attributes to changes in his blood pressure medication. He is currently on metoprolol  25 mg twice daily and enalapril . Previously, he took amlodipine  and another unspecified medication, which were discontinued. His home blood pressure readings have been low, ranging from 106 to 140 systolic, and he feels fine with these readings.  He has not been checking his blood sugar levels recently but notes that his A1c has improved to 6.2 from 6.4. He continues to take metformin  twice daily. His blood sugar was monitored frequently during his recent hospital stay for heart surgery.  He engages in physical activity, walking about a mile and a half daily since his surgery. He also lifts three-pound weights and uses a pedal exerciser when the weather is not conducive to walking. He experiences issues with his left arm, including pain and weakness, which he attributes to nerve involvement from the surgery. He takes Tylenol  for pain relief, which helps. No numbness or tingling down the arm but notes weakness when lifting.  His diet includes a good breakfast, a sandwich for lunch, and sometimes a prefixed dinner. He snacks at night and primarily drinks water with lemon or fruit  punch additives, tea, and decaf coffee. He mentions an improvement in his sex drive post-surgery but experiences difficulty with achieving sufficient erection for penetration. He previously used Viagra, which was effective until his heart issues worsened.  He is scheduled for a follow-up with his cardiologist in November and has an appointment with a pulmonologist. He also plans to undergo cataract surgery in December. He started wearing prescription glasses for distance vision around age 72.     Review of Systems  Constitutional:  Negative for chills and fever.  Respiratory:  Negative for shortness of breath.   Cardiovascular:  Negative for chest pain.  Neurological:  Positive for dizziness. Negative for headaches.      Objective:     BP 122/60   Pulse (!) 58   Temp 97.7 F (36.5 C) (Oral)   Ht 5' 8 (1.727 m)   Wt 194 lb 3.2 oz (88.1 kg)   SpO2 97%   BMI 29.53 kg/m  BP Readings from Last 3 Encounters:  12/25/23 122/60  11/22/23 127/76  10/30/23 120/60   Wt Readings from Last 3 Encounters:  12/25/23 194 lb 3.2 oz (88.1 kg)  11/22/23 197 lb (89.4 kg)  10/30/23 194 lb 6 oz (88.2 kg)   SpO2 Readings from Last 3 Encounters:  12/25/23 97%  11/22/23 94%  10/30/23 96%      Physical Exam Vitals and nursing note reviewed.  Constitutional:      Appearance: Normal appearance.  Cardiovascular:     Rate and Rhythm: Normal rate and regular rhythm.     Heart sounds: Normal heart sounds.  Pulmonary:  Effort: Pulmonary effort is normal.     Breath sounds: Normal breath sounds.  Neurological:     Mental Status: He is alert.      Results for orders placed or performed in visit on 12/25/23  POCT glycosylated hemoglobin (Hb A1C)  Result Value Ref Range   Hemoglobin A1C 6.2 (A) 4.0 - 5.6 %   HbA1c POC (<> result, manual entry)     HbA1c, POC (prediabetic range)     HbA1c, POC (controlled diabetic range)        The ASCVD Risk score (Arnett DK, et al., 2019) failed  to calculate for the following reasons:   The valid total cholesterol range is 130 to 320 mg/dL    Assessment & Plan:   Problem List Items Addressed This Visit       Endocrine   Type 2 diabetes mellitus with neurological complications (HCC) - Primary   Relevant Orders   POCT glycosylated hemoglobin (Hb A1C) (Completed)   Other Visit Diagnoses       Erectile dysfunction due to arterial insufficiency         Need for influenza vaccination       Relevant Orders   Flu vaccine HIGH DOSE PF(Fluzone Trivalent) (Completed)      Assessment and Plan Assessment & Plan Status post coronary artery bypass grafting Post-operative recovery ongoing with left shoulder pain and weakness, possibly multifactorial including rotator cuff involvement. - Continue cardiac rehabilitation exercises as tolerated. - Monitor shoulder pain and consider referral to orthopedic specialist if no improvement.  Left shoulder pain and weakness Persistent left shoulder pain and weakness, possibly related to recent cardiac surgery. Considering rotator cuff involvement. - Monitor symptoms and consider referral to orthopedic specialist for shoulder evaluation if no improvement.  Erectile dysfunction, post-cardiac surgery Erectile dysfunction post-cardiac surgery. Interest in resuming Viagra, requires cardiology clearance. Discussed potential side effects of sildenafil. - Contact cardiology to obtain clearance for resuming sildenafil (Viagra). - Discuss potential side effects of sildenafil, including headaches and stuffy nose.  Lightheadedness Intermittent lightheadedness likely related to metoprolol  affecting heart rate. Home blood pressure readings lower normal. - change positions slowly and stay hydrated  Essential hypertension Blood pressure well-controlled with metoprolol  and enalapril . Home readings low, occasional lightheadedness upon standing. - Consider upgrading home blood pressure cuff for more accurate  readings. - Continue metoprolol  and enalapril  as prescribed.  Type 2 diabetes mellitus with diabetic neuropathy Blood sugar levels well-controlled with A1c of 6.2. Continues on metformin . - Continue metformin  500 mg orally twice a day with food.  Return in about 6 months (around 06/24/2024) for CPE and Labs.    Adina Crandall, NP

## 2023-12-25 NOTE — Telephone Encounter (Signed)
 Dr. Mady  Saw Christopher Oconnell in office today and I want to start him on a PDE5 but know that he just had a CABG. Want to make sure cardiology is on board with this prior to prescribing.   Thanks for you help Matt C

## 2023-12-26 MED ORDER — SILDENAFIL CITRATE 100 MG PO TABS: 50.0000 mg | ORAL_TABLET | Freq: Every day | ORAL | 5 refills | Status: AC | PRN

## 2023-12-26 NOTE — Addendum Note (Signed)
 Addended by: WENDEE LYNWOOD HERO on: 12/26/2023 01:34 PM   Modules accepted: Orders

## 2023-12-26 NOTE — Telephone Encounter (Signed)
 As long as he is not having angina, I think it would be fine to try a PDE5 inhibitor for erectile dysfunction.  He should not take any nitroglycerin  if he were to experience chest pain within 24 hours of taking sildenafil or 48 hours of taking tadalafil.  Please let me know if any other questions or concerns arise.  [place]

## 2023-12-27 ENCOUNTER — Other Ambulatory Visit: Payer: Self-pay | Admitting: Nurse Practitioner

## 2023-12-27 ENCOUNTER — Other Ambulatory Visit

## 2023-12-27 ENCOUNTER — Ambulatory Visit (INDEPENDENT_AMBULATORY_CARE_PROVIDER_SITE_OTHER)

## 2023-12-27 DIAGNOSIS — Q666 Other congenital valgus deformities of feet: Secondary | ICD-10-CM | POA: Diagnosis not present

## 2023-12-27 DIAGNOSIS — E1149 Type 2 diabetes mellitus with other diabetic neurological complication: Secondary | ICD-10-CM | POA: Diagnosis not present

## 2023-12-27 NOTE — Progress Notes (Signed)
  Orthotics   Patient was present and evaluated for Custom molded foot orthotics. Patient will benefit from CFO's to provide total contact to BIL MLA's helping to balance and distribute body weight more evenly across BIL feet helping to reduce plantar pressure and pain. Orthotic will also encourage FF / RF alignment  Patient was scanned today and will return for fitting upon receipt  HTA covered at 80% patient is aware  Lolita Schultze Cped, CFo, CFm

## 2023-12-31 ENCOUNTER — Ambulatory Visit: Admitting: Podiatry

## 2023-12-31 DIAGNOSIS — Q828 Other specified congenital malformations of skin: Secondary | ICD-10-CM | POA: Diagnosis not present

## 2023-12-31 DIAGNOSIS — Q666 Other congenital valgus deformities of feet: Secondary | ICD-10-CM

## 2023-12-31 NOTE — Progress Notes (Signed)
 Subjective:  Patient ID: Christopher Oconnell, male    DOB: [birth year],  MRN: [id number]  Chief Complaint  Patient presents with   Callouses    73 y.o. male presents with the above complaint.  Patient presents for follow-up of bilateral hallux IPJ hyperkeratotic lesion/preulcerative callus he states is still causing some discomfort overall doing little bit better denies any other acute complaints.   Review of Systems: Negative except as noted in the HPI. Denies N/V/F/Ch.  Past Medical History:  Diagnosis Date   Acne    Arthritis    Asthma    Atypical mole 10/19/2020   R med bicep, mod to severe, exc 11/08/20   Basal cell carcinoma 11/05/2019   Right ear above crus. Nodular and infiltrative patterns. Excised 01/19/2020, margins free.   Coronary artery disease    Diabetes mellitus without complication (HCC)    Dysplastic nevus 11/08/2020   left med bicep at junction of L deltoid, moderate atypia   Dysplastic nevus 09/17/2023   R mid to low back - moderate   Dysplastic nevus 09/17/2023   R sup scapula medial - moderate   GERD (gastroesophageal reflux disease)    Hyperlipidemia    Hypertension    ILD (interstitial lung disease) (HCC)    NSIP   Skin cancer     Current Outpatient Medications:    acetaminophen  (TYLENOL ) 325 MG tablet, Take 2 tablets (650 mg total) by mouth every 6 (six) hours as needed for mild pain (pain score 1-3)., Disp: , Rfl:    aspirin  EC 325 MG tablet, Take 1 tablet (325 mg total) by mouth daily., Disp: , Rfl:    atorvastatin  (LIPITOR) 40 MG tablet, TAKE 1 TABLET BY MOUTH EVERY DAY, Disp: 90 tablet, Rfl: 3   enalapril  (VASOTEC ) 20 MG tablet, TAKE 1 TABLET BY MOUTH EVERY DAY, Disp: 90 tablet, Rfl: 3   fluticasone -salmeterol (ADVAIR) 250-50 MCG/ACT AEPB, Inhale 1 puff into the lungs in the morning and at bedtime., Disp: , Rfl:    Lancets (FREESTYLE) lancets, Use 1 lancet as directed fingerstick Once a day to test blood sugar for Dx: E11.9, Disp: , Rfl:     metFORMIN  (GLUCOPHAGE ) 500 MG tablet, TAKE 1 TABLET BY MOUTH TWICE A DAY WITH FOOD, Disp: 180 tablet, Rfl: 1   metoprolol  tartrate (LOPRESSOR ) 25 MG tablet, Take 1 tablet (25 mg total) by mouth 2 (two) times daily., Disp: 180 tablet, Rfl: 3   Omega-3 Fatty Acids (FISH OIL PO), Take 1 capsule by mouth daily., Disp: , Rfl:    pantoprazole  (PROTONIX ) 40 MG tablet, Take 40 mg by mouth daily as needed (heart burn)., Disp: , Rfl:    sildenafil (VIAGRA) 100 MG tablet, Take 0.5-1 tablets (50-100 mg total) by mouth daily as needed for erectile dysfunction., Disp: 6 tablet, Rfl: 5   Turmeric (QC TUMERIC COMPLEX PO), Take 1 capsule by mouth daily., Disp: , Rfl:   Social History   Tobacco Use  Smoking Status Former   Current packs/day: 0.00   Average packs/day: 3.5 packs/day for 30.0 years (105.0 ttl pk-yrs)   Types: Cigarettes   Start date: 29   Quit date: 1993   Years since quitting: 32.8  Smokeless Tobacco Never    No Known Allergies Objective:  There were no vitals filed for this visit. There is no height or weight on file to calculate BMI. Constitutional Well developed. Well nourished.  Vascular Dorsalis pedis pulses palpable bilaterally. Posterior tibial pulses palpable bilaterally. Capillary refill normal to all digits.  No cyanosis or clubbing noted. Pedal hair growth normal.  Neurologic Normal speech. Oriented to person, place, and time. Epicritic sensation to light touch grossly present bilaterally.  Dermatologic Bilateral hallux IPJ hyperkeratotic lesion with central nucleated core noted pain on palpation to the lesion.  Pinch callus noted.  Limited range of motion noted of the first metatarsophalangeal joint  Orthopedic: Normal joint ROM without pain or crepitus bilaterally. No visible deformities. No bony tenderness.   Radiographs: None Assessment:   No diagnosis found.   Plan:  Patient was evaluated and treated and all questions answered.  Bilateral hallux  porokeratosis IPJ -All questions and concerns were discussed with the patient in extensive detail given the amount of callus buildup there is patient would benefit from debridement of the lesion using chisel blade handle the lesion was debrided down to healthy dry tissue.  No complication noted no pinpoint bleeding noted.  No ulceration noted. -I discussed shoe gear modification.  Pes planovalgus -I explained to patient the etiology of pes planovalgus and relationship with Planter fasciitis and various treatment options were discussed.  Given patient foot structure in the setting of Planter fasciitis I believe patient will benefit from custom-made orthotics to help control the hindfoot motion support the arch of the foot and take the stress away from plantar fascial.  Patient agrees with the plan like to proceed with orthotics -Patient was casted for orthotics with Tr[facility]   No follow-ups on file.

## 2024-01-13 ENCOUNTER — Telehealth: Payer: Self-pay

## 2024-01-13 NOTE — Telephone Encounter (Signed)
 Orthotics are here Charges pending insurance No financial form signed or on file Appt set for [employer] 11/6  Orthotics in [place] bin

## 2024-01-16 ENCOUNTER — Other Ambulatory Visit

## 2024-01-17 ENCOUNTER — Telehealth: Payer: Self-pay | Admitting: Internal Medicine

## 2024-01-17 NOTE — Telephone Encounter (Signed)
   Patient Name: Christopher Oconnell  DOB: [birth year] MRN: [id number]  Primary Cardiologist: Lonni Hanson, MD  Chart reviewed as part of pre-operative protocol coverage. Cataract extractions are recognized in guidelines as low risk surgeries that do not typically require specific preoperative testing or holding of blood thinner therapy. Therefore, given past medical history and time since last visit, based on ACC/AHA guidelines, Christopher Oconnell would be at acceptable risk for the planned procedure without further cardiovascular testing.   I will route this recommendation to the requesting party via Epic fax function and remove from pre-op pool.  Please call with questions.  Lamarr Satterfield, NP 01/17/2024, 9:26 AM

## 2024-01-17 NOTE — Telephone Encounter (Signed)
   Pre-operative Risk Assessment    Patient Name: Christopher Oconnell  DOB: [birth year] MRN: [id number]  Date of last office visit: 10/30/23 Date of next office visit: 01/31/24   Request for Surgical Clearance    Procedure:  CATARACTS { Date of Surgery:  Clearance 02/10/24  & 02/24/24                         Surgeon:  DR ADINE NOVAK Surgeon's Group or Practice Name:  [facility] Phone number:  [phone] Fax number:  [phone]  Type of Clearance Requested:   - Medical    Type of Anesthesia:  Not Indicated   Additional requests/questions:    Signed, Samule LITTIE Bristol   01/17/2024, 9:07 AM

## 2024-01-20 ENCOUNTER — Ambulatory Visit: Payer: Self-pay

## 2024-01-20 NOTE — Telephone Encounter (Signed)
 Copied from CRM [id number]. Topic: Clinical - Red Word Triage >> Jan 20, 2024  8:51 AM Treva T wrote: [facility] that prompted transfer to Nurse Triage: Patient calling, states he is having worsening pain in left neck/shoulder area, radiating down left arm pain, with difficulty in mobility.   Initial onset about 1 month ago, and has gotten worse since bypass surgery. Reason for Disposition . [1] SEVERE neck pain (e.g., excruciating, unable to do any normal activities) AND [2] not improved after 2 hours of pain medicine  Answer Assessment - Initial Assessment Questions 1. ONSET: When did the pain begin?      Several weeks 2. LOCATION: Where does it hurt?      Left neck and down to shoulder 3. PATTERN Does the pain come and go, or has it been constant since it started?      Comes and goes 4. SEVERITY: How bad is the pain?  (Scale 0-10; or none or slight stiffness, mild, moderate, severe)     8-9 5. RADIATION: Does the pain go anywhere else, shoot into your arms?     yes 6. CORD SYMPTOMS: Any weakness or numbness of the arms or legs?     no 7. CAUSE: What do you think is causing the neck pain?     unsure 8. NECK OVERUSE: Any recent activities that involved turning or twisting the neck?     no 9. OTHER SYMPTOMS: Do you have any other symptoms? (e.g., headache, fever, chest pain, difficulty breathing, neck swelling)     no 10. PREGNANCY: Is there any chance you are pregnant? When was your last menstrual period?       N/a  Protocols used: Neck Pain or Stiffness-A-AH

## 2024-01-20 NOTE — Telephone Encounter (Signed)
 Left detailed voicemail for patient to call the office back.

## 2024-01-20 NOTE — Telephone Encounter (Signed)
 Can we get him scheduled with Dr. Watt please

## 2024-01-20 NOTE — Telephone Encounter (Signed)
 FYI Only or Action Required?: Action required by provider: clinical question for provider.  Patient was last seen in primary care on 12/25/2023 by Wendee Lynwood HERO, NP.  Called Nurse Triage reporting Neck Pain.  Symptoms began several weeks ago.  Interventions attempted: Nothing.  Symptoms are: unchanged.Still having neck, shoulder pain. Asking if PCP will order x-rays.  Triage Disposition: See HCP Within 4 Hours (Or PCP Triage)  Patient/caregiver understands and will follow disposition?: No, wishes to speak with PCP

## 2024-01-22 ENCOUNTER — Telehealth: Payer: Self-pay | Admitting: Internal Medicine

## 2024-01-22 DIAGNOSIS — E119 Type 2 diabetes mellitus without complications: Secondary | ICD-10-CM | POA: Diagnosis not present

## 2024-01-22 DIAGNOSIS — H04123 Dry eye syndrome of bilateral lacrimal glands: Secondary | ICD-10-CM | POA: Diagnosis not present

## 2024-01-22 DIAGNOSIS — H2513 Age-related nuclear cataract, bilateral: Secondary | ICD-10-CM | POA: Diagnosis not present

## 2024-01-22 DIAGNOSIS — H25013 Cortical age-related cataract, bilateral: Secondary | ICD-10-CM | POA: Diagnosis not present

## 2024-01-22 LAB — OPHTHALMOLOGY REPORT-SCANNED

## 2024-01-22 NOTE — Telephone Encounter (Signed)
 [facility] calling to advise office disregard duplicate clearance faxed.

## 2024-01-22 NOTE — Telephone Encounter (Signed)
 Noted

## 2024-01-22 NOTE — Telephone Encounter (Signed)
 With Family Medicine Michail Schroeder, MD) 01/23/2024 at 2:40 PM

## 2024-01-22 NOTE — Progress Notes (Signed)
 Christopher Cayer T. Jaylenn Altier, MD, CAQ Sports Medicine [facility] [address] [place], [address]  Phone: [phone]  FAX: [phone]  Christopher Oconnell - 73 y.o. male  MRN [id number]  Date of Birth: 03/01/1951  Date: 01/23/2024  PCP: Wendee Lynwood HERO, NP  Referral: Wendee Lynwood HERO, NP  Chief Complaint  Patient presents with   Shoulder Pain    Left Shoulder-Radiates from neck down to left shoulder   Subjective:   Christopher Oconnell is a 73 y.o. very pleasant male patient with Body mass index is 30.28 kg/m. who presents with the following:  Discussed the use of AI scribe software for clinical note transcription with the patient, who gave verbal consent to proceed.  Patient presents with ongoing neck pain.  History is significant for history of CABG, interstitial lung disease.  He also has been having some left-sided shoulder and weakness. History of Present Illness Christopher Oconnell is a 73 year old male with coronary artery disease who presents with left-sided shoulder pain, deep dull ache in the shoulder, and notable restricted motion..  He has been experiencing shoulder pain radiating down his arm since his double bypass surgery on October 10, 2023. Initially, there were no issues for over a month post-surgery, but then the pain began, radiating from the shoulder down the arm. The pain causes rare numbness and does not completely resolve with Tylenol  Extra Strength, which he takes regularly.  The pain affects his ability to sleep, as he cannot sleep on the affected side without discomfort. No recent trauma or falls that could have contributed to the shoulder pain.  He has a history of diabetes, with a recent blood sugar level of 6.2, and is currently on metformin . His blood sugar levels have improved from previous readings but are still being managed with medication.    Review of Systems is noted in the HPI, as  appropriate  Objective:   BP 110/72   Pulse 63   Temp 98.6 F (37 C) (Temporal)   Ht 5' 8 (1.727 m)   Wt 199 lb 2 oz (90.3 kg)   SpO2 98%   BMI 30.28 kg/m    Shoulder: R and L Inspection: No muscle wasting or winging Ecchymosis/edema: neg  AC joint, scapula, clavicle: NT Cervical spine: NT, full ROM Spurling's: neg ABNORMAL SIDE TESTED: L UNLESS OTHERWISE NOTED, THE CONTRALATERAL SIDE HAS FULL RANGE OF MOTION. Abduction: 5/5, LIMITED TO 100 DEGREES Flexion: 5/5, LIMITED TO 105 DEGNO ROM  IR, lift-off: 5/5. TESTED AT 90 DEGREES OF ABDUCTION, LIMITED TO 5 DEGREES ER at neutral:  5/5, TESTED AT 90 DEGREES OF ABDUCTION, LIMITED TO 12 DEGREES AC crossover and compression: PAIN Drop Test: neg Empty Can: neg Supraspinatus insertion: NT Bicipital groove: NT ALL OTHER SPECIAL TESTING EQUIVOCAL GIVEN LOSS OF MOTION C5-T1 intact Sensation intact Grip 5/5    Laboratory and Imaging Data:  Assessment and Plan:     ICD-10-CM   1. Adhesive capsulitis of left shoulder  M75.02 triamcinolone acetonide (KENALOG-40) injection 40 mg    2. Acute neck pain  M54.2     3. Acute pain of left shoulder  M25.512 DG Shoulder Left    triamcinolone acetonide (KENALOG-40) injection 40 mg    4. Type 2 diabetes mellitus with neurological complications [name])  E11.49      Assessment & Plan Patient has marked restriction of motion in all directions.  Frozen shoulder, and altered movement patterns after a  coronary bypass graft with open heart surgery compounded with a history of diabetes. We reviewed that symptoms usually last 12 months, can last up to 18 months and rarely permanent. He is still in the freezing phase and he is having some significant pain I gave him [employer]'s frozen shoulder protocol to work on at home - We will do an intra-articular injection today  Cervical arthritis Limited neck motion with stiffness and pain, but I do not think that this is his primary issue  Medication  Management during today's office visit: Meds ordered this encounter  Medications   triamcinolone acetonide (KENALOG-40) injection 40 mg   There are no discontinued medications.  Orders placed today for conditions managed today: Orders Placed This Encounter  Procedures   DG Shoulder Left    Disposition: No follow-ups on file.  [employer] One speech-to-text software was used for transcription in this dictation.  Possible transcriptional errors can occur using Animal nutritionist.   Signed,  Jacques DASEN. [name], MD   Outpatient Encounter Medications as of 01/23/2024  Medication Sig   acetaminophen  (TYLENOL ) 325 MG tablet Take 2 tablets (650 mg total) by mouth every 6 (six) hours as needed for mild pain (pain score 1-3).   aspirin  EC 325 MG tablet Take 1 tablet (325 mg total) by mouth daily.   atorvastatin  (LIPITOR) 40 MG tablet TAKE 1 TABLET BY MOUTH EVERY DAY   enalapril  (VASOTEC ) 20 MG tablet TAKE 1 TABLET BY MOUTH EVERY DAY   fluticasone -salmeterol (ADVAIR) 250-50 MCG/ACT AEPB Inhale 1 puff into the lungs in the morning and at bedtime.   Lancets (FREESTYLE) lancets Use 1 lancet as directed fingerstick Once a day to test blood sugar for Dx: E11.9   metFORMIN  (GLUCOPHAGE ) 500 MG tablet TAKE 1 TABLET BY MOUTH TWICE A DAY WITH FOOD   metoprolol  tartrate (LOPRESSOR ) 25 MG tablet Take 1 tablet (25 mg total) by mouth 2 (two) times daily.   Omega-3 Fatty Acids (FISH OIL PO) Take 1 capsule by mouth daily.   pantoprazole  (PROTONIX ) 40 MG tablet Take 40 mg by mouth daily as needed (heart burn).   sildenafil (VIAGRA) 100 MG tablet Take 0.5-1 tablets (50-100 mg total) by mouth daily as needed for erectile dysfunction.   Turmeric (QC TUMERIC COMPLEX PO) Take 1 capsule by mouth daily.   [EXPIRED] triamcinolone acetonide (KENALOG-40) injection 40 mg    No facility-administered encounter medications on file as of 01/23/2024.

## 2024-01-23 ENCOUNTER — Encounter: Payer: Self-pay | Admitting: Family Medicine

## 2024-01-23 ENCOUNTER — Ambulatory Visit (INDEPENDENT_AMBULATORY_CARE_PROVIDER_SITE_OTHER): Admitting: Podiatry

## 2024-01-23 ENCOUNTER — Ambulatory Visit (INDEPENDENT_AMBULATORY_CARE_PROVIDER_SITE_OTHER)
Admission: RE | Admit: 2024-01-23 | Discharge: 2024-01-23 | Disposition: A | Discharge status: Home / Self Care | Source: Ambulatory Visit | Attending: Family Medicine | Admitting: Family Medicine

## 2024-01-23 ENCOUNTER — Ambulatory Visit: Admitting: Family Medicine

## 2024-01-23 VITALS — BP 110/72 | HR 63 | Temp 98.6°F | Ht 68.0 in | Wt 199.1 lb

## 2024-01-23 DIAGNOSIS — M7502 Adhesive capsulitis of left shoulder: Secondary | ICD-10-CM | POA: Diagnosis not present

## 2024-01-23 DIAGNOSIS — M542 Cervicalgia: Secondary | ICD-10-CM | POA: Diagnosis not present

## 2024-01-23 DIAGNOSIS — M19012 Primary osteoarthritis, left shoulder: Secondary | ICD-10-CM | POA: Diagnosis not present

## 2024-01-23 DIAGNOSIS — M25512 Pain in left shoulder: Secondary | ICD-10-CM | POA: Diagnosis not present

## 2024-01-23 DIAGNOSIS — Q666 Other congenital valgus deformities of feet: Secondary | ICD-10-CM

## 2024-01-23 DIAGNOSIS — Q828 Other specified congenital malformations of skin: Secondary | ICD-10-CM

## 2024-01-23 DIAGNOSIS — E1149 Type 2 diabetes mellitus with other diabetic neurological complication: Secondary | ICD-10-CM | POA: Diagnosis not present

## 2024-01-23 MED ORDER — TRIAMCINOLONE ACETONIDE 40 MG/ML IJ SUSP
40.0000 mg | Freq: Once | INTRAMUSCULAR | Status: AC
  Administered 2024-01-23: 40 mg via INTRA_ARTICULAR

## 2024-01-23 NOTE — Progress Notes (Signed)
 Orthotics are dispensed they are functioning well no acute complaints no complication noted.

## 2024-01-26 ENCOUNTER — Ambulatory Visit: Payer: Self-pay | Admitting: Family Medicine

## 2024-01-30 DIAGNOSIS — K2100 Gastro-esophageal reflux disease with esophagitis, without bleeding: Secondary | ICD-10-CM | POA: Insufficient documentation

## 2024-01-30 DIAGNOSIS — J8410 Pulmonary fibrosis, unspecified: Secondary | ICD-10-CM | POA: Diagnosis not present

## 2024-01-30 DIAGNOSIS — J849 Interstitial pulmonary disease, unspecified: Secondary | ICD-10-CM | POA: Diagnosis not present

## 2024-01-30 DIAGNOSIS — R490 Dysphonia: Secondary | ICD-10-CM | POA: Diagnosis not present

## 2024-01-30 NOTE — Discharge Instructions (Signed)

## 2024-01-31 ENCOUNTER — Encounter: Payer: Self-pay | Admitting: Internal Medicine

## 2024-01-31 ENCOUNTER — Ambulatory Visit: Attending: Internal Medicine | Admitting: Internal Medicine

## 2024-01-31 VITALS — BP 128/60 | HR 60 | Ht 68.0 in | Wt 196.2 lb

## 2024-01-31 DIAGNOSIS — I2510 Atherosclerotic heart disease of native coronary artery without angina pectoris: Secondary | ICD-10-CM | POA: Diagnosis not present

## 2024-01-31 DIAGNOSIS — I10 Essential (primary) hypertension: Secondary | ICD-10-CM | POA: Diagnosis not present

## 2024-01-31 DIAGNOSIS — E785 Hyperlipidemia, unspecified: Secondary | ICD-10-CM

## 2024-01-31 DIAGNOSIS — Z01810 Encounter for preprocedural cardiovascular examination: Secondary | ICD-10-CM | POA: Diagnosis not present

## 2024-01-31 MED ORDER — ASPIRIN 81 MG PO TBEC: 81.0000 mg | DELAYED_RELEASE_TABLET | Freq: Every day | ORAL | Status: AC

## 2024-01-31 NOTE — Patient Instructions (Addendum)
 Medication Instructions:  Your physician has recommended you make the following change in your medication: Reduce Aspirin  to 81 mg a day. (May purchase over the counter.)  *If you need a refill on your cardiac medications before your next appointment, please call your pharmacy*  Lab Work: No labs ordered today    Testing/Procedures: No test ordered today   Follow-Up: At [facility], you and your health needs are our priority.  As part of our continuing mission to provide you with exceptional heart care, our providers are all part of one team.  This team includes your primary Cardiologist (physician) and Advanced Practice Providers or APPs (Physician Assistants and Nurse Practitioners) who all work together to provide you with the care you need, when you need it.  Your next appointment:   6 month(s)  Provider:   Lonni Hanson, MD

## 2024-01-31 NOTE — Progress Notes (Signed)
 Cardiology Office Note:  .   Date:  01/31/2024  ID:  Christopher Oconnell, DOB Jul 05, 1950, MRN [id number] PCP: Wendee Lynwood HERO, NP  [facility] Providers Cardiologist:  Lonni Hanson, MD     History of Present Illness: .   Christopher Oconnell is a 72 y.o. male with history of coronary artery disease status post angioplasty to D1 in 1993 and CABG in 09/2023 (LIMA-OM and SVG-PDA), stroke, hypertension, hyperlipidemia, type 2 diabetes mellitus, and interstitial lung disease, who presents for follow-up of coronary artery disease and shortness of breath.  He was last seen in our office in August by Barnie Hila, NP, at which time he was recovering well from his CABG.  He noted a puffy area at the top of his median sternotomy incision.  Exam showed approximated median sternotomy incision without erythema or drainage.  Due to fatigue, his metoprolol  was reduced to 25 mg twice daily.  Today, Christopher Oconnell reports that he has been feeling quite well.  He is a little concerned about a small bump that has developed along the left aspect of his median sternotomy incision.  It is a little tender at times (not today).  He denies trauma to the area.  He has not noticed any significant swelling, erythema, or drainage.  He also notes that he was recently diagnosed with frozen shoulder syndrome.  He received an injection and has been doing physical therapy with some improvement.  He denies chest pain.  His chronic shortness of breath has actually improved since our last visit.  He saw Dr. Parris yesterday and happily reports that his PFTs seem to be improving as well.  He has not had any lower extremity edema, palpitations, or lightheadedness.  He walks 1-1.5 miles per day and also does some weights.  He does not feel like he needs to participate in cardiac rehab, as he is quite active on his own.  He is scheduled to undergo cataract surgery next month.  ROS: See HPI  Studies Reviewed: .        Risk Assessment/Calculations:             Physical Exam:   VS:  BP 128/60 (BP Location: Left Arm, Patient Position: Sitting, Cuff Size: Normal)   Pulse 60   Ht 5' 8 (1.727 m)   Wt 196 lb 4 oz (89 kg)   SpO2 98%   BMI 29.84 kg/m    Wt Readings from Last 3 Encounters:  01/31/24 196 lb 4 oz (89 kg)  01/23/24 199 lb 2 oz (90.3 kg)  12/25/23 194 lb 3.2 oz (88.1 kg)    General:  NAD. Neck: No JVD or HJR. Lungs: Clear to auscultation bilaterally without wheezes or crackles. Heart: Regular rate and rhythm without murmurs, rubs, or gallops.  Median sternotomy incision is well-healed.  Question tiny subcutaneous nodule just to the patient's left of the sternotomy scar.  There is no erythema, discharge, or fluctuance. Abdomen: Soft, nontender, nondistended. Extremities: No lower extremity edema.  ASSESSMENT AND PLAN: .    Coronary artery disease: Christopher Oconnell has recovered well from his CABG this summer.  As he is now 3 months out from surgery, I think it is reasonable for him to de-escalate aspirin  from 325 mg daily to 81 mg daily.  We will continue metoprolol  and aggressive lipid therapy for secondary prevention.  I do not see any significant issues with his sternotomy scar.  Tiny subcutaneous nodule that is occasionally tender most likely reflect scar  tissue.  No evidence of nonunion or infection.  Hypertension: Blood pressure well-controlled today.  No medication changes at this time.  Hyperlipidemia: LDL well-controlled on last check in March.  Continue atorvastatin  40 mg daily.  Preoperative cardiovascular risk assessment: Christopher Oconnell is scheduled to undergo cardiac surgery next month.  He is able to complete at least 4 METS of activity and does not have any unstable symptoms.  I think it is reasonable for him to proceed with this low risk surgery without additional cardiac testing or intervention.  If possible, aspirin  81 mg daily should be continued in the perioperative  period.    Cardiac Rehabilitation Eligibility Assessment  The patient has declined or is not appropriate for cardiac rehabilitation.    Dispo: Return to clinic in 6 months.  Signed, Lonni Hanson, MD

## 2024-02-03 ENCOUNTER — Encounter: Payer: Self-pay | Admitting: Ophthalmology

## 2024-02-03 NOTE — Anesthesia Preprocedure Evaluation (Addendum)
 Anesthesia Evaluation  Patient identified by MRN, date of birth, ID band Patient awake    Reviewed: Allergy & Precautions, H&P , NPO status , Patient's Chart, lab work & pertinent test results  Airway Mallampati: III  TM Distance: <3 FB Neck ROM: Full    Dental no notable dental hx. (+) Caps No caps in front teeth upper nor lower:   Pulmonary asthma , former smoker   Pulmonary exam normal breath sounds clear to auscultation       Cardiovascular hypertension, + angina  + CAD and + DOE  Normal cardiovascular exam Rhythm:Regular Rate:Normal  08-26-23 echo 1. Left ventricular ejection fraction, by estimation, is 60 to 65%. Left  ventricular ejection fraction by 2D MOD biplane is 60.5 %. The left  ventricle has normal function. The left ventricle has no regional wall  motion abnormalities. Left ventricular  diastolic parameters are consistent with Grade I diastolic dysfunction  (impaired relaxation).   2. Right ventricular systolic function is normal. The right ventricular  size is normal.   3. The mitral valve is normal in structure. Mild mitral valve  regurgitation.   4. The aortic valve is tricuspid. Aortic valve regurgitation is not  visualized. Aortic valve sclerosis/calcification is present, without any  evidence of aortic stenosis. Aortic valve mean gradient measures 6.0 mmHg.   5. The inferior vena cava is normal in size with greater than 50%  respiratory variability, suggesting right atrial pressure of 3 mmHg.   CABG in 10/09/2023 (LIMA-OM and SVG-PDA  01-31-24 cardiac notes copied and pasted: Preoperative cardiovascular risk assessment: Christopher Oconnell is scheduled to undergo cardiac surgery next month.  He is able to complete at least 4 METS of activity and does not have any unstable symptoms.  I think it is reasonable for him to proceed with this low risk surgery without additional cardiac testing or intervention.  If  possible, aspirin  81 mg daily should be continued in the perioperative period. It is most likely that above reference to cardiac surgery was, in fact, reference to cataract surgery.    Chart reviewed as part of pre-operative protocol coverage. Cataract extractions are recognized in guidelines as low risk surgeries that do not typically require specific preoperative testing or holding of blood thinner therapy. Therefore, given past medical history and time since last visit, based on ACC/AHA guidelines, Christopher Oconnell would be at acceptable risk for the planned procedure without further cardiovascular testing.    I will route this recommendation to the requesting party via Epic fax function and remove from pre-op pool.   Please call with questions.   Christopher Satterfield, NP 01/17/2024, 9:26 AM     Neuro/Psych CVA negative neurological ROS  negative psych ROS   GI/Hepatic negative GI ROS, Neg liver ROS,GERD  ,,  Endo/Other  diabetes    Renal/GU negative Renal ROS  negative genitourinary   Musculoskeletal negative musculoskeletal ROS (+) Arthritis ,    Abdominal   Peds negative pediatric ROS (+)  Hematology negative hematology ROS (+)   Anesthesia Other Findings Diabetes mellitus without complication Hyperlipidemia Hypertension  Coronary artery disease Asthma  Arthritis GERD (gastroesophageal reflux disease) Basal cell carcinoma Atypical mole  Dysplastic nevus Acne  Dysplastic nevus Dysplastic nevus  ILD (interstitial lung disease)  Skin cancer  Stroke (HCC) S/P CABG x 2  Mild mitral regurgitation by prior echocardiogram Grade I diastolic dysfunction  Coronary artery disease involving native coronary artery of native heart without angina pectoris H/O coronary angioplasty     Reproductive/Obstetrics  negative OB ROS                              Anesthesia Physical Anesthesia Plan  ASA: 3  Anesthesia Plan: MAC   Post-op Pain  Management:    Induction: Intravenous  PONV Risk Score and Plan:   Airway Management Planned: Natural Airway and Nasal Cannula  Additional Equipment:   Intra-op Plan:   Post-operative Plan:   Informed Consent: I have reviewed the patients History and Physical, chart, labs and discussed the procedure including the risks, benefits and alternatives for the proposed anesthesia with the patient or authorized representative who has indicated his/her understanding and acceptance.     Dental Advisory Given  Plan Discussed with: Anesthesiologist, CRNA and Surgeon  Anesthesia Plan Comments: (Patient consented for risks of anesthesia including but not limited to:  - adverse reactions to medications - damage to eyes, teeth, lips or other oral mucosa - nerve damage due to positioning  - sore throat or hoarseness - Damage to heart, brain, nerves, lungs, other parts of body or loss of life  Patient voiced understanding and assent.)         Anesthesia Quick Evaluation

## 2024-02-10 ENCOUNTER — Ambulatory Visit: Payer: Self-pay | Admitting: Anesthesiology

## 2024-02-10 ENCOUNTER — Other Ambulatory Visit: Payer: Self-pay

## 2024-02-10 ENCOUNTER — Encounter
Admission: RE | Disposition: A | Discharge status: Home / Self Care | Payer: Self-pay | Source: Home / Self Care | Attending: Ophthalmology

## 2024-02-10 ENCOUNTER — Encounter: Payer: Self-pay | Admitting: Ophthalmology

## 2024-02-10 ENCOUNTER — Ambulatory Visit
Admission: RE | Admit: 2024-02-10 | Discharge: 2024-02-10 | Disposition: A | Discharge status: Home / Self Care | Attending: Ophthalmology | Admitting: Ophthalmology

## 2024-02-10 DIAGNOSIS — K219 Gastro-esophageal reflux disease without esophagitis: Secondary | ICD-10-CM | POA: Diagnosis not present

## 2024-02-10 DIAGNOSIS — H2511 Age-related nuclear cataract, right eye: Secondary | ICD-10-CM | POA: Diagnosis not present

## 2024-02-10 DIAGNOSIS — M1990 Unspecified osteoarthritis, unspecified site: Secondary | ICD-10-CM | POA: Diagnosis not present

## 2024-02-10 DIAGNOSIS — J45909 Unspecified asthma, uncomplicated: Secondary | ICD-10-CM | POA: Diagnosis not present

## 2024-02-10 DIAGNOSIS — I2510 Atherosclerotic heart disease of native coronary artery without angina pectoris: Secondary | ICD-10-CM | POA: Diagnosis not present

## 2024-02-10 DIAGNOSIS — E1136 Type 2 diabetes mellitus with diabetic cataract: Secondary | ICD-10-CM | POA: Diagnosis not present

## 2024-02-10 DIAGNOSIS — Z7984 Long term (current) use of oral hypoglycemic drugs: Secondary | ICD-10-CM | POA: Diagnosis not present

## 2024-02-10 DIAGNOSIS — E1169 Type 2 diabetes mellitus with other specified complication: Secondary | ICD-10-CM | POA: Diagnosis not present

## 2024-02-10 DIAGNOSIS — I10 Essential (primary) hypertension: Secondary | ICD-10-CM | POA: Diagnosis not present

## 2024-02-10 DIAGNOSIS — Z87891 Personal history of nicotine dependence: Secondary | ICD-10-CM | POA: Diagnosis not present

## 2024-02-10 DIAGNOSIS — E785 Hyperlipidemia, unspecified: Secondary | ICD-10-CM | POA: Diagnosis not present

## 2024-02-10 HISTORY — DX: Atherosclerotic heart disease of native coronary artery without angina pectoris: I25.10

## 2024-02-10 HISTORY — DX: Cerebral infarction, unspecified: I63.9

## 2024-02-10 HISTORY — PX: CATARACT EXTRACTION W/PHACO: SHX586

## 2024-02-10 LAB — GLUCOSE, CAPILLARY: Glucose-Capillary: 114 mg/dL — ABNORMAL HIGH (ref 70–99)

## 2024-02-10 SURGERY — PHACOEMULSIFICATION, CATARACT, WITH IOL INSERTION
Anesthesia: Monitor Anesthesia Care | Site: Eye | Laterality: Right

## 2024-02-10 MED ORDER — FENTANYL CITRATE (PF) 100 MCG/2ML IJ SOLN
INTRAMUSCULAR | Status: AC
  Filled 2024-02-10: qty 2

## 2024-02-10 MED ORDER — LACTATED RINGERS IV SOLN: INTRAVENOUS | Status: DC

## 2024-02-10 MED ORDER — TETRACAINE HCL 0.5 % OP SOLN
1.0000 [drp] | OPHTHALMIC | Status: DC | PRN
  Administered 2024-02-10 (×2): 1 [drp] via OPHTHALMIC

## 2024-02-10 MED ORDER — PHENYLEPHRINE HCL 10 % OP SOLN
OPHTHALMIC | Status: AC
  Filled 2024-02-10: qty 5

## 2024-02-10 MED ORDER — CYCLOPENTOLATE HCL 2 % OP SOLN
OPHTHALMIC | Status: AC
  Filled 2024-02-10: qty 2

## 2024-02-10 MED ORDER — SIGHTPATH DOSE#1 BSS IO SOLN
INTRAOCULAR | Status: DC | PRN
  Administered 2024-02-10: 15 mL via INTRAOCULAR

## 2024-02-10 MED ORDER — CYCLOPENTOLATE HCL 2 % OP SOLN
1.0000 [drp] | OPHTHALMIC | Status: AC
  Administered 2024-02-10 (×3): 1 [drp] via OPHTHALMIC

## 2024-02-10 MED ORDER — TETRACAINE HCL 0.5 % OP SOLN
1.0000 [drp] | OPHTHALMIC | Status: DC | PRN
  Administered 2024-02-10: 1 [drp] via OPHTHALMIC

## 2024-02-10 MED ORDER — SIGHTPATH DOSE#1 NA HYALUR & NA CHOND-NA HYALUR IO KIT
PACK | INTRAOCULAR | Status: DC | PRN
  Administered 2024-02-10: 1 via OPHTHALMIC

## 2024-02-10 MED ORDER — MOXIFLOXACIN HCL 0.5 % OP SOLN
OPHTHALMIC | Status: DC | PRN
  Administered 2024-02-10: .2 mL via OPHTHALMIC

## 2024-02-10 MED ORDER — LIDOCAINE HCL (PF) 2 % IJ SOLN
INTRAOCULAR | Status: DC | PRN
  Administered 2024-02-10: 4 mL via INTRAOCULAR

## 2024-02-10 MED ORDER — TETRACAINE HCL 0.5 % OP SOLN
OPHTHALMIC | Status: AC
  Filled 2024-02-10: qty 4

## 2024-02-10 MED ORDER — MIDAZOLAM HCL (PF) 2 MG/2ML IJ SOLN
INTRAMUSCULAR | Status: DC | PRN
  Administered 2024-02-10: 2 mg via INTRAVENOUS

## 2024-02-10 MED ORDER — PHENYLEPHRINE HCL 10 % OP SOLN
1.0000 [drp] | OPHTHALMIC | Status: AC
  Administered 2024-02-10 (×3): 1 [drp] via OPHTHALMIC

## 2024-02-10 MED ORDER — MIDAZOLAM HCL 2 MG/2ML IJ SOLN
INTRAMUSCULAR | Status: AC
  Filled 2024-02-10: qty 2

## 2024-02-10 MED ORDER — SIGHTPATH DOSE#1 BSS IO SOLN
INTRAOCULAR | Status: DC | PRN
  Administered 2024-02-10: 108 mL via OPHTHALMIC

## 2024-02-10 MED ORDER — FENTANYL CITRATE (PF) 100 MCG/2ML IJ SOLN
INTRAMUSCULAR | Status: DC | PRN
  Administered 2024-02-10: 50 ug via INTRAVENOUS

## 2024-02-10 SURGICAL SUPPLY — 8 items
DISSECTOR HYDRO NUCLEUS 50X22 (MISCELLANEOUS) ×1 IMPLANT
FEE CATARACT SUITE SIGHTPATH (MISCELLANEOUS) ×1 IMPLANT
GLOVE PI ULTRA LF STRL 7.5 (GLOVE) ×1 IMPLANT
GLOVE SURG SYN 6.5 PF PI BL (GLOVE) ×1 IMPLANT
GLOVE SURG SYN 8.5 PF PI BL (GLOVE) ×1 IMPLANT
LENS IOL CLRN PAN 3 22.0 IMPLANT
NDL FILTER BLUNT 18X1 1/2 (NEEDLE) ×1 IMPLANT
SYR 3ML LL SCALE MARK (SYRINGE) ×1 IMPLANT

## 2024-02-10 NOTE — Op Note (Signed)
 OPERATIVE NOTE  Christopher Oconnell [id number] 02/10/2024   PREOPERATIVE DIAGNOSIS:  Nuclear sclerotic cataract right eye.  H25.11   POSTOPERATIVE DIAGNOSIS:    Nuclear sclerotic cataract right eye.     PROCEDURE:  Phacoemusification with posterior chamber intraocular lens placement of the right eye   LENS:   Implant Name Type Inv. Item Serial No. Manufacturer Lot No. LRB No. Used Action  CLAREON PAN OPTIX Intraocular Lens  [id number] ALCON  Right 1 Implanted       Procedure(s): PHACOEMULSIFICATION, CATARACT, WITH IOL INSERTION 6.11 00:43.4 (Right)  SURGEON:  Adine Novak, MD, MPH  ANESTHESIOLOGIST: Anesthesiologist: Ola Donny JAYSON, MD CRNA: Jahoo, Sonia, CRNA; Veronica Alm JAYSON, CRNA   ANESTHESIA:  Topical with tetracaine drops augmented with 1% preservative-free intracameral lidocaine .  ESTIMATED BLOOD LOSS: less than 1 mL.   COMPLICATIONS:  None.   DESCRIPTION OF PROCEDURE:  The patient was identified in the holding room and transported to the operating room and placed in the supine position under the operating microscope.  The right eye was identified as the operative eye and it was prepped and draped in the usual sterile ophthalmic fashion.  The verion system was registered without difficulty.   A 1.0 millimeter clear-corneal paracentesis was made at the 10:30 position. 0.5 ml of preservative-free 1% lidocaine  with epinephrine  was injected into the anterior chamber.  The anterior chamber was filled with viscoelastic.  A 2.4 millimeter keratome was used to make a near-clear corneal incision at the 8:00 position.  A curvilinear capsulorrhexis was made with a cystotome and capsulorrhexis forceps.  Balanced salt solution was used to hydrodissect and hydrodelineate the nucleus.   Phacoemulsification was then used in stop and chop fashion to remove the lens nucleus and epinucleus.  The remaining cortex was then removed using the irrigation and aspiration handpiece. Viscoelastic  was then placed into the capsular bag to distend it for lens placement.  A lens was then injected into the capsular bag.  The remaining viscoelastic was aspirated.  The lens was rotated with guidance from the verion system.   Wounds were hydrated with balanced salt solution.  The anterior chamber was inflated to a physiologic pressure with balanced salt solution. The lens was well centered.  Intracameral vigamox 0.1 mL undiluted was injected into the eye and a drop placed onto the ocular surface.  No wound leaks were noted.  The patient was taken to the recovery room in stable condition without complications of anesthesia or surgery  Adine Novak 02/10/2024, 10:35 AM

## 2024-02-10 NOTE — H&P (Signed)
 [facility]   Primary Care Physician:  Wendee Lynwood HERO, NP Ophthalmologist: Dr. Adine Novak  Pre-Procedure History & Physical: HPI:  Christopher Oconnell is a 73 y.o. male here for cataract surgery.   Past Medical History:  Diagnosis Date   Acne    Arthritis    Asthma    Atypical mole 10/19/2020   R med bicep, mod to severe, exc 11/08/20   Basal cell carcinoma 11/05/2019   Right ear above crus. Nodular and infiltrative patterns. Excised 01/19/2020, margins free.   Coronary artery disease    Coronary artery disease involving native coronary artery of native heart without angina pectoris    Diabetes mellitus without complication (HCC)    Dysplastic nevus 11/08/2020   left med bicep at junction of L deltoid, moderate atypia   Dysplastic nevus 09/17/2023   R mid to low back - moderate   Dysplastic nevus 09/17/2023   R sup scapula medial - moderate   GERD (gastroesophageal reflux disease)    Grade I diastolic dysfunction 08/26/2023   H/O coronary angioplasty 1993   to D1   Hyperlipidemia    Hypertension    ILD (interstitial lung disease) (HCC)    NSIP   Mild mitral regurgitation by prior echocardiogram 08/26/2023   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   CABG in 09/2023 (LIMA-OM and SVG-PDA   Skin cancer    Stroke [facility])    MINI STROKE affected the eyes-doubl vision--now ok    Past Surgical History:  Procedure Laterality Date   COLONOSCOPY     CORONARY ANGIOPLASTY  1993   No stents   CORONARY ARTERY BYPASS GRAFT N/A 10/09/2023   Procedure: CORONARY ARTERY BYPASS GRAFTING (CABG) TIMES TWO USING LEFT INTERNAL MAMMARY ARTERY AND ENDOSCOPICALLY HARVESTED RIGHT GREATER SAPHENOUS VEIN;  Surgeon: Shyrl Linnie KIDD, MD;  Location: [facility] OR;  Service: Open Heart Surgery;  Laterality: N/A;   LEFT HEART CATH AND CORONARY ANGIOGRAPHY N/A 09/02/2023   Procedure: LEFT HEART CATH AND CORONARY ANGIOGRAPHY;  Surgeon: Mady Bruckner, MD;  Location: [facility] INVASIVE CV LAB;  Service: Cardiovascular;   Laterality: N/A;   NOSE SURGERY     broken nose and later re-set   SKIN CANCER EXCISION  01/2020   right ear    TEE WITHOUT CARDIOVERSION N/A 10/09/2023   Procedure: ECHOCARDIOGRAM, TRANSESOPHAGEAL;  Surgeon: Shyrl Linnie KIDD, MD;  Location: [facility] OR;  Service: Open Heart Surgery;  Laterality: N/A;   TONSILLECTOMY     as a child    Prior to Admission medications   Medication Sig Start Date End Date Taking? Authorizing Provider  acetaminophen  (TYLENOL ) 325 MG tablet Take 2 tablets (650 mg total) by mouth every 6 (six) hours as needed for mild pain (pain score 1-3). 10/13/23  Yes Gold, Wayne E, PA-C  albuterol (VENTOLIN HFA) 108 (90 Base) MCG/ACT inhaler Inhale 1 puff into the lungs 2 (two) times daily.   Yes [provider]  aspirin  EC 81 MG tablet Take 1 tablet (81 mg total) by mouth daily. 01/31/24  Yes End, Bruckner, MD  atorvastatin  (LIPITOR) 40 MG tablet TAKE 1 TABLET BY MOUTH EVERY DAY 07/03/23  Yes Wendee Lynwood HERO, NP  Cyanocobalamin  (VITAMIN B 12 PO) Take 1,000 mcg by mouth daily.   Yes [provider]  enalapril  (VASOTEC ) 20 MG tablet TAKE 1 TABLET BY MOUTH EVERY DAY 04/05/23  Yes Wendee Lynwood HERO, NP  Magnesium  250 MG CAPS Take 250 mg by mouth as needed.   Yes [provider]  metFORMIN  (GLUCOPHAGE ) 500 MG tablet TAKE 1 TABLET BY MOUTH TWICE A DAY WITH FOOD 12/27/23  Yes Wendee Lynwood HERO, NP  metoprolol  tartrate (LOPRESSOR ) 25 MG tablet Take 1 tablet (25 mg total) by mouth 2 (two) times daily. 12/02/23  Yes Wittenborn, Barnie, NP  Omega-3 Fatty Acids (FISH OIL PO) Take 1 capsule by mouth as needed.   Yes [provider]  pantoprazole  (PROTONIX ) 40 MG tablet Take 40 mg by mouth daily as needed (heart burn). 07/11/22  Yes [provider]  Potassium Gluconate 550 MG TABS Take 550 mg by mouth as needed.   Yes [provider]  Turmeric (QC TUMERIC COMPLEX PO) Take 1 capsule by mouth as needed.   Yes [provider]  Lancets  (FREESTYLE) lancets Use 1 lancet as directed fingerstick Once a day to test blood sugar for Dx: E11.9 04/14/14   [provider]  nitroGLYCERIN  (NITROSTAT ) 0.4 MG SL tablet Place 0.4 mg under the tongue every 5 (five) minutes as needed for chest pain.    [provider]  sildenafil  (VIAGRA ) 100 MG tablet Take 0.5-1 tablets (50-100 mg total) by mouth daily as needed for erectile dysfunction. 12/26/23   Wendee Lynwood HERO, NP    Allergies as of 01/20/2024   (No Known Allergies)    Family History  Problem Relation Age of Onset   Dementia Mother    Coronary artery disease Father    Melanoma Father    Pancreatic cancer Father    Breast cancer Sister    Breast cancer Sister    Drug abuse Sister    Cancer Maternal Grandmother        unsure of the type   Heart attack Paternal Grandmother    Heart attack Paternal Grandfather     Social History   Socioeconomic History   Marital status: Married    Spouse name: [name]   Number of children: 0   Years of education: [employer] school   Highest education level: Associate degree: occupational, scientist, product/process development, or vocational program  Occupational History   Not on file  Tobacco Use   Smoking status: Former    Current packs/day: 0.00    Average packs/day: 3.5 packs/day for 30.0 years (105.0 ttl pk-yrs)    Types: Cigarettes    Start date: 1963    Quit date: 1993    Years since quitting: 32.9   Smokeless tobacco: Never  Vaping Use   Vaping status: Never Used  Substance and Sexual Activity   Alcohol use: Yes    Comment: occasionally.   Drug use: Never   Sexual activity: Not Currently  Other Topics Concern   Not on file  Social History Narrative   07/30/19   From: the area   Living: with wife, Marval (1986, together since 1981)   Work: Retired, Curator work in 2013      Family: sisters live nearby, and in-laws are in [place]      Enjoys: hunting, fishing, golf (occasionally)      Exercise: daily activities, yard work    Diet: meat - primarily venison, salads, fish, chicken - no red meat      Safety   Seat belts: Yes    Guns: Yes  and not currently secure   Safe in relationships: Yes    Social Drivers of Corporate Investment Banker Strain: Patient Declined (08/26/2023)   Overall Financial Resource Strain (CARDIA)    Difficulty of Paying Living Expenses: Patient declined  Food Insecurity: No Food Insecurity (  10/14/2023)   Hunger Vital Sign    Worried About Running Out of Food in the Last Year: Never true    Ran Out of Food in the Last Year: Never true  Transportation Needs: No Transportation Needs (10/14/2023)   PRAPARE - Administrator, Civil Service (Medical): No    Lack of Transportation (Non-Medical): No  Physical Activity: Inactive (08/26/2023)   Exercise Vital Sign    Days of Exercise per Week: 0 days    Minutes of Exercise per Session: Not on file  Stress: No Stress Concern Present (08/26/2023)   [employer] of Occupational Health - Occupational Stress Questionnaire    Feeling of Stress: Not at all  Social Connections: Socially Integrated (10/10/2023)   Social Connection and Isolation Panel    Frequency of Communication with Friends and Family: Twice a week    Frequency of Social Gatherings with Friends and Family: Twice a week    Attends Religious Services: 1 to 4 times per year    Active Member of [employer] or Organizations: Yes    Attends Banker Meetings: 1 to 4 times per year    Marital Status: Married  Catering Manager Violence: Not At Risk (10/14/2023)   Humiliation, Afraid, Rape, and Kick questionnaire    Fear of Current or Ex-Partner: No    Emotionally Abused: No    Physically Abused: No    Sexually Abused: No    Review of Systems: See HPI, otherwise negative ROS  Physical Exam: BP (!) 120/57   Pulse (!) 57   Temp 98.1 F (36.7 C) (Temporal)   Resp 11   Ht 5' 8 (1.727 m)   Wt 87.5 kg   SpO2 97%   BMI 29.35 kg/m  General:   Alert,  cooperative. Head:  Normocephalic and atraumatic. Respiratory:  Normal work of breathing. Cardiovascular:  NAD  Impression/Plan: Christopher Oconnell is here for cataract surgery.  Risks, benefits, limitations, and alternatives regarding cataract surgery have been reviewed with the patient.  Questions have been answered.  All parties agreeable.   Adine Novak, MD  02/10/2024, 10:07 AM

## 2024-02-10 NOTE — Anesthesia Postprocedure Evaluation (Signed)
 Anesthesia Post Note  Patient: NICKLOS GAXIOLA  Procedure(s) Performed: PHACOEMULSIFICATION, CATARACT, WITH IOL INSERTION 6.11 00:43.4 (Right: Eye)  Patient location during evaluation: PACU Anesthesia Type: MAC Level of consciousness: awake and alert Pain management: pain level controlled Vital Signs Assessment: post-procedure vital signs reviewed and stable Respiratory status: spontaneous breathing, nonlabored ventilation, respiratory function stable and patient connected to nasal cannula oxygen Cardiovascular status: stable and blood pressure returned to baseline Postop Assessment: no apparent nausea or vomiting Anesthetic complications: no   No notable events documented.   Last Vitals:  Vitals:   02/10/24 1036 02/10/24 1043  BP: (!) 98/57 103/66  Pulse: 63 60  Resp: 15 16  Temp: 36.7 C 36.7 C  SpO2: 94% 96%    Last Pain:  Vitals:   02/10/24 1043  TempSrc:   PainSc: 0-No pain                 Yarden Hillis C Tasman Zapata

## 2024-02-10 NOTE — Transfer of Care (Signed)
 Immediate Anesthesia Transfer of Care Note  Patient: Christopher Oconnell  Procedure(s) Performed: PHACOEMULSIFICATION, CATARACT, WITH IOL INSERTION 6.11 00:43.4 (Right: Eye)  Patient Location: PACU  Anesthesia Type: MAC  Level of Consciousness: awake, alert  and patient cooperative  Airway and Oxygen Therapy: Patient Spontanous Breathing and Patient connected to supplemental oxygen  Post-op Assessment: Post-op Vital signs reviewed, Patient's Cardiovascular Status Stable, Respiratory Function Stable, Patent Airway and No signs of Nausea or vomiting  Post-op Vital Signs: Reviewed and stable  Complications: No notable events documented.

## 2024-02-11 ENCOUNTER — Encounter: Payer: Self-pay | Admitting: Ophthalmology

## 2024-02-11 DIAGNOSIS — H2512 Age-related nuclear cataract, left eye: Secondary | ICD-10-CM | POA: Diagnosis not present

## 2024-02-12 NOTE — Anesthesia Preprocedure Evaluation (Addendum)
 Anesthesia Evaluation    Airway Mallampati: III  TM Distance: >3 FB     Dental  (+) Caps Caps No caps in front teeth upper nor lower:  :   Pulmonary former smoker          Cardiovascular hypertension,   08-26-23 echo 1. Left ventricular ejection fraction, by estimation, is 60 to 65%. Left  ventricular ejection fraction by 2D MOD biplane is 60.5 %. The left  ventricle has normal function. The left ventricle has no regional wall  motion abnormalities. Left ventricular  diastolic parameters are consistent with Grade I diastolic dysfunction  (impaired relaxation).   2. Right ventricular systolic function is normal. The right ventricular  size is normal.   3. The mitral valve is normal in structure. Mild mitral valve  regurgitation.   4. The aortic valve is tricuspid. Aortic valve regurgitation is not  visualized. Aortic valve sclerosis/calcification is present, without any  evidence of aortic stenosis. Aortic valve mean gradient measures 6.0 mmHg.   5. The inferior vena cava is normal in size with greater than 50%  respiratory variability, suggesting right atrial pressure of 3 mmHg.    CABG in 10/09/2023 (LIMA-OM and SVG-PDA   01-31-24 cardiac notes copied and pasted: Preoperative cardiovascular risk assessment: Christopher Oconnell is scheduled to undergo cardiac surgery next month.  He is able to complete at least 4 METS of activity and does not have any unstable symptoms.  I think it is reasonable for him to proceed with this low risk surgery without additional cardiac testing or intervention.  If possible, aspirin  81 mg daily should be continued in the perioperative period. It is most likely that above reference to cardiac surgery was, in fact, reference to cataract surgery.     Chart reviewed as part of pre-operative protocol coverage. Cataract extractions are recognized in guidelines as low risk surgeries that do not typically  require specific preoperative testing or holding of blood thinner therapy. Therefore, given past medical history and time since last visit, based on ACC/AHA guidelines, Christopher Oconnell would be at acceptable risk for the planned procedure without further cardiovascular testing.    I will route this recommendation to the requesting party via Epic fax function and remove from pre-op pool.   Please call with questions.   Lamarr Satterfield, NP 01/17/2024, 9:26 AM       Neuro/Psych    GI/Hepatic   Endo/Other  diabetes    Renal/GU      Musculoskeletal   Abdominal   Peds  Hematology   Anesthesia Other Findings Previous cataract surgery 02-10-24 Dr. Ola  Diabetes mellitus without complication Hyperlipidemia Hypertension             Coronary artery disease Asthma             Arthritis GERD (gastroesophageal reflux disease) Basal cell carcinoma Atypical mole    Dysplastic nevus Acne             Dysplastic nevus Dysplastic nevus   ILD (interstitial lung disease)  Skin cancer             Stroke (HCC) S/P CABG x 2        Mild mitral regurgitation by prior echocardiogram Grade I diastolic dysfunction  Coronary artery disease involving native coronary artery of native heart without angina pectoris H/O coronary angioplasty     Reproductive/Obstetrics  Anesthesia Physical Anesthesia Plan  ASA: 3  Anesthesia Plan: MAC   Post-op Pain Management:    Induction: Intravenous  PONV Risk Score and Plan:   Airway Management Planned: Natural Airway and Nasal Cannula  Additional Equipment:   Intra-op Plan:   Post-operative Plan:   Informed Consent: I have reviewed the patients History and Physical, chart, labs and discussed the procedure including the risks, benefits and alternatives for the proposed anesthesia with the patient or authorized representative who has indicated his/her understanding and acceptance.      Dental Advisory Given  Plan Discussed with: Anesthesiologist, CRNA and Surgeon  Anesthesia Plan Comments: (Patient consented for risks of anesthesia including but not limited to:  - adverse reactions to medications - damage to eyes, teeth, lips or other oral mucosa - nerve damage due to positioning  - sore throat or hoarseness - Damage to heart, brain, nerves, lungs, other parts of body or loss of life  Patient voiced understanding and assent.)         Anesthesia Quick Evaluation

## 2024-02-20 NOTE — Discharge Instructions (Signed)

## 2024-02-24 ENCOUNTER — Encounter: Payer: Self-pay | Admitting: Ophthalmology

## 2024-02-24 ENCOUNTER — Ambulatory Visit: Payer: Self-pay | Admitting: Anesthesiology

## 2024-02-24 ENCOUNTER — Ambulatory Visit
Admission: RE | Admit: 2024-02-24 | Discharge: 2024-02-24 | Disposition: A | Attending: Ophthalmology | Admitting: Ophthalmology

## 2024-02-24 ENCOUNTER — Encounter: Admission: RE | Disposition: A | Payer: Self-pay | Source: Home / Self Care | Attending: Ophthalmology

## 2024-02-24 ENCOUNTER — Other Ambulatory Visit: Payer: Self-pay

## 2024-02-24 DIAGNOSIS — E1136 Type 2 diabetes mellitus with diabetic cataract: Secondary | ICD-10-CM | POA: Diagnosis not present

## 2024-02-24 DIAGNOSIS — H2512 Age-related nuclear cataract, left eye: Secondary | ICD-10-CM | POA: Insufficient documentation

## 2024-02-24 DIAGNOSIS — Z87891 Personal history of nicotine dependence: Secondary | ICD-10-CM | POA: Diagnosis not present

## 2024-02-24 DIAGNOSIS — I10 Essential (primary) hypertension: Secondary | ICD-10-CM | POA: Insufficient documentation

## 2024-02-24 DIAGNOSIS — Z951 Presence of aortocoronary bypass graft: Secondary | ICD-10-CM | POA: Diagnosis not present

## 2024-02-24 HISTORY — PX: CATARACT EXTRACTION W/PHACO: SHX586

## 2024-02-24 LAB — GLUCOSE, CAPILLARY: Glucose-Capillary: 116 mg/dL — ABNORMAL HIGH (ref 70–99)

## 2024-02-24 SURGERY — PHACOEMULSIFICATION, CATARACT, WITH IOL INSERTION
Anesthesia: Monitor Anesthesia Care | Laterality: Left

## 2024-02-24 MED ORDER — SIGHTPATH DOSE#1 NA HYALUR & NA CHOND-NA HYALUR IO KIT
PACK | INTRAOCULAR | Status: DC | PRN
  Administered 2024-02-24: 08:00:00 1 via OPHTHALMIC

## 2024-02-24 MED ORDER — PHENYLEPHRINE HCL 10 % OP SOLN
OPHTHALMIC | Status: AC
  Filled 2024-02-24: qty 5

## 2024-02-24 MED ORDER — PHENYLEPHRINE HCL 10 % OP SOLN
1.0000 [drp] | OPHTHALMIC | Status: AC
  Administered 2024-02-24 (×2): 1 [drp] via OPHTHALMIC

## 2024-02-24 MED ORDER — MIDAZOLAM HCL 2 MG/2ML IJ SOLN
INTRAMUSCULAR | Status: AC
  Filled 2024-02-24: qty 2

## 2024-02-24 MED ORDER — CYCLOPENTOLATE HCL 2 % OP SOLN
OPHTHALMIC | Status: AC
  Filled 2024-02-24: qty 2

## 2024-02-24 MED ORDER — SIGHTPATH DOSE#1 BSS IO SOLN
INTRAOCULAR | Status: DC | PRN
  Administered 2024-02-24: 08:00:00 15 mL via INTRAOCULAR

## 2024-02-24 MED ORDER — FENTANYL CITRATE (PF) 100 MCG/2ML IJ SOLN
INTRAMUSCULAR | Status: DC | PRN
  Administered 2024-02-24: 08:00:00 50 ug via INTRAVENOUS

## 2024-02-24 MED ORDER — LACTATED RINGERS IV SOLN: INTRAVENOUS | Status: DC

## 2024-02-24 MED ORDER — TETRACAINE HCL 0.5 % OP SOLN
1.0000 [drp] | OPHTHALMIC | Status: DC | PRN
  Administered 2024-02-24 (×3): 1 [drp] via OPHTHALMIC

## 2024-02-24 MED ORDER — SIGHTPATH DOSE#1 BSS IO SOLN
INTRAOCULAR | Status: DC | PRN
  Administered 2024-02-24: 08:00:00 92 mL via OPHTHALMIC

## 2024-02-24 MED ORDER — MOXIFLOXACIN HCL 0.5 % OP SOLN
OPHTHALMIC | Status: DC | PRN
  Administered 2024-02-24: 08:00:00 .2 mL via OPHTHALMIC

## 2024-02-24 MED ORDER — LIDOCAINE HCL (PF) 2 % IJ SOLN
INTRAOCULAR | Status: DC | PRN
  Administered 2024-02-24: 08:00:00 1 mL via INTRAOCULAR

## 2024-02-24 MED ORDER — FENTANYL CITRATE (PF) 100 MCG/2ML IJ SOLN
INTRAMUSCULAR | Status: AC
  Filled 2024-02-24: qty 2

## 2024-02-24 MED ORDER — MIDAZOLAM HCL (PF) 2 MG/2ML IJ SOLN
INTRAMUSCULAR | Status: DC | PRN
  Administered 2024-02-24: 08:00:00 2 mg via INTRAVENOUS

## 2024-02-24 MED ORDER — TETRACAINE HCL 0.5 % OP SOLN
OPHTHALMIC | Status: AC
  Filled 2024-02-24: qty 4

## 2024-02-24 MED ORDER — CYCLOPENTOLATE HCL 2 % OP SOLN
1.0000 [drp] | OPHTHALMIC | Status: AC
  Administered 2024-02-24 (×2): 1 [drp] via OPHTHALMIC

## 2024-02-24 SURGICAL SUPPLY — 9 items
DISSECTOR HYDRO NUCLEUS 50X22 (MISCELLANEOUS) ×1 IMPLANT
FEE CATARACT SUITE SIGHTPATH (MISCELLANEOUS) ×1 IMPLANT
GLOVE PI ULTRA LF STRL 7.5 (GLOVE) ×1 IMPLANT
GLOVE SURG SYN 6.5 PF PI BL (GLOVE) ×1 IMPLANT
GLOVE SURG SYN 8.5 PF PI BL (GLOVE) ×1 IMPLANT
LENS IOL CLRN PAN 3 22.5 IMPLANT
NDL FILTER BLUNT 18X1 1/2 (NEEDLE) ×1 IMPLANT
NEEDLE FILTER BLUNT 18X1 1/2 (NEEDLE) ×1 IMPLANT
SYR 3ML LL SCALE MARK (SYRINGE) ×1 IMPLANT

## 2024-02-24 NOTE — Transfer of Care (Signed)
 Immediate Anesthesia Transfer of Care Note  Patient: Christopher Oconnell  Procedure(s) Performed: PHACOEMULSIFICATION, CATARACT, WITH IOL INSERTION 4.66, 00:29.3 (Left)  Patient Location: PACU  Anesthesia Type: MAC  Level of Consciousness: awake, alert  and patient cooperative  Airway and Oxygen Therapy: Patient Spontanous Breathing and Patient connected to supplemental oxygen  Post-op Assessment: Post-op Vital signs reviewed, Patient's Cardiovascular Status Stable, Respiratory Function Stable, Patent Airway and No signs of Nausea or vomiting  Post-op Vital Signs: Reviewed and stable  Complications: No notable events documented.

## 2024-02-24 NOTE — Anesthesia Postprocedure Evaluation (Signed)
 Anesthesia Post Note  Patient: Christopher Oconnell  Procedure(s) Performed: PHACOEMULSIFICATION, CATARACT, WITH IOL INSERTION 4.66, 00:29.3 (Left)  Patient location during evaluation: PACU Anesthesia Type: MAC Level of consciousness: awake and alert Pain management: pain level controlled Vital Signs Assessment: post-procedure vital signs reviewed and stable Respiratory status: spontaneous breathing, nonlabored ventilation, respiratory function stable and patient connected to nasal cannula oxygen Cardiovascular status: stable and blood pressure returned to baseline Postop Assessment: no apparent nausea or vomiting Anesthetic complications: no   No notable events documented.   Last Vitals:  Vitals:   02/24/24 0756 02/24/24 0800  BP: 103/62 (!) 119/55  Pulse: (!) 52   Resp: 10   Temp: (!) 36.4 C 36.4 C  SpO2: 96%     Last Pain:  Vitals:   02/24/24 0800  TempSrc:   PainSc: 0-No pain                 Roanna Reaves C [place] Rooke

## 2024-02-24 NOTE — H&P (Signed)
 [facility]   Primary Care Physician:  Wendee Lynwood HERO, NP Ophthalmologist: Dr. Adine Novak  Pre-Procedure History & Physical: HPI:  Christopher Oconnell is a 73 y.o. male here for cataract surgery.   Past Medical History:  Diagnosis Date   Acne    Arthritis    Asthma    Atypical mole 10/19/2020   R med bicep, mod to severe, exc 11/08/20   Basal cell carcinoma 11/05/2019   Right ear above crus. Nodular and infiltrative patterns. Excised 01/19/2020, margins free.   Coronary artery disease    Coronary artery disease involving native coronary artery of native heart without angina pectoris    Diabetes mellitus without complication (HCC)    Dysplastic nevus 11/08/2020   left med bicep at junction of L deltoid, moderate atypia   Dysplastic nevus 09/17/2023   R mid to low back - moderate   Dysplastic nevus 09/17/2023   R sup scapula medial - moderate   GERD (gastroesophageal reflux disease)    Grade I diastolic dysfunction 08/26/2023   H/O coronary angioplasty 1993   to D1   Hyperlipidemia    Hypertension    ILD (interstitial lung disease) (HCC)    NSIP   Mild mitral regurgitation by prior echocardiogram 08/26/2023   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   CABG in 09/2023 (LIMA-OM and SVG-PDA   Skin cancer    Stroke [facility])    MINI STROKE affected the eyes-doubl vision--now ok    Past Surgical History:  Procedure Laterality Date   CATARACT EXTRACTION W/PHACO Right 02/10/2024   Procedure: PHACOEMULSIFICATION, CATARACT, WITH IOL INSERTION 6.11 00:43.4;  Surgeon: Novak Adine Anes, MD;  Location: [facility];  Service: Ophthalmology;  Laterality: Right;   COLONOSCOPY     CORONARY ANGIOPLASTY  1993   No stents   CORONARY ARTERY BYPASS GRAFT N/A 10/09/2023   Procedure: CORONARY ARTERY BYPASS GRAFTING (CABG) TIMES TWO USING LEFT INTERNAL MAMMARY ARTERY AND ENDOSCOPICALLY HARVESTED RIGHT GREATER SAPHENOUS VEIN;  Surgeon: Shyrl Linnie KIDD, MD;  Location: [facility] OR;  Service: Open Heart  Surgery;  Laterality: N/A;   LEFT HEART CATH AND CORONARY ANGIOGRAPHY N/A 09/02/2023   Procedure: LEFT HEART CATH AND CORONARY ANGIOGRAPHY;  Surgeon: Mady Bruckner, MD;  Location: [facility] INVASIVE CV LAB;  Service: Cardiovascular;  Laterality: N/A;   NOSE SURGERY     broken nose and later re-set   SKIN CANCER EXCISION  01/2020   right ear    TEE WITHOUT CARDIOVERSION N/A 10/09/2023   Procedure: ECHOCARDIOGRAM, TRANSESOPHAGEAL;  Surgeon: Shyrl Linnie KIDD, MD;  Location: [facility] OR;  Service: Open Heart Surgery;  Laterality: N/A;   TONSILLECTOMY     as a child    Prior to Admission medications  Medication Sig Start Date End Date Taking? Authorizing Provider  acetaminophen  (TYLENOL ) 325 MG tablet Take 2 tablets (650 mg total) by mouth every 6 (six) hours as needed for mild pain (pain score 1-3). 10/13/23  Yes Gold, Wayne E, PA-C  albuterol (VENTOLIN HFA) 108 (90 Base) MCG/ACT inhaler Inhale 1 puff into the lungs 2 (two) times daily.   Yes [provider]  aspirin  EC 81 MG tablet Take 1 tablet (81 mg total) by mouth daily. 01/31/24  Yes End, Bruckner, MD  atorvastatin  (LIPITOR) 40 MG tablet TAKE 1 TABLET BY MOUTH EVERY DAY 07/03/23  Yes Wendee Lynwood HERO, NP  Cyanocobalamin  (VITAMIN B 12 PO) Take 1,000 mcg by mouth daily.   Yes [provider]  enalapril  (VASOTEC ) 20 MG tablet  TAKE 1 TABLET BY MOUTH EVERY DAY 04/05/23  Yes Wendee Lynwood HERO, NP  Lancets (FREESTYLE) lancets Use 1 lancet as directed fingerstick Once a day to test blood sugar for Dx: E11.9 04/14/14  Yes [provider]  Magnesium  250 MG CAPS Take 250 mg by mouth as needed.   Yes [provider]  metFORMIN  (GLUCOPHAGE ) 500 MG tablet TAKE 1 TABLET BY MOUTH TWICE A DAY WITH FOOD 12/27/23  Yes Wendee Lynwood HERO, NP  metoprolol  tartrate (LOPRESSOR ) 25 MG tablet Take 1 tablet (25 mg total) by mouth 2 (two) times daily. 12/02/23  Yes Wittenborn, Barnie, NP  Omega-3 Fatty Acids (FISH OIL PO) Take 1 capsule by mouth as  needed.   Yes [provider]  pantoprazole  (PROTONIX ) 40 MG tablet Take 40 mg by mouth daily as needed (heart burn). 07/11/22  Yes [provider]  Potassium Gluconate 550 MG TABS Take 550 mg by mouth as needed.   Yes [provider]  sildenafil  (VIAGRA ) 100 MG tablet Take 0.5-1 tablets (50-100 mg total) by mouth daily as needed for erectile dysfunction. 12/26/23  Yes Wendee Lynwood HERO, NP  Turmeric (QC TUMERIC COMPLEX PO) Take 1 capsule by mouth as needed.   Yes [provider]  nitroGLYCERIN  (NITROSTAT ) 0.4 MG SL tablet Place 0.4 mg under the tongue every 5 (five) minutes as needed for chest pain.    [provider]    Allergies as of 01/20/2024   (No Known Allergies)    Family History  Problem Relation Age of Onset   Dementia Mother    Coronary artery disease Father    Melanoma Father    Pancreatic cancer Father    Breast cancer Sister    Breast cancer Sister    Drug abuse Sister    Cancer Maternal Grandmother        unsure of the type   Heart attack Paternal Grandmother    Heart attack Paternal Grandfather     Social History   Socioeconomic History   Marital status: Married    Spouse name: [name]   Number of children: 0   Years of education: [employer] school   Highest education level: Associate degree: occupational, scientist, product/process development, or vocational program  Occupational History   Not on file  Tobacco Use   Smoking status: Former    Current packs/day: 0.00    Average packs/day: 3.5 packs/day for 30.0 years (105.0 ttl pk-yrs)    Types: Cigarettes    Start date: 1963    Quit date: 1993    Years since quitting: 32.9   Smokeless tobacco: Never  Vaping Use   Vaping status: Never Used  Substance and Sexual Activity   Alcohol use: Yes    Comment: occasionally.   Drug use: Never   Sexual activity: Not Currently  Other Topics Concern   Not on file  Social History Narrative   07/30/19   From: the area   Living: with wife, Marval  (1986, together since 1981)   Work: Retired, Curator work in 2013      Family: sisters live nearby, and in-laws are in [place]      Enjoys: hunting, fishing, golf (occasionally)      Exercise: daily activities, yard work   Diet: meat - primarily venison, salads, fish, chicken - no red meat      Safety   Seat belts: Yes    Guns: Yes  and not currently secure   Safe in relationships: Yes    Social Drivers  of Health   Tobacco Use: Medium Risk (02/24/2024)   Patient History    Smoking Tobacco Use: Former    Smokeless Tobacco Use: Never    Passive Exposure: Not on file  Financial Resource Strain: Patient Declined (08/26/2023)   Overall Financial Resource Strain (CARDIA)    Difficulty of Paying Living Expenses: Patient declined  Food Insecurity: No Food Insecurity (10/14/2023)   Epic    Worried About Programme Researcher, Broadcasting/film/video in the Last Year: Never true    Ran Out of Food in the Last Year: Never true  Transportation Needs: No Transportation Needs (10/14/2023)   Epic    Lack of Transportation (Medical): No    Lack of Transportation (Non-Medical): No  Physical Activity: Inactive (08/26/2023)   Exercise Vital Sign    Days of Exercise per Week: 0 days    Minutes of Exercise per Session: Not on file  Stress: No Stress Concern Present (08/26/2023)   [employer] of Occupational Health - Occupational Stress Questionnaire    Feeling of Stress: Not at all  Social Connections: Socially Integrated (10/10/2023)   Social Connection and Isolation Panel    Frequency of Communication with Friends and Family: Twice a week    Frequency of Social Gatherings with Friends and Family: Twice a week    Attends Religious Services: 1 to 4 times per year    Active Member of [employer] or Organizations: Yes    Attends Banker Meetings: 1 to 4 times per year    Marital Status: Married  Catering Manager Violence: Not At Risk (10/14/2023)   Epic    Fear of Current or Ex-Partner: No    Emotionally Abused:  No    Physically Abused: No    Sexually Abused: No  Depression (PHQ2-9): Low Risk (12/25/2023)   Depression (PHQ2-9)    PHQ-2 Score: 0  Alcohol Screen: Low Risk (05/03/2023)   Alcohol Screen    Last Alcohol Screening Score (AUDIT): 0  Housing: Unknown (01/30/2024)   Received from [facility]   Epic    Unable to Pay for Housing in the Last Year: Not on file    Number of Times Moved in the Last Year: Not on file    At any time in the past 12 months, were you homeless or living in a shelter (including now)?: No  Utilities: Not At Risk (10/14/2023)   Epic    Threatened with loss of utilities: No  Health Literacy: Adequate Health Literacy (05/03/2023)   B1300 Health Literacy    Frequency of need for help with medical instructions: Never    Review of Systems: See HPI, otherwise negative ROS  Physical Exam: BP 137/66   Pulse (!) 55   Temp 98.5 F (36.9 C) (Temporal)   Resp 13   Ht 5' 8 (1.727 m)   Wt 87.6 kg   SpO2 97%   BMI 29.36 kg/m  General:   Alert, cooperative. Head:  Normocephalic and atraumatic. Respiratory:  Normal work of breathing. Cardiovascular:  NAD  Impression/Plan: Christopher Oconnell is here for cataract surgery.  Risks, benefits, limitations, and alternatives regarding cataract surgery have been reviewed with the patient.  Questions have been answered.  All parties agreeable.   Adine Novak, MD  02/24/2024, 7:13 AM

## 2024-02-24 NOTE — Op Note (Signed)
 OPERATIVE NOTE  Christopher Oconnell [id number] 02/24/2024   PREOPERATIVE DIAGNOSIS:  Nuclear sclerotic cataract left eye.  H25.12   POSTOPERATIVE DIAGNOSIS:    Nuclear sclerotic cataract left eye.     PROCEDURE:  Phacoemusification with posterior chamber intraocular lens placement of the left eye   LENS:   Implant Name Type Inv. Item Serial No. Manufacturer Lot No. LRB No. Used Action  LENS IOL CLRN PAN 3 22.5 - [id number]  LENS IOL CLRN PAN 3 22.5 [id number] SIGHTPATH  Left 1 Implanted      Procedures: PHACOEMULSIFICATION, CATARACT, WITH IOL INSERTION 4.66, 00:29.3 (Left)  Panoptix Toric +22.5 [id number]  SURGEON:  Adine Novak, MD, MPH   ANESTHESIA:  Topical with tetracaine  drops augmented with 1% preservative-free intracameral lidocaine .  ESTIMATED BLOOD LOSS: <1 mL   COMPLICATIONS:  None.   DESCRIPTION OF PROCEDURE:  The patient was identified in the holding room and transported to the operating room and placed in the supine position under the operating microscope.  The left eye was identified as the operative eye and it was prepped and draped in the usual sterile ophthalmic fashion.  The verion system was registered without difficulty.   A 1.0 millimeter clear-corneal paracentesis was made at the 5:00 position. 0.5 ml of preservative-free 1% lidocaine  with epinephrine  was injected into the anterior chamber.  The anterior chamber was filled with viscoelastic.  A 2.4 millimeter keratome was used to make a near-clear corneal incision at the 2:00 position.  A curvilinear capsulorrhexis was made with a cystotome and capsulorrhexis forceps.  Balanced salt solution was used to hydrodissect and hydrodelineate the nucleus.   Phacoemulsification was then used in stop and chop fashion to remove the lens nucleus and epinucleus.  The remaining cortex was then removed using the irrigation and aspiration handpiece. Viscoelastic was then placed into the capsular bag to distend it for lens  placement.  A lens was then injected into the capsular bag.  The remaining viscoelastic was aspirated.  The lens was rotated with guidance from the verion system.   Wounds were hydrated with balanced salt solution.  The anterior chamber was inflated to a physiologic pressure with balanced salt solution.  Intracameral vigamox  0.1 mL undiltued was injected into the eye and a drop placed onto the ocular surface.  No wound leaks were noted.  The patient was taken to the recovery room in stable condition without complications of anesthesia or surgery  Adine Novak 02/24/2024, 7:53 AM

## 2024-03-24 NOTE — Progress Notes (Unsigned)
 "    Christopher Slaydon T. Glayds Insco, MD, CAQ Sports Medicine [facility] [address] [place], [address]  Phone: [phone]  FAX: [phone]  Christopher Oconnell - 74 y.o. male  MRN [id number]  Date of Birth: 11-Jul-1950  Date: 03/25/2024  PCP: Wendee Lynwood HERO, NP  Referral: Wendee Lynwood HERO, NP  No chief complaint on file.  Subjective:   Christopher Oconnell is a 74 y.o. very pleasant male patient with There is no height or weight on file to calculate BMI. who presents with the following:  Discussed the use of AI scribe software for clinical note transcription with the patient, who gave verbal consent to proceed.  Christopher Oconnell is here for left-sided frozen shoulder follow-up.  The last time I saw him, he had marked restriction of motion in all directions.  I had him start [facility] frozen shoulder protocol, and we also did an intra-articular injection. History of Present Illness     Review of Systems is noted in the HPI, as appropriate  Objective:   There were no vitals taken for this visit.  GEN: No acute distress; alert,appropriate. PULM: Breathing comfortably in no respiratory distress PSYCH: Normally interactive.   Laboratory and Imaging Data:  Assessment and Plan:   No diagnosis found. Assessment & Plan   Medication Management during today's office visit: No orders of the defined types were placed in this encounter.  There are no discontinued medications.  Orders placed today for conditions managed today: No orders of the defined types were placed in this encounter.   Disposition: No follow-ups on file.  Dragon [employer] One speech-to-text software was used for transcription in this dictation.  Possible transcriptional errors can occur using Animal nutritionist.   Signed,  Jacques DASEN. Queenie Aufiero, MD   Outpatient Encounter Medications as of 03/25/2024  Medication Sig   acetaminophen  (TYLENOL ) 325 MG tablet Take 2 tablets (650 mg total) by  mouth every 6 (six) hours as needed for mild pain (pain score 1-3).   albuterol (VENTOLIN HFA) 108 (90 Base) MCG/ACT inhaler Inhale 1 puff into the lungs 2 (two) times daily.   aspirin  EC 81 MG tablet Take 1 tablet (81 mg total) by mouth daily.   atorvastatin  (LIPITOR) 40 MG tablet TAKE 1 TABLET BY MOUTH EVERY DAY   Cyanocobalamin  (VITAMIN B 12 PO) Take 1,000 mcg by mouth daily.   enalapril  (VASOTEC ) 20 MG tablet TAKE 1 TABLET BY MOUTH EVERY DAY   Lancets (FREESTYLE) lancets Use 1 lancet as directed fingerstick Once a day to test blood sugar for Dx: E11.9   Magnesium  250 MG CAPS Take 250 mg by mouth as needed.   metFORMIN  (GLUCOPHAGE ) 500 MG tablet TAKE 1 TABLET BY MOUTH TWICE A DAY WITH FOOD   metoprolol  tartrate (LOPRESSOR ) 25 MG tablet Take 1 tablet (25 mg total) by mouth 2 (two) times daily.   nitroGLYCERIN  (NITROSTAT ) 0.4 MG SL tablet Place 0.4 mg under the tongue every 5 (five) minutes as needed for chest pain.   Omega-3 Fatty Acids (FISH OIL PO) Take 1 capsule by mouth as needed.   pantoprazole  (PROTONIX ) 40 MG tablet Take 40 mg by mouth daily as needed (heart burn).   Potassium Gluconate 550 MG TABS Take 550 mg by mouth as needed.   sildenafil  (VIAGRA ) 100 MG tablet Take 0.5-1 tablets (50-100 mg total) by mouth daily as needed for erectile dysfunction.   Turmeric (QC TUMERIC COMPLEX PO) Take 1 capsule by mouth as needed.   No  facility-administered encounter medications on file as of 03/25/2024.   "

## 2024-03-25 ENCOUNTER — Ambulatory Visit: Admitting: Family Medicine

## 2024-03-25 ENCOUNTER — Encounter: Payer: Self-pay | Admitting: Family Medicine

## 2024-03-25 VITALS — BP 128/64 | HR 82 | Temp 98.2°F | Ht 68.0 in | Wt 198.1 lb

## 2024-03-25 DIAGNOSIS — M7502 Adhesive capsulitis of left shoulder: Secondary | ICD-10-CM

## 2024-03-26 ENCOUNTER — Ambulatory Visit: Admitting: Nurse Practitioner

## 2024-04-04 ENCOUNTER — Other Ambulatory Visit: Payer: Self-pay | Admitting: Nurse Practitioner

## 2024-05-04 ENCOUNTER — Ambulatory Visit: Payer: PPO

## 2024-05-27 ENCOUNTER — Ambulatory Visit: Admitting: Family Medicine

## 2024-06-12 ENCOUNTER — Encounter: Admitting: Nurse Practitioner

## 2024-06-18 ENCOUNTER — Encounter: Admitting: Nurse Practitioner

## 2024-09-22 ENCOUNTER — Ambulatory Visit: Admitting: Dermatology
